# Patient Record
Sex: Male | Born: 1965 | Race: Black or African American | Hispanic: No | Marital: Married | Smoking: Current some day smoker
Health system: Southern US, Community
[De-identification: ages and names within clinical notes are randomized; demographics above are authoritative.]

## PROBLEM LIST (undated history)

## (undated) DIAGNOSIS — Z803 Family history of malignant neoplasm of breast: Secondary | ICD-10-CM

## (undated) DIAGNOSIS — C259 Malignant neoplasm of pancreas, unspecified: Secondary | ICD-10-CM

## (undated) DIAGNOSIS — E119 Type 2 diabetes mellitus without complications: Secondary | ICD-10-CM

## (undated) DIAGNOSIS — I10 Essential (primary) hypertension: Secondary | ICD-10-CM

## (undated) DIAGNOSIS — G893 Neoplasm related pain (acute) (chronic): Secondary | ICD-10-CM

## (undated) DIAGNOSIS — K219 Gastro-esophageal reflux disease without esophagitis: Secondary | ICD-10-CM

## (undated) DIAGNOSIS — N200 Calculus of kidney: Secondary | ICD-10-CM

## (undated) DIAGNOSIS — Z87442 Personal history of urinary calculi: Secondary | ICD-10-CM

## (undated) DIAGNOSIS — J45909 Unspecified asthma, uncomplicated: Secondary | ICD-10-CM

## (undated) HISTORY — PX: OTHER SURGICAL HISTORY: SHX169

## (undated) HISTORY — DX: Neoplasm related pain (acute) (chronic): G89.3

## (undated) HISTORY — PX: ROTATOR CUFF REPAIR: SHX139

## (undated) HISTORY — DX: Calculus of kidney: N20.0

## (undated) HISTORY — PX: FRACTURE SURGERY: SHX138

## (undated) HISTORY — DX: Unspecified asthma, uncomplicated: J45.909

## (undated) HISTORY — DX: Family history of malignant neoplasm of breast: Z80.3

---

## 2005-06-16 ENCOUNTER — Emergency Department: Payer: Self-pay | Admitting: Emergency Medicine

## 2005-06-18 ENCOUNTER — Ambulatory Visit: Payer: Self-pay | Admitting: Emergency Medicine

## 2007-06-01 ENCOUNTER — Emergency Department: Payer: Self-pay | Admitting: Emergency Medicine

## 2010-08-03 ENCOUNTER — Emergency Department: Payer: Self-pay | Admitting: Emergency Medicine

## 2011-10-30 ENCOUNTER — Ambulatory Visit: Payer: Self-pay | Admitting: Urology

## 2012-11-15 ENCOUNTER — Emergency Department (HOSPITAL_COMMUNITY): Payer: 59

## 2012-11-15 ENCOUNTER — Encounter (HOSPITAL_COMMUNITY): Payer: Self-pay

## 2012-11-15 ENCOUNTER — Emergency Department (HOSPITAL_COMMUNITY)
Admission: EM | Admit: 2012-11-15 | Discharge: 2012-11-15 | Disposition: A | Discharge status: Home / Self Care | Payer: 59 | Attending: Emergency Medicine | Admitting: Emergency Medicine

## 2012-11-15 DIAGNOSIS — F17200 Nicotine dependence, unspecified, uncomplicated: Secondary | ICD-10-CM | POA: Insufficient documentation

## 2012-11-15 DIAGNOSIS — R059 Cough, unspecified: Secondary | ICD-10-CM | POA: Insufficient documentation

## 2012-11-15 DIAGNOSIS — Z79899 Other long term (current) drug therapy: Secondary | ICD-10-CM | POA: Insufficient documentation

## 2012-11-15 DIAGNOSIS — R509 Fever, unspecified: Secondary | ICD-10-CM | POA: Insufficient documentation

## 2012-11-15 DIAGNOSIS — J069 Acute upper respiratory infection, unspecified: Secondary | ICD-10-CM | POA: Insufficient documentation

## 2012-11-15 DIAGNOSIS — J3489 Other specified disorders of nose and nasal sinuses: Secondary | ICD-10-CM | POA: Insufficient documentation

## 2012-11-15 DIAGNOSIS — R05 Cough: Secondary | ICD-10-CM | POA: Insufficient documentation

## 2012-11-15 DIAGNOSIS — I10 Essential (primary) hypertension: Secondary | ICD-10-CM | POA: Insufficient documentation

## 2012-11-15 DIAGNOSIS — K219 Gastro-esophageal reflux disease without esophagitis: Secondary | ICD-10-CM | POA: Insufficient documentation

## 2012-11-15 HISTORY — DX: Essential (primary) hypertension: I10

## 2012-11-15 HISTORY — DX: Gastro-esophageal reflux disease without esophagitis: K21.9

## 2012-11-15 MED ORDER — GUAIFENESIN 100 MG/5ML PO SYRP
200.0000 mg | ORAL_SOLUTION | ORAL | Status: DC | PRN
  Filled 2012-11-15: qty 118

## 2012-11-15 MED ORDER — HYDROCODONE-HOMATROPINE 5-1.5 MG/5ML PO SYRP: 5.0000 mL | ORAL_SOLUTION | Freq: Four times a day (QID) | ORAL | Status: DC | PRN

## 2012-11-15 MED ORDER — GUAIFENESIN 100 MG/5ML PO SOLN
10.0000 mL | ORAL | Status: DC | PRN
  Administered 2012-11-15: 200 mg via ORAL
  Filled 2012-11-15: qty 10

## 2012-11-15 NOTE — ED Provider Notes (Signed)
Medical screening examination/treatment/procedure(s) were performed by non-physician practitioner and as supervising physician I was immediately available for consultation/collaboration.  Ethanael Veith, MD 11/15/12 1552 

## 2012-11-15 NOTE — ED Provider Notes (Signed)
History     CSN: [id number]  Arrival date & time 11/15/12  1204   First MD Initiated Contact with Patient 11/15/12 1234      Chief Complaint  Patient presents with  . Sore Throat  . Cough    (Consider location/radiation/quality/duration/timing/severity/associated sxs/prior treatment) Patient is a 47 y.o. male presenting with URI. The history is provided by the patient.  URI Presenting symptoms: congestion, cough, fever (tactile, no temp taken ), rhinorrhea and sore throat   Presenting symptoms: no ear pain and no facial pain   Severity:  Moderate Onset quality:  Gradual Duration:  2 days Timing:  Constant Progression:  Unchanged Chronicity:  New Relieved by:  Nothing Worsened by:  Nothing tried Ineffective treatments:  None tried Associated symptoms: no arthralgias, no headaches, no myalgias, no neck pain, no sinus pain, no sneezing, no swollen glands and no wheezing   Risk factors: sick contacts   Risk factors: no chronic kidney disease, no diabetes mellitus, no immunosuppression, no recent illness and no recent travel     Past Medical History  Diagnosis Date  . Hypertension   . Acid reflux     Past Surgical History  Procedure Laterality Date  . Fracture surgery    . Pelvis fractur      Family History  Problem Relation Age of Onset  . Cancer Sister   . Diabetes Brother   . Hypertension Brother     History  Substance Use Topics  . Smoking status: Current Every Day Smoker -- 1.00 packs/day    Types: Cigarettes  . Smokeless tobacco: Never Used  . Alcohol Use: Yes     Comment: weekends only      Review of Systems  Constitutional: Positive for fever (tactile, no temp taken ).  HENT: Positive for congestion, sore throat and rhinorrhea. Negative for ear pain, sneezing and neck pain.   Respiratory: Positive for cough. Negative for wheezing.   Musculoskeletal: Negative for myalgias and arthralgias.  Neurological: Negative for headaches.  All other systems  reviewed and are negative.    Allergies  Review of patient's allergies indicates no known allergies.  Home Medications   Current Outpatient Rx  Name  Route  Sig  Dispense  Refill  . ibuprofen (ADVIL,MOTRIN) 200 MG tablet   Oral   Take 800 mg by mouth daily as needed for pain.         Marland Kitchen omeprazole (PRILOSEC) 20 MG capsule   Oral   Take 20 mg by mouth daily.           BP 158/112  Pulse 87  Temp(Src) 98.3 F (36.8 C) (Oral)  Resp 16  SpO2 95%  Physical Exam  Nursing note and vitals reviewed. Constitutional: He is oriented to person, place, and time. He appears well-developed and well-nourished. No distress.  Hypertension  HENT:  Head: Normocephalic and atraumatic.  Nasal congestion & rhinorrhea. No ttp over frontal & maxilla sinuses. Normal L & R external ear canals and TMs. No tragal or mastoid tenderness. Oropharynx moist, without tonsillar exudate.   Eyes: Pupils are equal, round, and reactive to light.  No pain w EOM. Conjunctiva normal    Neck: Normal range of motion.  Soft, no nuchal rigidity. No lymphadenopathy  Cardiovascular: Normal heart sounds and intact distal pulses.   RRR, no aberrancy on auscultation  Pulmonary/Chest: Effort normal.  LCAB, no respiratory distress  Musculoskeletal: Normal range of motion.  Neurological: He is alert and oriented to person, place, and time.  Skin: He is not diaphoretic.  No rash  Psychiatric: His behavior is normal.    ED Course  Procedures (including critical care time)  Labs Reviewed - No data to display Dg Chest 2 View  11/15/2012  *RADIOLOGY REPORT*  Clinical Data: Cough, chest pain and fever  CHEST - 2 VIEW  Comparison: None.  Findings: The heart and pulmonary vascularity are within normal limits.  The lungs are clear bilaterally.  No pneumothorax or effusion is noted.  No acute abnormalities seen.  IMPRESSION: No acute abnormality noted.   Original Report Authenticated By: Alcide Clever, M.D.      No  diagnosis found.    MDM  Pt CXR negative for acute infiltrate. Patients symptoms are consistent with URI, likely viral etiology. Discussed that antibiotics are not indicated for viral infections. Pt will be discharged with symptomatic treatment.  Verbalizes understanding and is agreeable with plan. Pt is hemodynamically stable & in NAD prior to dc.Discussed pts high blood pressure and advised re-check this week with PCP         Jaci Carrel, PA-C 11/15/12 1308

## 2012-11-15 NOTE — ED Notes (Signed)
Patient reports that he has a sore throat and a Productive cough with green sputum. Patient reports that he has had chills.

## 2012-11-15 NOTE — ED Notes (Signed)
Pt states that he is suppose to take BP medications but is not currently taken them at them moment says the medications makes him weak.

## 2013-05-18 ENCOUNTER — Emergency Department (HOSPITAL_COMMUNITY): Payer: 59

## 2013-05-18 ENCOUNTER — Encounter (HOSPITAL_COMMUNITY): Payer: Self-pay | Admitting: Emergency Medicine

## 2013-05-18 ENCOUNTER — Emergency Department (HOSPITAL_COMMUNITY)
Admission: EM | Admit: 2013-05-18 | Discharge: 2013-05-18 | Disposition: A | Discharge status: Home / Self Care | Payer: 59 | Attending: Emergency Medicine | Admitting: Emergency Medicine

## 2013-05-18 DIAGNOSIS — M545 Low back pain, unspecified: Secondary | ICD-10-CM | POA: Insufficient documentation

## 2013-05-18 DIAGNOSIS — F17200 Nicotine dependence, unspecified, uncomplicated: Secondary | ICD-10-CM | POA: Insufficient documentation

## 2013-05-18 DIAGNOSIS — I10 Essential (primary) hypertension: Secondary | ICD-10-CM | POA: Insufficient documentation

## 2013-05-18 DIAGNOSIS — Z79899 Other long term (current) drug therapy: Secondary | ICD-10-CM | POA: Insufficient documentation

## 2013-05-18 DIAGNOSIS — K219 Gastro-esophageal reflux disease without esophagitis: Secondary | ICD-10-CM | POA: Insufficient documentation

## 2013-05-18 LAB — COMPREHENSIVE METABOLIC PANEL
AST: 26 U/L (ref 0–37)
Albumin: 3.8 g/dL (ref 3.5–5.2)
BUN: 13 mg/dL (ref 6–23)
Chloride: 102 mEq/L (ref 96–112)
Creatinine, Ser: 0.8 mg/dL (ref 0.50–1.35)
Potassium: 3.7 mEq/L (ref 3.5–5.1)
Total Bilirubin: 0.3 mg/dL (ref 0.3–1.2)
Total Protein: 7.1 g/dL (ref 6.0–8.3)

## 2013-05-18 LAB — CBC WITH DIFFERENTIAL/PLATELET
Basophils Absolute: 0 10*3/uL (ref 0.0–0.1)
Basophils Relative: 0 % (ref 0–1)
Eosinophils Absolute: 0.3 10*3/uL (ref 0.0–0.7)
MCH: 28.4 pg (ref 26.0–34.0)
MCHC: 35.2 g/dL (ref 30.0–36.0)
Monocytes Absolute: 0.7 10*3/uL (ref 0.1–1.0)
Neutro Abs: 2.9 10*3/uL (ref 1.7–7.7)
Neutrophils Relative %: 44 % (ref 43–77)
RDW: 13.4 % (ref 11.5–15.5)

## 2013-05-18 LAB — URINALYSIS, ROUTINE W REFLEX MICROSCOPIC
Bilirubin Urine: NEGATIVE
Ketones, ur: NEGATIVE mg/dL
Leukocytes, UA: NEGATIVE
Nitrite: NEGATIVE
Protein, ur: NEGATIVE mg/dL

## 2013-05-18 LAB — LIPASE, BLOOD: Lipase: 58 U/L (ref 11–59)

## 2013-05-18 MED ORDER — FENTANYL CITRATE 0.05 MG/ML IJ SOLN
50.0000 ug | INTRAMUSCULAR | Status: DC | PRN
  Administered 2013-05-18: 50 ug via INTRAVENOUS
  Filled 2013-05-18: qty 2

## 2013-05-18 MED ORDER — CYCLOBENZAPRINE HCL 10 MG PO TABS
5.0000 mg | ORAL_TABLET | Freq: Once | ORAL | Status: AC
  Administered 2013-05-18: 5 mg via ORAL
  Filled 2013-05-18: qty 1

## 2013-05-18 MED ORDER — IBUPROFEN 800 MG PO TABS: 800.0000 mg | ORAL_TABLET | Freq: Three times a day (TID) | ORAL | Status: DC

## 2013-05-18 MED ORDER — CYCLOBENZAPRINE HCL 10 MG PO TABS: 10.0000 mg | ORAL_TABLET | Freq: Two times a day (BID) | ORAL | Status: DC | PRN

## 2013-05-18 MED ORDER — TRAMADOL HCL 50 MG PO TABS
50.0000 mg | ORAL_TABLET | Freq: Four times a day (QID) | ORAL | Status: DC | PRN
Start: 2013-05-18 — End: 2014-05-17

## 2013-05-18 NOTE — ED Notes (Signed)
Pt presents to ED with c/o bilateral flank pain that radiates down to both legs; pt reports that he woke up yesterday with sudden pain that is getting progressively worse, "worst" pain at this time; pain also gets worse on movement, especially when bending. Pt denies nausea or vomiting. Pt reports hx of kidney stones. Pt also c/o lower abdominal pain; reports that he has been taking Doxycycline (for a course of 10 days) for STD since last week.

## 2013-05-18 NOTE — ED Provider Notes (Signed)
CSN: [id number]     Arrival date & time 05/18/13  0037 History   First MD Initiated Contact with Patient 05/18/13 [phone]     Chief Complaint  Patient presents with  . Flank Pain   (Consider location/radiation/quality/duration/timing/severity/associated sxs/prior Treatment) HPI History provided by patient. Right-sided lower back pain radiating down right leg onset about a week ago. Pain sharp/ moderate in severity and worse with bending or movement. No associated weakness or numbness. No hematuria. No flank pain. No abdominal pain. No fevers or chills. No associated nausea vomiting or diarrhea. Patient is currently taking doxycycline for possible STD - he was evaluated at an urgent care for dysuria which is improving. No penile discharge. No rectal pain.  Past Medical History  Diagnosis Date  . Hypertension   . Acid reflux    Past Surgical History  Procedure Laterality Date  . Fracture surgery    . Pelvis fractur     Family History  Problem Relation Age of Onset  . Cancer Sister   . Diabetes Brother   . Hypertension Brother    History  Substance Use Topics  . Smoking status: Current Every Day Smoker -- 1.00 packs/day    Types: Cigarettes  . Smokeless tobacco: Never Used  . Alcohol Use: Yes     Comment: weekends only    Review of Systems  Constitutional: Negative for fever and chills.  HENT: Negative for neck pain and neck stiffness.   Eyes: Negative for pain.  Respiratory: Negative for shortness of breath.   Cardiovascular: Negative for chest pain.  Gastrointestinal: Negative for abdominal pain.  Genitourinary: Negative for flank pain.  Musculoskeletal: Positive for back pain.  Skin: Negative for rash.  Neurological: Negative for headaches.  All other systems reviewed and are negative.    Allergies  Review of patient's allergies indicates no known allergies.  Home Medications   Current Outpatient Rx  Name  Route  Sig  Dispense  Refill  . amLODipine (NORVASC) 10  MG tablet   Oral   Take 10 mg by mouth daily.         Marland Kitchen doxycycline (VIBRA-TABS) 100 MG tablet   Oral   Take 100 mg by mouth 2 (two) times daily.         Marland Kitchen ibuprofen (ADVIL,MOTRIN) 200 MG tablet   Oral   Take 800 mg by mouth daily as needed for pain.         Marland Kitchen omeprazole (PRILOSEC) 20 MG capsule   Oral   Take 20 mg by mouth daily.          BP 155/97  Pulse 84  Temp(Src) 98.3 F (36.8 C) (Oral)  Resp 20  Ht 6' (1.829 m)  Wt 230 lb (104.327 kg)  BMI 31.19 kg/m2  SpO2 98% Physical Exam  Constitutional: He is oriented to person, place, and time. He appears well-developed and well-nourished.  HENT:  Head: Normocephalic and atraumatic.  Eyes: EOM are normal. Pupils are equal, round, and reactive to light.  Neck: Neck supple.  Cardiovascular: Normal rate, regular rhythm and intact distal pulses.   Pulmonary/Chest: Effort normal and breath sounds normal. No respiratory distress.  Abdominal: Soft. Bowel sounds are normal. He exhibits no distension. There is no tenderness.  Musculoskeletal: Normal range of motion. He exhibits no edema.  Tender right para lumbar region without midline tenderness or deformity. No CVA tenderness. No lower extremity deficits with equal dorsi plantar flexion, hip flexion and knee extension. Sensorium to light touch equal and  intact throughout. Equal dorsalis pedis pulses  Neurological: He is alert and oriented to person, place, and time.  Skin: Skin is warm and dry.    ED Course  Procedures (including critical care time) Labs Review Labs Reviewed  COMPREHENSIVE METABOLIC PANEL - Abnormal; Notable for the following:    Glucose, Bld 125 (*)    All other components within normal limits  URINALYSIS, ROUTINE W REFLEX MICROSCOPIC  CBC WITH DIFFERENTIAL  LIPASE, BLOOD   Imaging Review Ct Abdomen Pelvis Wo Contrast  05/18/2013   CLINICAL DATA:  Bilateral flank and lower abdominal pain. History of urinary tract stones.  EXAM: CT ABDOMEN AND  PELVIS WITHOUT CONTRAST  TECHNIQUE: Multidetector CT imaging of the abdomen and pelvis was performed following the standard protocol without intravenous contrast.  COMPARISON:  None.  FINDINGS: The lung bases are clear.  No pleural or pericardial effusion.  There are no renal or ureteral stones on the right or left. The kidneys have a normal uninfused appearance. The gallbladder, liver, spleen, adrenal glands and pancreas appear normal. The urinary bladder, prostate gland and seminal vesicles are unremarkable. There is few colonic diverticula without diverticulitis. The stomach, small bowel and appendix appear normal. No lymphadenopathy or fluid is identified. There is no focal bony abnormality.  IMPRESSION: Negative for urinary tract stone. No acute finding.  Mild diverticulosis without diverticulitis.   Electronically Signed   By: Drusilla Kanner M.D.   On: 05/18/2013 02:30   IV fentanyl, Zofran. Flexeril provided On recheck pain significantly improved. Ambulates no acute distress without neuro deficits. Plan discharge home with outpatient followup for low back pain. Prescriptions provided for Ultram, Flexeril, Motrin. Back pain precautions verbalized as understood. No indication for emergent MRI at this time. He works in a [employer] and does do some lifting. She declines work note / prefers to go back to work, agrees to all discharge and followup instructions including no heavy lifting until his symptoms are improved.  MDM  Diagnosis: Low back pain  Labs and imaging obtained and reviewed Improved with medications Vital signs and nursing notes reviewed    Sunnie Nielsen, MD 05/18/13 [phone]

## 2013-05-19 ENCOUNTER — Telehealth (HOSPITAL_COMMUNITY): Payer: Self-pay | Admitting: Emergency Medicine

## 2013-06-07 ENCOUNTER — Emergency Department (HOSPITAL_COMMUNITY): Payer: 59

## 2013-06-07 ENCOUNTER — Encounter (HOSPITAL_COMMUNITY): Payer: Self-pay | Admitting: Emergency Medicine

## 2013-06-07 ENCOUNTER — Emergency Department (HOSPITAL_COMMUNITY)
Admission: EM | Admit: 2013-06-07 | Discharge: 2013-06-07 | Disposition: A | Discharge status: Home / Self Care | Payer: 59 | Attending: Emergency Medicine | Admitting: Emergency Medicine

## 2013-06-07 DIAGNOSIS — K219 Gastro-esophageal reflux disease without esophagitis: Secondary | ICD-10-CM | POA: Insufficient documentation

## 2013-06-07 DIAGNOSIS — Z79899 Other long term (current) drug therapy: Secondary | ICD-10-CM | POA: Insufficient documentation

## 2013-06-07 DIAGNOSIS — I10 Essential (primary) hypertension: Secondary | ICD-10-CM | POA: Insufficient documentation

## 2013-06-07 DIAGNOSIS — F17200 Nicotine dependence, unspecified, uncomplicated: Secondary | ICD-10-CM | POA: Insufficient documentation

## 2013-06-07 DIAGNOSIS — R0789 Other chest pain: Secondary | ICD-10-CM | POA: Insufficient documentation

## 2013-06-07 LAB — CBC WITH DIFFERENTIAL/PLATELET
Eosinophils Relative: 4 % (ref 0–5)
HCT: 38.6 % — ABNORMAL LOW (ref 39.0–52.0)
Hemoglobin: 13.7 g/dL (ref 13.0–17.0)
Lymphocytes Relative: 38 % (ref 12–46)
Lymphs Abs: 2.1 10*3/uL (ref 0.7–4.0)
MCV: 80.6 fL (ref 78.0–100.0)
Monocytes Absolute: 0.6 10*3/uL (ref 0.1–1.0)
Monocytes Relative: 12 % (ref 3–12)
Neutro Abs: 2.5 10*3/uL (ref 1.7–7.7)
RDW: 13.3 % (ref 11.5–15.5)
WBC: 5.4 10*3/uL (ref 4.0–10.5)

## 2013-06-07 LAB — URINALYSIS, ROUTINE W REFLEX MICROSCOPIC
Bilirubin Urine: NEGATIVE
Hgb urine dipstick: NEGATIVE
Specific Gravity, Urine: >1.046 — ABNORMAL HIGH (ref 1.005–1.030)
Urobilinogen, UA: 0.2 mg/dL (ref 0.0–1.0)
pH: 5.5 (ref 5.0–8.0)

## 2013-06-07 LAB — BASIC METABOLIC PANEL
BUN: 11 mg/dL (ref 6–23)
CO2: 22 mEq/L (ref 19–32)
Calcium: 9.6 mg/dL (ref 8.4–10.5)
Chloride: 102 mEq/L (ref 96–112)
Creatinine, Ser: 0.78 mg/dL (ref 0.50–1.35)
Glucose, Bld: 125 mg/dL — ABNORMAL HIGH (ref 70–99)

## 2013-06-07 LAB — D-DIMER, QUANTITATIVE: D-Dimer, Quant: <0.27 ug/mL-FEU (ref 0.00–0.48)

## 2013-06-07 MED ORDER — MORPHINE SULFATE 4 MG/ML IJ SOLN
6.0000 mg | Freq: Once | INTRAMUSCULAR | Status: AC
  Administered 2013-06-07: 6 mg via INTRAVENOUS
  Filled 2013-06-07: qty 2

## 2013-06-07 MED ORDER — OXYCODONE-ACETAMINOPHEN 5-325 MG PO TABS: 1.0000 | ORAL_TABLET | ORAL | Status: DC | PRN

## 2013-06-07 MED ORDER — IOHEXOL 300 MG/ML  SOLN
80.0000 mL | Freq: Once | INTRAMUSCULAR | Status: AC | PRN
  Administered 2013-06-07: 80 mL via INTRAVENOUS

## 2013-06-07 NOTE — ED Notes (Signed)
Patient transported to X-ray 

## 2013-06-07 NOTE — ED Notes (Addendum)
Pt c/o left sided pain that's been going on for about 1 month and states he noticed veins popping up about 3 weeks ago. Was seen last month for same symptoms and preseribed tramadol.

## 2013-06-07 NOTE — ED Provider Notes (Signed)
CSN: [id number]     Arrival date & time 06/07/13  1153 History   First MD Initiated Contact with Patient 06/07/13 1205     Chief Complaint  Patient presents with  . left side pain     HPI Patient reports approximately 3-4 weeks of ongoing left-sided chest wall tenderness.  He states he feels an abnormality on his left lateral chest wall that feels like an engorged blood vessels to him.  No history of DVT or pulmonary embolism.  He does have worsening of his pain with deep breathing.  He also has worsening pain when he moves his left arm above his head or rolls over in bed.  He tried pain medicine without improvement in his symptoms.  His pain is constant.  No unilateral leg swelling.  No cough or congestion.  No melena or hematochezia described.  Nothing improves his pain    Past Medical History  Diagnosis Date  . Hypertension   . Acid reflux    Past Surgical History  Procedure Laterality Date  . Fracture surgery    . Pelvis fractur     Family History  Problem Relation Age of Onset  . Cancer Sister   . Diabetes Brother   . Hypertension Brother    History  Substance Use Topics  . Smoking status: Current Every Day Smoker -- 1.00 packs/day    Types: Cigarettes  . Smokeless tobacco: Never Used  . Alcohol Use: Yes     Comment: weekends only    Review of Systems  All other systems reviewed and are negative.    Allergies  Review of patient's allergies indicates no known allergies.  Home Medications   Current Outpatient Rx  Name  Route  Sig  Dispense  Refill  . amLODipine (NORVASC) 10 MG tablet   Oral   Take 10 mg by mouth daily.         Marland Kitchen ibuprofen (ADVIL,MOTRIN) 200 MG tablet   Oral   Take 800 mg by mouth daily as needed for pain.         Marland Kitchen ibuprofen (ADVIL,MOTRIN) 800 MG tablet   Oral   Take 1 tablet (800 mg total) by mouth 3 (three) times daily.   21 tablet   0   . omeprazole (PRILOSEC) 20 MG capsule   Oral   Take 20 mg by mouth daily.         .  traMADol (ULTRAM) 50 MG tablet   Oral   Take 1 tablet (50 mg total) by mouth every 6 (six) hours as needed for pain.   15 tablet   0   . oxyCODONE-acetaminophen (PERCOCET/ROXICET) 5-325 MG per tablet   Oral   Take 1 tablet by mouth every 4 (four) hours as needed for pain.   20 tablet   0    BP 136/91  Pulse 73  Temp(Src) 98.1 F (36.7 C) (Oral)  Resp 18  SpO2 97% Physical Exam  Nursing note and vitals reviewed. Constitutional: He is oriented to person, place, and time. He appears well-developed and well-nourished.  HENT:  Head: Normocephalic and atraumatic.  Eyes: EOM are normal.  Neck: Normal range of motion.  Cardiovascular: Normal rate, regular rhythm, normal heart sounds and intact distal pulses.   Pulmonary/Chest: Effort normal and breath sounds normal. No respiratory distress.  Left lateral chest wall tenderness and there is some sense of fullness on the inferior aspect of that lateral chest wall on the left.  No overlying erythema or skin  changes.  No ecchymosis.  Abdominal: Soft. He exhibits no distension. There is no tenderness.  Musculoskeletal: Normal range of motion. He exhibits no edema and no tenderness.  Neurological: He is alert and oriented to person, place, and time.  Skin: Skin is warm and dry.  Psychiatric: He has a normal mood and affect. Judgment normal.    ED Course  Procedures (including critical care time) Labs Review Labs Reviewed  CBC WITH DIFFERENTIAL - Abnormal; Notable for the following:    HCT 38.6 (*)    All other components within normal limits  BASIC METABOLIC PANEL - Abnormal; Notable for the following:    Glucose, Bld 125 (*)    All other components within normal limits  URINALYSIS, ROUTINE W REFLEX MICROSCOPIC - Abnormal; Notable for the following:    Specific Gravity, Urine >1.046 (*)    All other components within normal limits  D-DIMER, QUANTITATIVE  PRO B NATRIURETIC PEPTIDE   Imaging Review Dg Chest 2 View  06/07/2013    CLINICAL DATA:  Chest pain and shortness of Breath  EXAM: CHEST  2 VIEW  COMPARISON:  November 15, 2012  FINDINGS: The lungs are clear. Heart size and pulmonary vascularity are normal. No adenopathy. No pneumothorax. No bone lesions.  IMPRESSION: No abnormality noted.   Electronically Signed   By: Bretta Bang M.D.   On: 06/07/2013 13:07   Ct Chest W Contrast  06/07/2013   CLINICAL DATA:  Left lateral chest wall pain and swelling, no injury  EXAM: CT CHEST WITH CONTRAST  TECHNIQUE: Multidetector CT imaging of the chest was performed during intravenous contrast administration.  CONTRAST:  80mL OMNIPAQUE IOHEXOL 300 MG/ML  SOLN  COMPARISON:  Chest x-ray of 06/07/2013  FINDINGS: There is bibasilar dependent atelectasis. However no pneumonia or effusion is seen. Minimal stranding this is present in the superior segment of the right lower lobe developing pneumonia would be difficult to exclude.  On soft tissue window images, the thyroid gland is within normal limits in size. The thoracic aorta opacifies with no acute abnormality and the origins of the great vessels are patent. Only small mediastinal lymph nodes are present. The pulmonary arteries are not as well opacified but no significant abnormality is seen. The portion of the upper abdomen that is visualized is unremarkable. The spleen appears intact. No rib fracture is seen. The thoracic vertebrae are in normal alignment.  IMPRESSION: 1. No explanation for the patient's left lateral chest wall pain is seen. 2. Bibasilar dependent atelectasis. Probable atelectasis in the superior segment of the right lower lobe. Consider followup if symptoms persist or worsen.   Electronically Signed   By: Dwyane Dee M.D.   On: 06/07/2013 15:13  I personally reviewed the imaging tests through PACS system I reviewed available ER/hospitalization records through the EMR   EKG Interpretation   None       MDM   1. Left-sided chest wall pain    Given the patient's  ongoing symptoms a CT scan was obtained which did not demonstrate any significant abnormalities in the soft tissue windows.  No abnormalities of his left ribs.  No evidence of airspace disease on the left.  IDW the patient has pneumonia.  D-dimer was negative.  Patient be referred to sports medicine as it this may represent musculoskeletal type pain at the attachment of some muscles.  I recommended pain control and ice/heat    Lyanne Co, MD 06/07/13 1616

## 2013-06-07 NOTE — ED Notes (Signed)
Patient transported to CT 

## 2013-06-07 NOTE — Progress Notes (Signed)
   CARE MANAGEMENT ED NOTE 06/07/2013  Patient:  Dan [name], Dan [name]   Account Number:  [id number]  Date Initiated:  06/07/2013  Documentation initiated by:  Edd Arbour  Subjective/Objective Assessment:   47 yr old male [facility] patient without pcp listed in EPIC c/o left sided pain for a month prescribed tramadol last month, chest pain and sob     Subjective/Objective Assessment Detail:     Action/Plan:   Cm spoke with pt who states he has an upcoming appointment on June 27 2013 with [facility] provider and believes on [address] [employer] updated   Action/Plan Detail:   Anticipated DC Date:  06/07/2013     Status Recommendation to Physician:   Result of Recommendation:    Other ED Services  Consult Working Plan    DC Planning Services  Other  PCP issues  Outpatient Services - Pt will follow up    Choice offered to / List presented to:            Status of service:  Completed, signed off  ED Comments:   ED Comments Detail:

## 2013-08-17 DIAGNOSIS — I10 Essential (primary) hypertension: Secondary | ICD-10-CM | POA: Insufficient documentation

## 2014-05-17 ENCOUNTER — Emergency Department (HOSPITAL_COMMUNITY): Payer: 59

## 2014-05-17 ENCOUNTER — Emergency Department (HOSPITAL_COMMUNITY)
Admission: EM | Admit: 2014-05-17 | Discharge: 2014-05-17 | Disposition: A | Discharge status: Home / Self Care | Payer: 59 | Attending: Emergency Medicine | Admitting: Emergency Medicine

## 2014-05-17 ENCOUNTER — Encounter (HOSPITAL_COMMUNITY): Payer: Self-pay | Admitting: Emergency Medicine

## 2014-05-17 DIAGNOSIS — I10 Essential (primary) hypertension: Secondary | ICD-10-CM | POA: Insufficient documentation

## 2014-05-17 DIAGNOSIS — Z79899 Other long term (current) drug therapy: Secondary | ICD-10-CM | POA: Diagnosis not present

## 2014-05-17 DIAGNOSIS — R51 Headache: Secondary | ICD-10-CM | POA: Diagnosis not present

## 2014-05-17 DIAGNOSIS — R059 Cough, unspecified: Secondary | ICD-10-CM | POA: Diagnosis present

## 2014-05-17 DIAGNOSIS — J40 Bronchitis, not specified as acute or chronic: Secondary | ICD-10-CM

## 2014-05-17 DIAGNOSIS — K219 Gastro-esophageal reflux disease without esophagitis: Secondary | ICD-10-CM | POA: Insufficient documentation

## 2014-05-17 DIAGNOSIS — R05 Cough: Secondary | ICD-10-CM | POA: Diagnosis present

## 2014-05-17 DIAGNOSIS — J209 Acute bronchitis, unspecified: Secondary | ICD-10-CM | POA: Diagnosis not present

## 2014-05-17 DIAGNOSIS — F17200 Nicotine dependence, unspecified, uncomplicated: Secondary | ICD-10-CM | POA: Diagnosis not present

## 2014-05-17 MED ORDER — AZITHROMYCIN 250 MG PO TABS: ORAL_TABLET | ORAL | Status: DC

## 2014-05-17 MED ORDER — AMLODIPINE BESYLATE 10 MG PO TABS: 10.0000 mg | ORAL_TABLET | Freq: Every day | ORAL | Status: DC

## 2014-05-17 MED ORDER — ALBUTEROL SULFATE (2.5 MG/3ML) 0.083% IN NEBU: 2.5000 mg | INHALATION_SOLUTION | Freq: Four times a day (QID) | RESPIRATORY_TRACT | Status: DC | PRN

## 2014-05-17 MED ORDER — PREDNISONE 20 MG PO TABS
60.0000 mg | ORAL_TABLET | Freq: Once | ORAL | Status: AC
  Administered 2014-05-17: 60 mg via ORAL
  Filled 2014-05-17: qty 3

## 2014-05-17 MED ORDER — ALBUTEROL SULFATE HFA 108 (90 BASE) MCG/ACT IN AERS
2.0000 | INHALATION_SPRAY | RESPIRATORY_TRACT | Status: DC
  Administered 2014-05-17: 2 via RESPIRATORY_TRACT
  Filled 2014-05-17: qty 6.7

## 2014-05-17 MED ORDER — PREDNISONE 10 MG PO TABS: 20.0000 mg | ORAL_TABLET | Freq: Every day | ORAL | Status: DC

## 2014-05-17 NOTE — Discharge Instructions (Signed)

## 2014-05-17 NOTE — Progress Notes (Signed)
Pt was given albuterol MDI by Respiratory with spacer. Pt knows and under stands how to use.

## 2014-05-17 NOTE — ED Notes (Addendum)
EDP ALLEN MADE AWARE OF ELEVATED BP. PT OUT OF MEDICATIONS X 2 WEEKS. EDP IN TO SEE PT. EDP ORDERED RX FOR BP AT DISCHARGE.Marland Kitchen CORRECTION RX X 4 GIVEN AT DISCHARGE.

## 2014-05-17 NOTE — ED Provider Notes (Signed)
CSN: [id number]     Arrival date & time 05/17/14  0093 History   First MD Initiated Contact with Patient 05/17/14 [phone]     Chief Complaint  Patient presents with  . Cough  . Headache     (Consider location/radiation/quality/duration/timing/severity/associated sxs/prior Treatment) HPI Comments: Patient here complaining of cough and congestion x1 week. Denies any fever or chills. No vomiting diarrhea. Mild frontal headache noted without neurological findings. Patient does smoke daily. Cough has been productive of green sputum. Notes slight dyspnea. No anginal type chest pain. Symptoms persisted and he has used over-the-counter medications without relief. Denies any night sweats or recent weight loss.  Patient is a 48 y.o. male presenting with cough and headaches. The history is provided by the patient.  Cough Associated symptoms: headaches   Headache Associated symptoms: cough     Past Medical History  Diagnosis Date  . Hypertension   . Acid reflux    Past Surgical History  Procedure Laterality Date  . Fracture surgery    . Pelvis fractur     Family History  Problem Relation Age of Onset  . Cancer Sister   . Diabetes Brother   . Hypertension Brother    History  Substance Use Topics  . Smoking status: Current Every Day Smoker -- 1.00 packs/day    Types: Cigarettes  . Smokeless tobacco: Never Used  . Alcohol Use: Yes     Comment: weekends only    Review of Systems  Respiratory: Positive for cough.   Neurological: Positive for headaches.  All other systems reviewed and are negative.     Allergies  Review of patient's allergies indicates no known allergies.  Home Medications   Prior to Admission medications   Medication Sig Start Date End Date Taking? Authorizing Provider  acetaminophen (TYLENOL) 325 MG tablet Take 650 mg by mouth every 6 (six) hours as needed (cold symptoms).   Yes Historical Provider, MD  amLODipine (NORVASC) 10 MG tablet Take 10 mg by mouth  daily.   Yes Historical Provider, MD  ibuprofen (ADVIL,MOTRIN) 200 MG tablet Take 800 mg by mouth daily as needed for headache (headache).    Yes Historical Provider, MD  omeprazole (PRILOSEC) 20 MG capsule Take 20 mg by mouth daily.   Yes Historical Provider, MD  oxyCODONE-acetaminophen (PERCOCET/ROXICET) 5-325 MG per tablet Take 1 tablet by mouth every 4 (four) hours as needed for moderate pain or severe pain (headache).   Yes Historical Provider, MD   BP 165/104  Pulse 81  Temp(Src) 98.2 F (36.8 C) (Oral)  Resp 16  SpO2 96% Physical Exam  Nursing note and vitals reviewed. Constitutional: He is oriented to person, place, and time. He appears well-developed and well-nourished.  Non-toxic appearance. No distress.  HENT:  Head: Normocephalic and atraumatic.  Eyes: Conjunctivae, EOM and lids are normal. Pupils are equal, round, and reactive to light.  Neck: Normal range of motion. Neck supple. No tracheal deviation present. No mass present.  Cardiovascular: Normal rate, regular rhythm and normal heart sounds.  Exam reveals no gallop.   No murmur heard. Pulmonary/Chest: Effort normal. No stridor. No respiratory distress. He has decreased breath sounds. He has wheezes. He has no rhonchi. He has no rales.  Abdominal: Soft. Normal appearance and bowel sounds are normal. He exhibits no distension. There is no tenderness. There is no rebound and no CVA tenderness.  Musculoskeletal: Normal range of motion. He exhibits no edema and no tenderness.  Neurological: He is alert and oriented to person,  place, and time. He has normal strength. No cranial nerve deficit or sensory deficit. GCS eye subscore is 4. GCS verbal subscore is 5. GCS motor subscore is 6.  Skin: Skin is warm and dry. No abrasion and no rash noted.  Psychiatric: He has a normal mood and affect. His speech is normal and behavior is normal.    ED Course  Procedures (including critical care time) Labs Review Labs Reviewed - No data  to display  Imaging Review No results found.   EKG Interpretation None      MDM   Final diagnoses:  None    Patient given albuterol puffs with prednisone and feels better. Be treated for bronchitis    Leota Jacobsen, MD 05/17/14 [phone]

## 2014-05-17 NOTE — ED Notes (Addendum)
Pt reports productive cough green sputum and headache 8/10 x1 week. Expiratory wheezing heard. Reports hx of HTN, ran out of medications a few weeks ago.

## 2014-06-04 IMAGING — CR DG CHEST 2V
2 series · 2 of 2 positions shown · non-contrast
Comparison: November 15, 2012

CLINICAL DATA: Chest pain and shortness of Breath

EXAM:
CHEST  2 VIEW

[w chest pa]
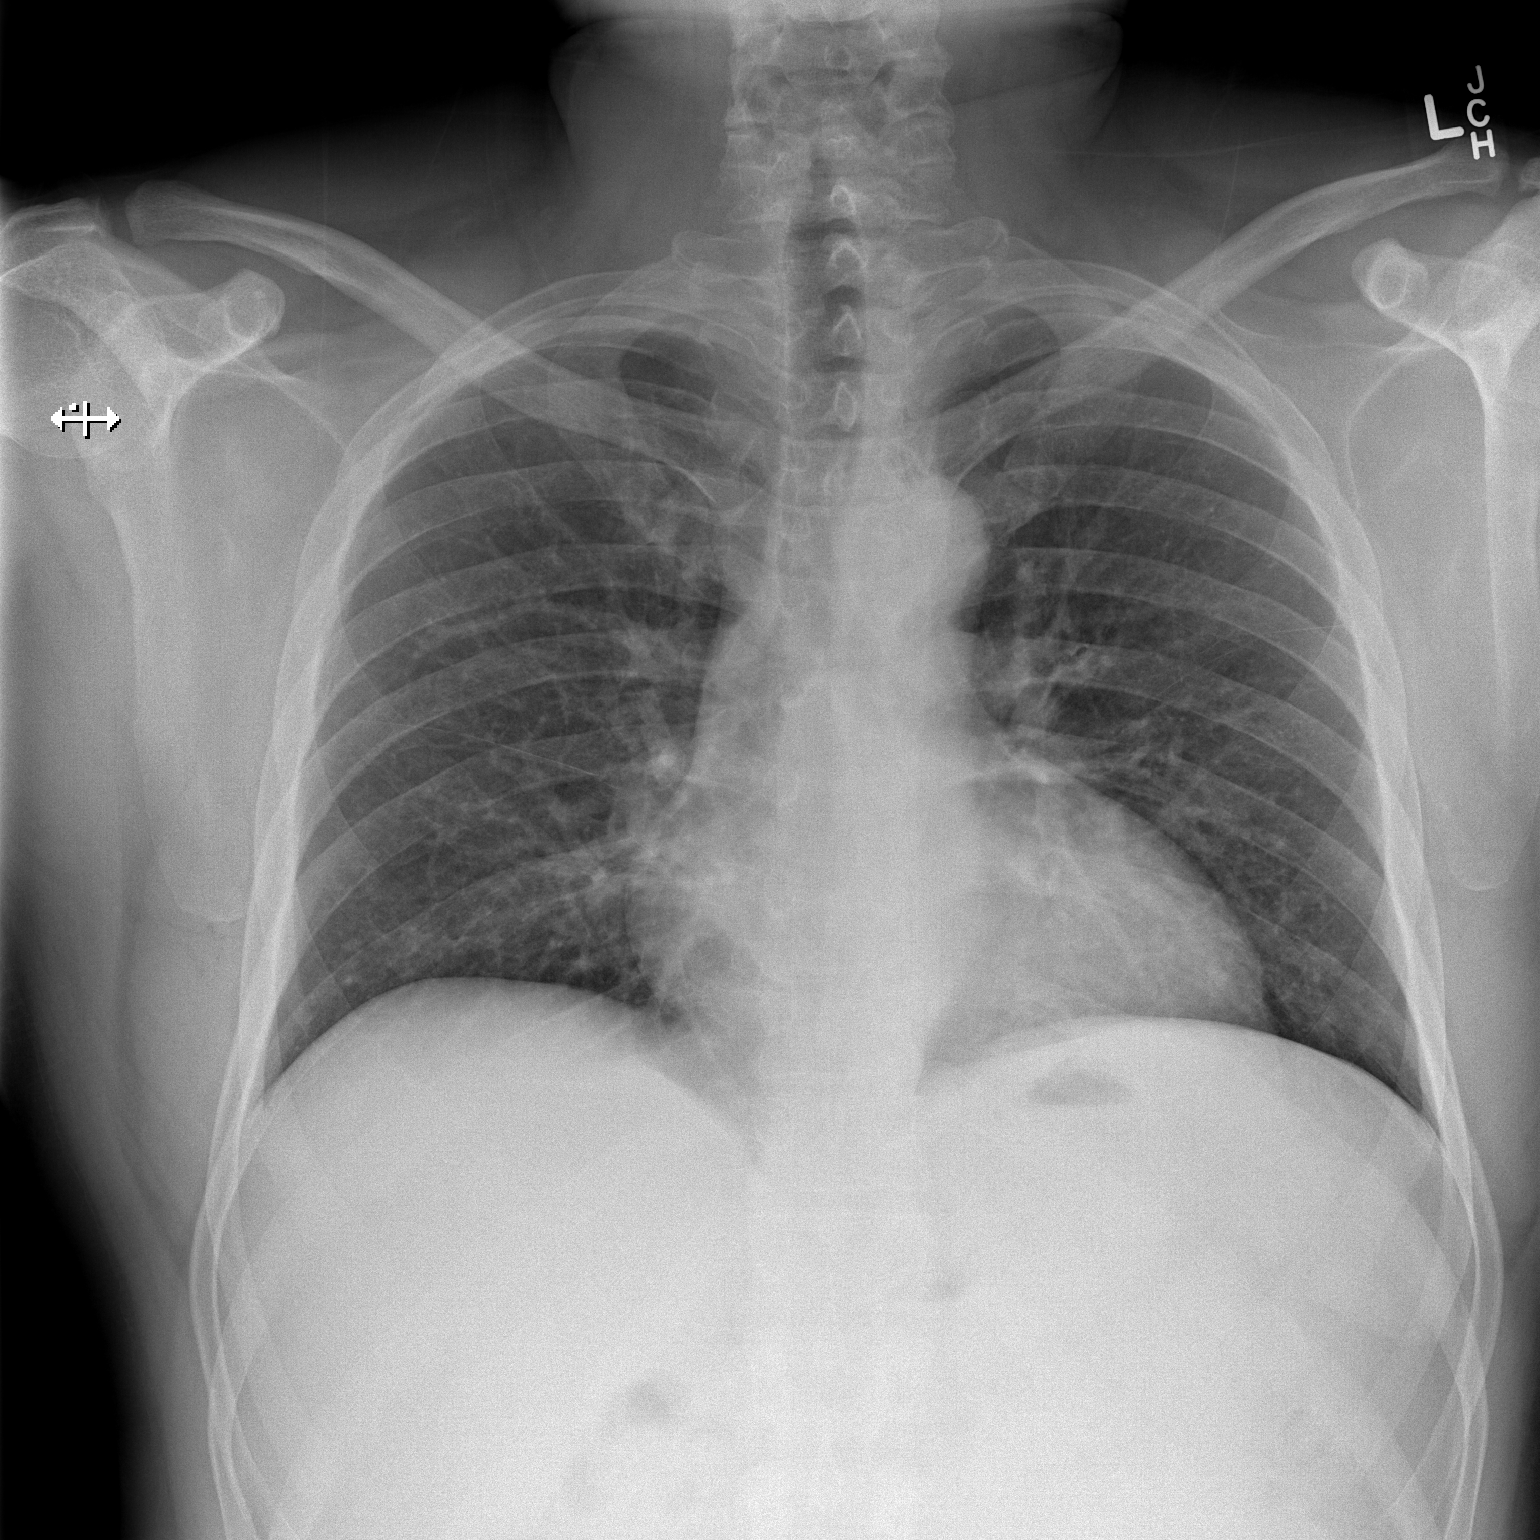

[w chest lat]
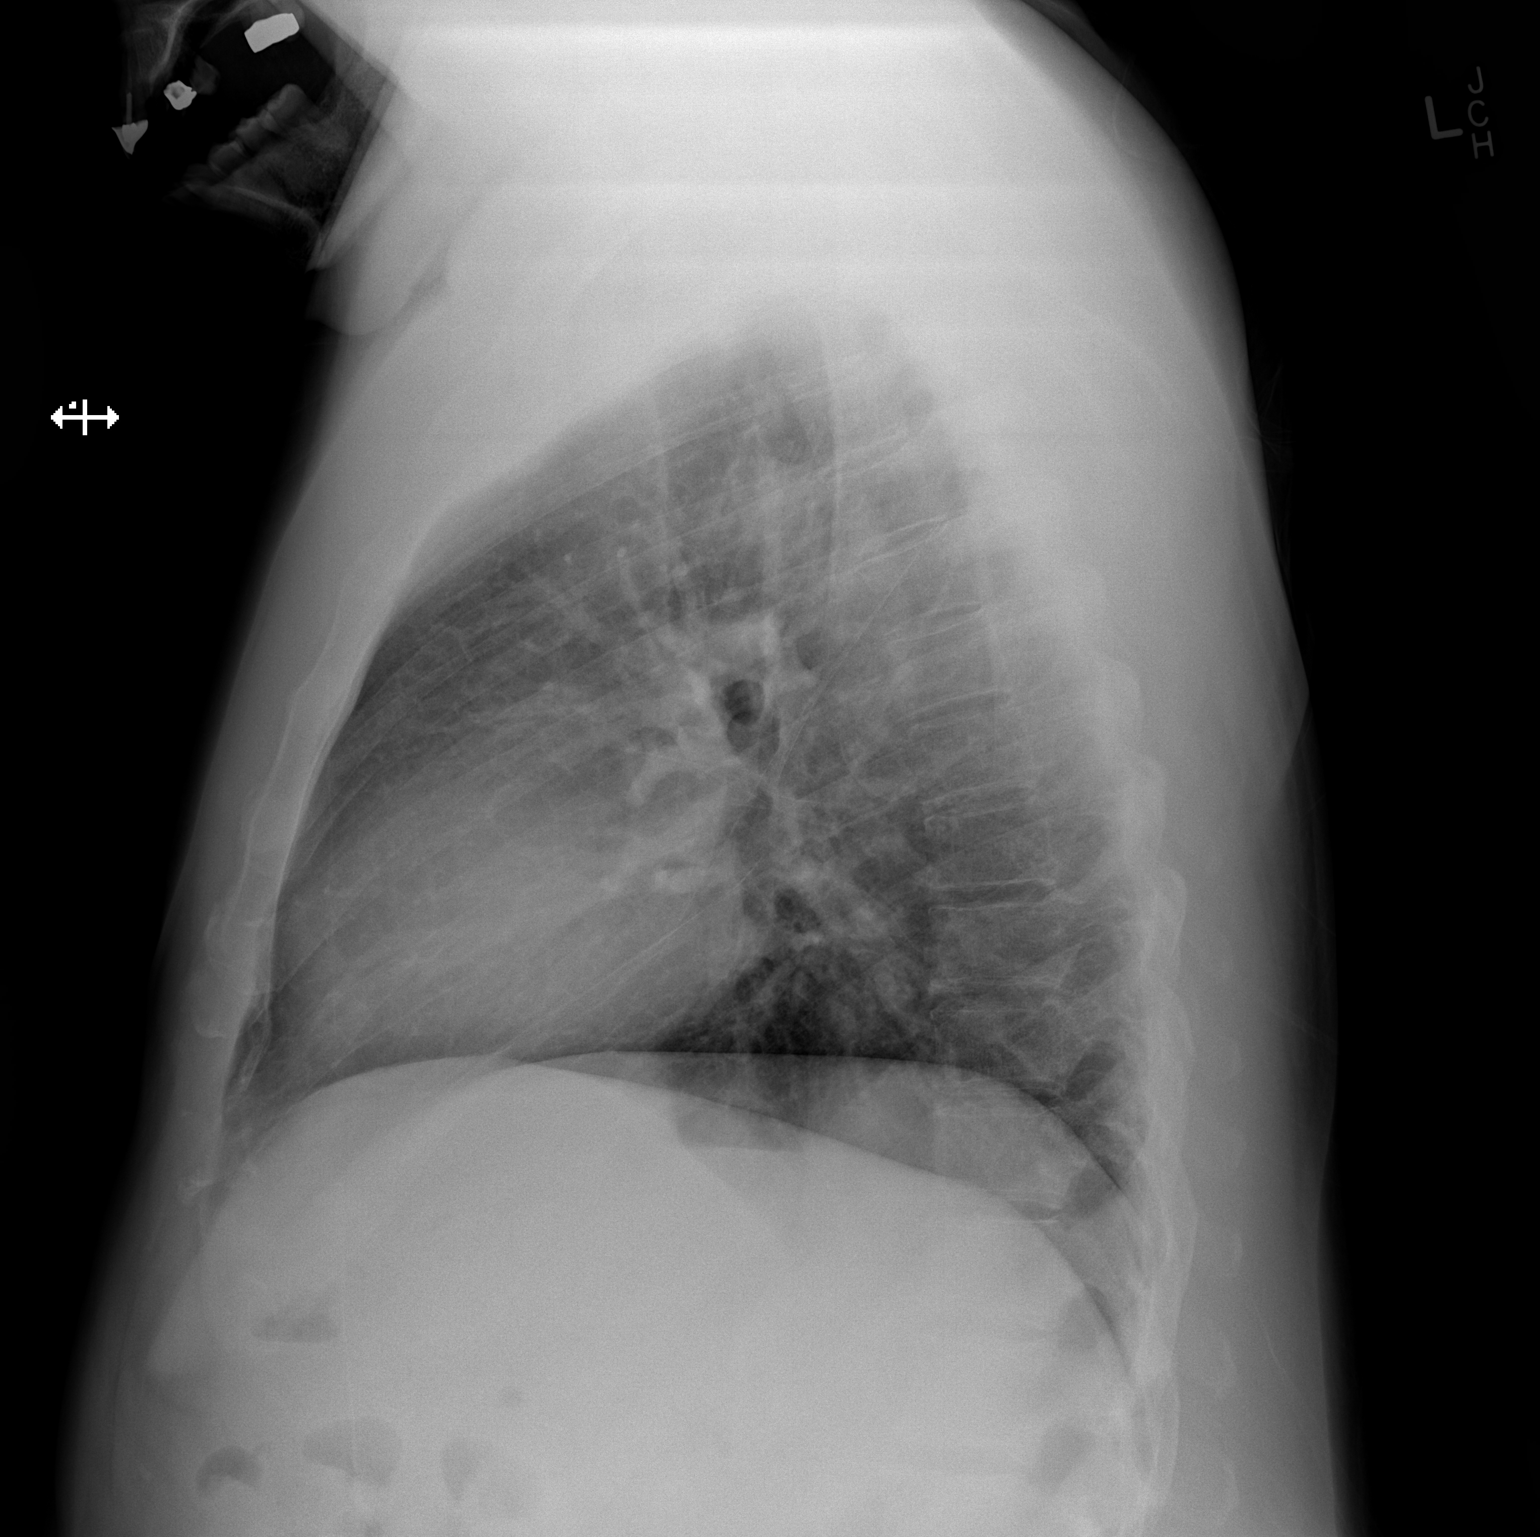

[2 of 2 positions shown; findings below may reference images not displayed]

FINDINGS: The lungs are clear. Heart size and pulmonary vascularity are
normal. No adenopathy. No pneumothorax. No bone lesions.
IMPRESSION: No abnormality noted.

## 2014-06-04 IMAGING — CT CT CHEST W/ CM
1 of 2 series · 14 of 31 positions shown, 18 images · IV contrast (OMNIPAQUE 300)
Comparison: Chest x-ray of 06/07/2013

CLINICAL DATA: Left lateral chest wall pain and swelling, no injury

EXAM:
CT CHEST WITH CONTRAST
TECHNIQUE: Multidetector CT imaging of the chest was performed during
intravenous contrast administration.
CONTRAST:  80mL OMNIPAQUE IOHEXOL 300 MG/ML  SOLN

[Series 2: chest with st · axial · 0.98mm/px · z∈[+1462,+1662]mm · 14 of 48 slices shown, 18 images]
[im 4/48  mediastinal]
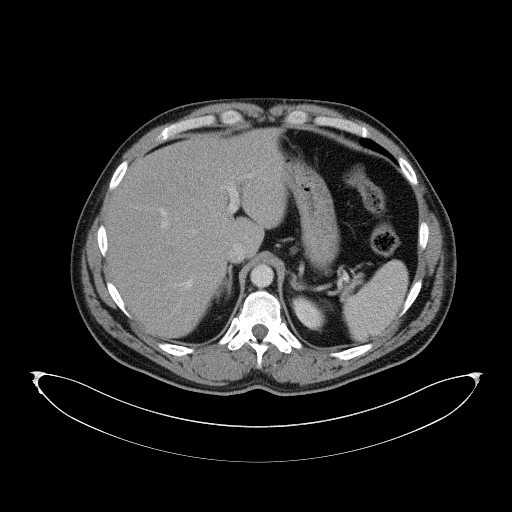
[im 4/48  lung]
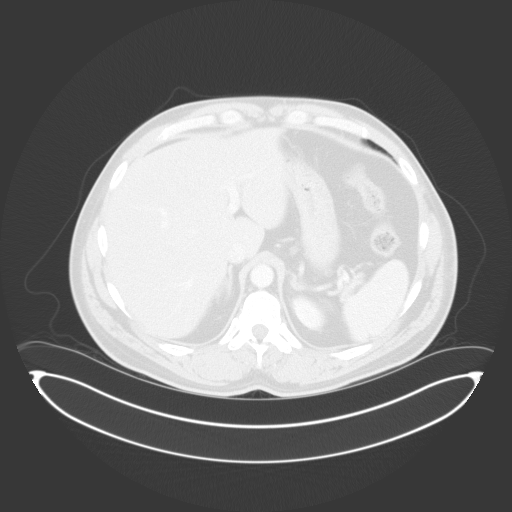
[im 8/48  lung]
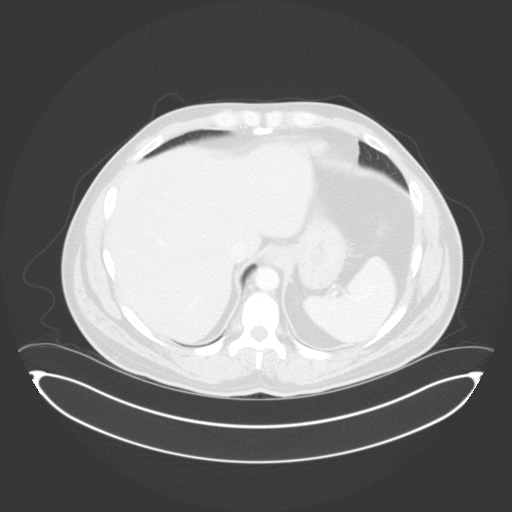
[im 11/48  lung]
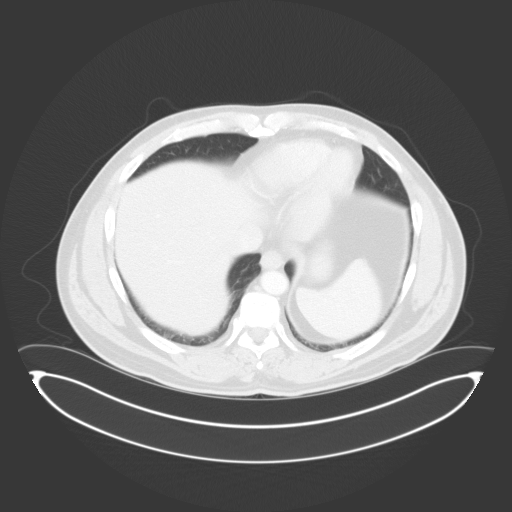
[im 15/48  lung]
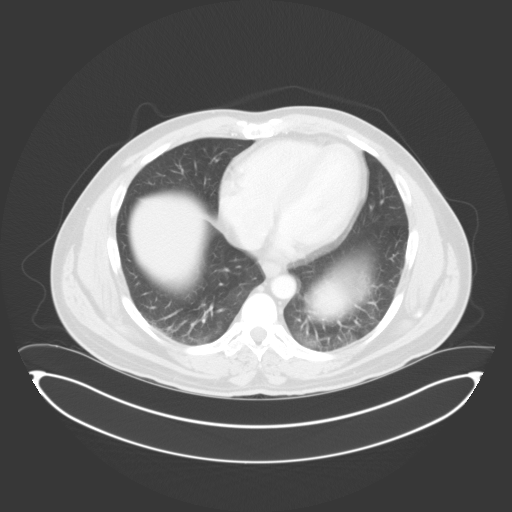
[im 19/48  mediastinal]
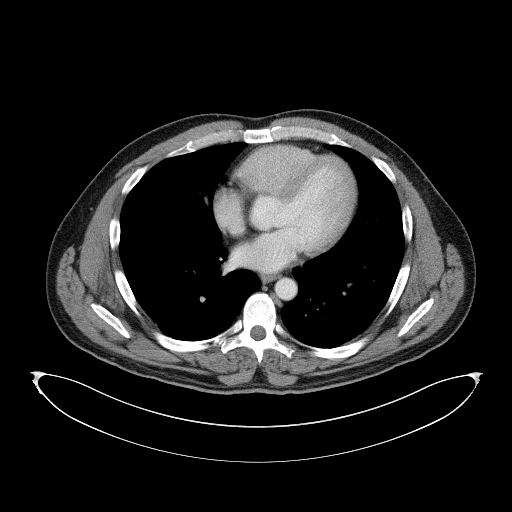
[im 19/48  lung]
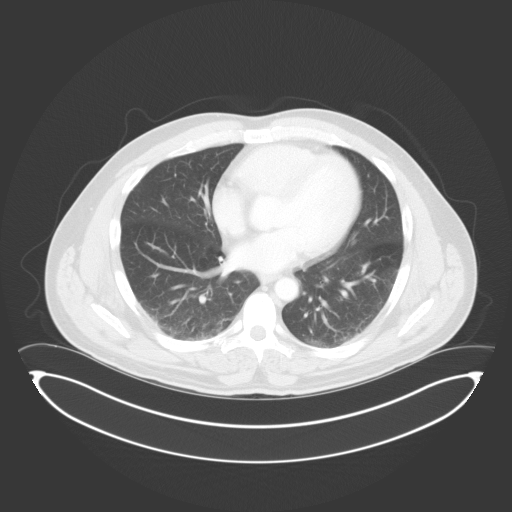
[im 22/48  lung]
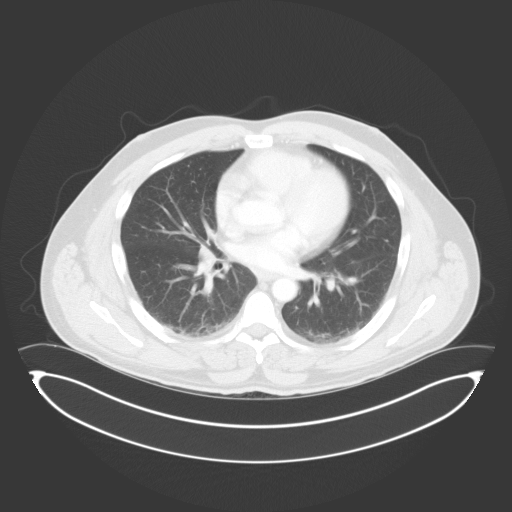
[im 23/48  lung]
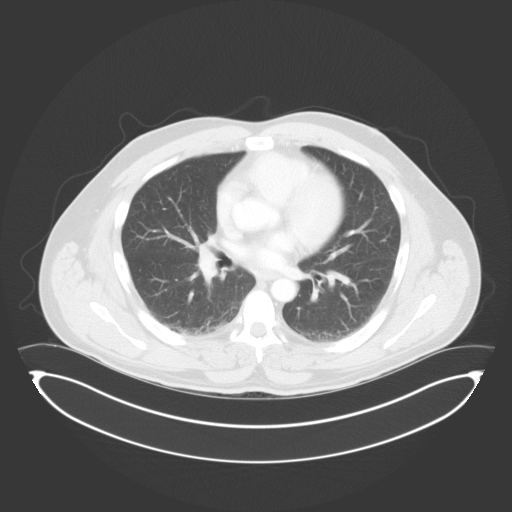
[im 24/48  lung]
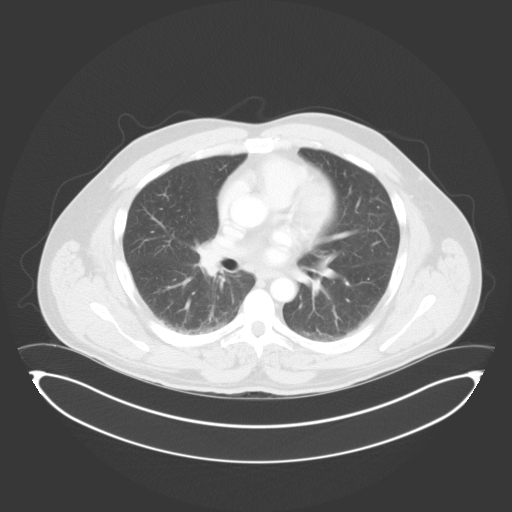
[im 26/48  mediastinal]
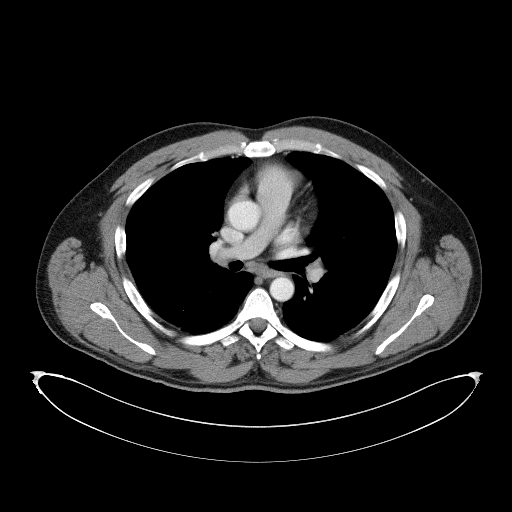
[im 26/48  lung]
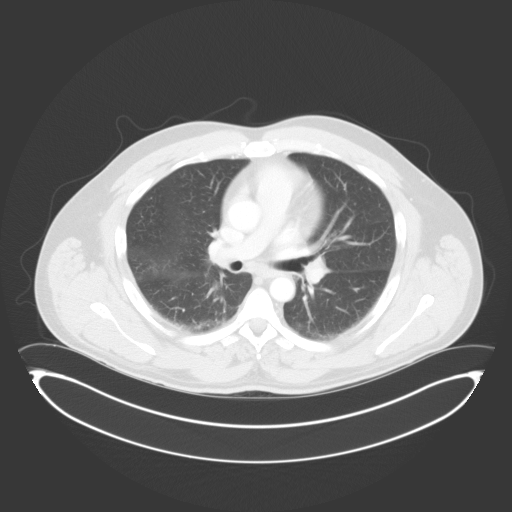
[im 29/48  lung]
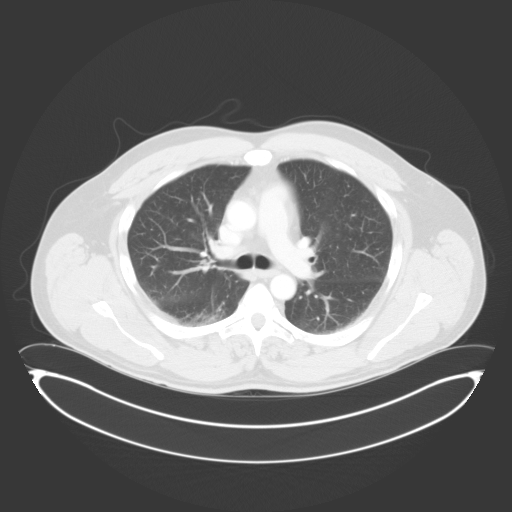
[im 33/48  lung]
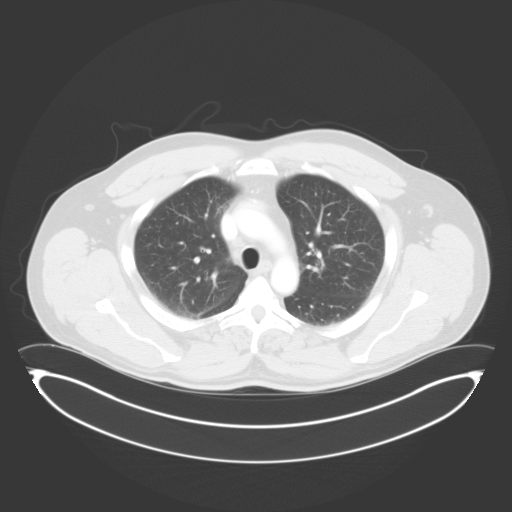
[im 37/48  lung]
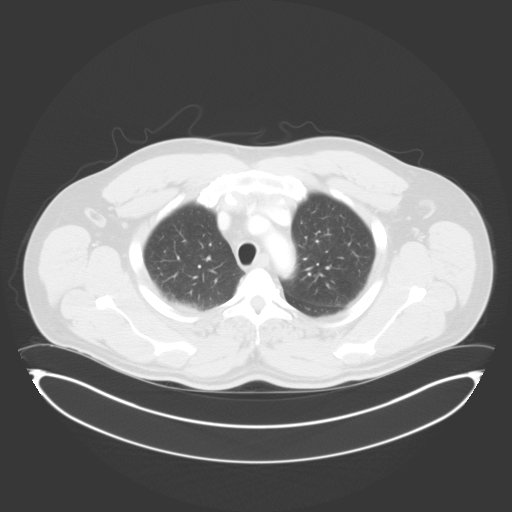
[im 40/48  mediastinal]
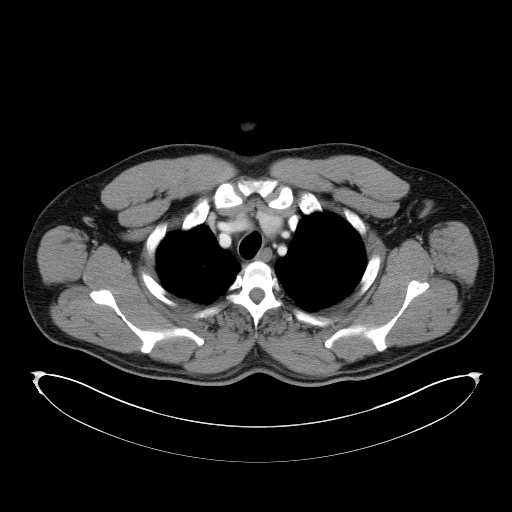
[im 40/48  lung]
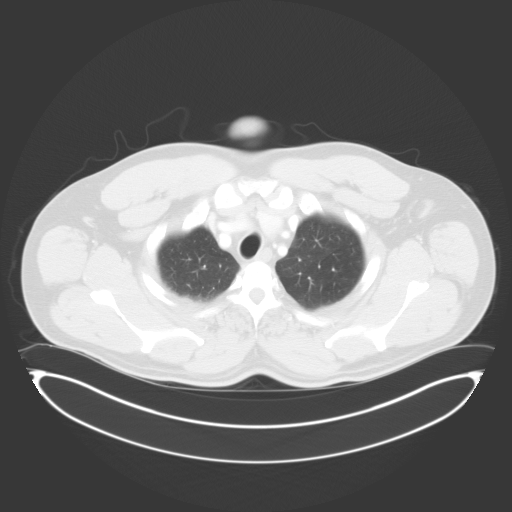
[im 44/48  lung]
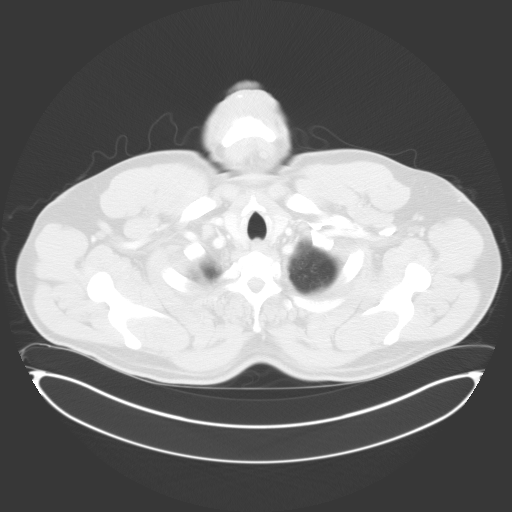

[14 of 31 positions shown; findings below may reference images not displayed]

FINDINGS: There is bibasilar dependent atelectasis. However no pneumonia or
effusion is seen. Minimal stranding this is present in the superior
segment of the right lower lobe developing pneumonia would be
difficult to exclude.

On soft tissue window images, the thyroid gland is within normal
limits in size. The thoracic aorta opacifies with no acute
abnormality and the origins of the great vessels are patent. Only
small mediastinal lymph nodes are present. The pulmonary arteries
are not as well opacified but no significant abnormality is seen.
The portion of the upper abdomen that is visualized is unremarkable.
The spleen appears intact. No rib fracture is seen. The thoracic
vertebrae are in normal alignment.
IMPRESSION: 1. No explanation for the patient's left lateral chest wall pain is
seen.
2. Bibasilar dependent atelectasis. Probable atelectasis in the
superior segment of the right lower lobe. Consider followup if
symptoms persist or worsen.

## 2014-09-19 DIAGNOSIS — E119 Type 2 diabetes mellitus without complications: Secondary | ICD-10-CM | POA: Insufficient documentation

## 2014-09-19 DIAGNOSIS — J069 Acute upper respiratory infection, unspecified: Secondary | ICD-10-CM | POA: Insufficient documentation

## 2014-09-21 ENCOUNTER — Encounter: Payer: Self-pay | Admitting: Internal Medicine

## 2014-11-28 ENCOUNTER — Encounter: Payer: Self-pay | Admitting: Internal Medicine

## 2015-04-09 ENCOUNTER — Other Ambulatory Visit: Payer: Self-pay | Admitting: Orthopedic Surgery

## 2015-04-19 ENCOUNTER — Encounter (HOSPITAL_BASED_OUTPATIENT_CLINIC_OR_DEPARTMENT_OTHER): Payer: Self-pay | Admitting: *Deleted

## 2015-04-19 ENCOUNTER — Encounter (HOSPITAL_BASED_OUTPATIENT_CLINIC_OR_DEPARTMENT_OTHER)
Admission: RE | Admit: 2015-04-19 | Discharge: 2015-04-19 | Disposition: A | Discharge status: Home / Self Care | Payer: Self-pay | Source: Ambulatory Visit | Attending: Orthopedic Surgery | Admitting: Orthopedic Surgery

## 2015-04-19 DIAGNOSIS — I10 Essential (primary) hypertension: Secondary | ICD-10-CM | POA: Diagnosis not present

## 2015-04-19 DIAGNOSIS — M7541 Impingement syndrome of right shoulder: Secondary | ICD-10-CM | POA: Diagnosis present

## 2015-04-19 DIAGNOSIS — Z9889 Other specified postprocedural states: Secondary | ICD-10-CM | POA: Diagnosis not present

## 2015-04-19 DIAGNOSIS — Z7952 Long term (current) use of systemic steroids: Secondary | ICD-10-CM | POA: Diagnosis not present

## 2015-04-19 DIAGNOSIS — M19011 Primary osteoarthritis, right shoulder: Secondary | ICD-10-CM | POA: Diagnosis not present

## 2015-04-19 DIAGNOSIS — Z791 Long term (current) use of non-steroidal anti-inflammatories (NSAID): Secondary | ICD-10-CM | POA: Diagnosis not present

## 2015-04-19 DIAGNOSIS — M75101 Unspecified rotator cuff tear or rupture of right shoulder, not specified as traumatic: Secondary | ICD-10-CM | POA: Diagnosis not present

## 2015-04-19 DIAGNOSIS — Z8249 Family history of ischemic heart disease and other diseases of the circulatory system: Secondary | ICD-10-CM | POA: Diagnosis not present

## 2015-04-19 DIAGNOSIS — K219 Gastro-esophageal reflux disease without esophagitis: Secondary | ICD-10-CM | POA: Diagnosis not present

## 2015-04-19 DIAGNOSIS — E119 Type 2 diabetes mellitus without complications: Secondary | ICD-10-CM | POA: Diagnosis not present

## 2015-04-19 DIAGNOSIS — Z833 Family history of diabetes mellitus: Secondary | ICD-10-CM | POA: Diagnosis not present

## 2015-04-19 DIAGNOSIS — S46211A Strain of muscle, fascia and tendon of other parts of biceps, right arm, initial encounter: Secondary | ICD-10-CM | POA: Diagnosis not present

## 2015-04-19 DIAGNOSIS — Z809 Family history of malignant neoplasm, unspecified: Secondary | ICD-10-CM | POA: Diagnosis not present

## 2015-04-19 DIAGNOSIS — X58XXXA Exposure to other specified factors, initial encounter: Secondary | ICD-10-CM | POA: Diagnosis not present

## 2015-04-19 DIAGNOSIS — Z79891 Long term (current) use of opiate analgesic: Secondary | ICD-10-CM | POA: Diagnosis not present

## 2015-04-19 DIAGNOSIS — Z79899 Other long term (current) drug therapy: Secondary | ICD-10-CM | POA: Diagnosis not present

## 2015-04-19 DIAGNOSIS — F17210 Nicotine dependence, cigarettes, uncomplicated: Secondary | ICD-10-CM | POA: Diagnosis not present

## 2015-04-19 LAB — POCT I-STAT, CHEM 8
BUN: 12 mg/dL (ref 6–20)
Calcium, Ion: 1.2 mmol/L (ref 1.12–1.23)
Chloride: 104 mmol/L (ref 101–111)
Creatinine, Ser: 0.8 mg/dL (ref 0.61–1.24)
GLUCOSE: 84 mg/dL (ref 65–99)
HCT: 45 % (ref 39.0–52.0)
Hemoglobin: 15.3 g/dL (ref 13.0–17.0)
Potassium: 3.7 mmol/L (ref 3.5–5.1)
Sodium: 140 mmol/L (ref 135–145)
TCO2: 22 mmol/L (ref 0–100)

## 2015-04-19 NOTE — OR Nursing (Signed)
Patient seen in call room for history and labs. Patient not consistently taking diabetes or BP medication. I-stat ran with normal results BS 84. EKG showed some irregularities. Reviewed with Anesthesia (Dr. Jillyn Hidden) along with BP's of 138/111 and 159/98. Sat's 100% and Temp at 98.6 orally. Patient denies any SOB with activities or any chest pain or other cardiac symptoms. Patient surgical schedule for 8/26 not changed. Patient given money to buy his Blood Pressure medications today and patient assured me that he would take as per his physicians orders.  Sherron Monday, RN BSN CAP[facility], Public relations account executive [employer].

## 2015-04-20 ENCOUNTER — Ambulatory Visit (HOSPITAL_BASED_OUTPATIENT_CLINIC_OR_DEPARTMENT_OTHER): Payer: Worker's Compensation | Admitting: Anesthesiology

## 2015-04-20 ENCOUNTER — Encounter (HOSPITAL_BASED_OUTPATIENT_CLINIC_OR_DEPARTMENT_OTHER)
Admission: RE | Disposition: A | Discharge status: Home / Self Care | Payer: Self-pay | Source: Ambulatory Visit | Attending: Orthopedic Surgery

## 2015-04-20 ENCOUNTER — Ambulatory Visit (HOSPITAL_BASED_OUTPATIENT_CLINIC_OR_DEPARTMENT_OTHER)
Admission: RE | Admit: 2015-04-20 | Discharge: 2015-04-20 | Disposition: A | Discharge status: Home / Self Care | Payer: Worker's Compensation | Source: Ambulatory Visit | Attending: Orthopedic Surgery | Admitting: Orthopedic Surgery

## 2015-04-20 ENCOUNTER — Encounter (HOSPITAL_BASED_OUTPATIENT_CLINIC_OR_DEPARTMENT_OTHER): Payer: Self-pay

## 2015-04-20 DIAGNOSIS — M75101 Unspecified rotator cuff tear or rupture of right shoulder, not specified as traumatic: Secondary | ICD-10-CM | POA: Insufficient documentation

## 2015-04-20 DIAGNOSIS — I10 Essential (primary) hypertension: Secondary | ICD-10-CM | POA: Insufficient documentation

## 2015-04-20 DIAGNOSIS — X58XXXA Exposure to other specified factors, initial encounter: Secondary | ICD-10-CM | POA: Insufficient documentation

## 2015-04-20 DIAGNOSIS — Z8249 Family history of ischemic heart disease and other diseases of the circulatory system: Secondary | ICD-10-CM | POA: Insufficient documentation

## 2015-04-20 DIAGNOSIS — F17210 Nicotine dependence, cigarettes, uncomplicated: Secondary | ICD-10-CM | POA: Insufficient documentation

## 2015-04-20 DIAGNOSIS — Z9889 Other specified postprocedural states: Secondary | ICD-10-CM | POA: Insufficient documentation

## 2015-04-20 DIAGNOSIS — Z809 Family history of malignant neoplasm, unspecified: Secondary | ICD-10-CM | POA: Insufficient documentation

## 2015-04-20 DIAGNOSIS — M19011 Primary osteoarthritis, right shoulder: Secondary | ICD-10-CM | POA: Insufficient documentation

## 2015-04-20 DIAGNOSIS — K219 Gastro-esophageal reflux disease without esophagitis: Secondary | ICD-10-CM | POA: Insufficient documentation

## 2015-04-20 DIAGNOSIS — Z79899 Other long term (current) drug therapy: Secondary | ICD-10-CM | POA: Insufficient documentation

## 2015-04-20 DIAGNOSIS — S46211A Strain of muscle, fascia and tendon of other parts of biceps, right arm, initial encounter: Secondary | ICD-10-CM | POA: Diagnosis not present

## 2015-04-20 DIAGNOSIS — Z7952 Long term (current) use of systemic steroids: Secondary | ICD-10-CM | POA: Insufficient documentation

## 2015-04-20 DIAGNOSIS — Z79891 Long term (current) use of opiate analgesic: Secondary | ICD-10-CM | POA: Insufficient documentation

## 2015-04-20 DIAGNOSIS — Z833 Family history of diabetes mellitus: Secondary | ICD-10-CM | POA: Insufficient documentation

## 2015-04-20 DIAGNOSIS — E119 Type 2 diabetes mellitus without complications: Secondary | ICD-10-CM | POA: Insufficient documentation

## 2015-04-20 DIAGNOSIS — Z791 Long term (current) use of non-steroidal anti-inflammatories (NSAID): Secondary | ICD-10-CM | POA: Insufficient documentation

## 2015-04-20 HISTORY — DX: Type 2 diabetes mellitus without complications: E11.9

## 2015-04-20 LAB — GLUCOSE, CAPILLARY
Glucose-Capillary: 82 mg/dL (ref 65–99)
Glucose-Capillary: 102 mg/dL — ABNORMAL HIGH (ref 65–99)

## 2015-04-20 LAB — POCT HEMOGLOBIN-HEMACUE: Hemoglobin: 15.3 g/dL (ref 13.0–17.0)

## 2015-04-20 SURGERY — SHOULDER ARTHROSCOPY WITH SUBACROMIAL DECOMPRESSION AND DISTAL CLAVICLE EXCISION
Anesthesia: Regional | Site: Shoulder | Laterality: Right

## 2015-04-20 MED ORDER — PHENYLEPHRINE HCL 10 MG/ML IJ SOLN
INTRAMUSCULAR | Status: DC | PRN
  Administered 2015-04-20: 40 ug via INTRAVENOUS

## 2015-04-20 MED ORDER — GLYCOPYRROLATE 0.2 MG/ML IJ SOLN: 0.2000 mg | Freq: Once | INTRAMUSCULAR | Status: DC | PRN

## 2015-04-20 MED ORDER — FENTANYL CITRATE (PF) 100 MCG/2ML IJ SOLN
INTRAMUSCULAR | Status: AC
  Filled 2015-04-20: qty 2

## 2015-04-20 MED ORDER — DEXAMETHASONE SODIUM PHOSPHATE 4 MG/ML IJ SOLN
INTRAMUSCULAR | Status: DC | PRN
  Administered 2015-04-20: 8 mg via INTRAVENOUS

## 2015-04-20 MED ORDER — PROPOFOL 500 MG/50ML IV EMUL
INTRAVENOUS | Status: AC
Start: 2015-04-20 — End: 2015-04-20
  Filled 2015-04-20: qty 50

## 2015-04-20 MED ORDER — ONDANSETRON HCL 4 MG/2ML IJ SOLN
INTRAMUSCULAR | Status: DC | PRN
  Administered 2015-04-20: 4 mg via INTRAVENOUS

## 2015-04-20 MED ORDER — MEPERIDINE HCL 25 MG/ML IJ SOLN: 6.2500 mg | INTRAMUSCULAR | Status: DC | PRN

## 2015-04-20 MED ORDER — FENTANYL CITRATE (PF) 100 MCG/2ML IJ SOLN
50.0000 ug | INTRAMUSCULAR | Status: DC | PRN
  Administered 2015-04-20 (×2): 100 ug via INTRAVENOUS

## 2015-04-20 MED ORDER — SCOPOLAMINE 1 MG/3DAYS TD PT72: 1.0000 | MEDICATED_PATCH | Freq: Once | TRANSDERMAL | Status: DC | PRN

## 2015-04-20 MED ORDER — OXYCODONE HCL 5 MG/5ML PO SOLN: 5.0000 mg | Freq: Once | ORAL | Status: DC | PRN

## 2015-04-20 MED ORDER — CEFAZOLIN SODIUM-DEXTROSE 2-3 GM-% IV SOLR
INTRAVENOUS | Status: AC
  Filled 2015-04-20: qty 50

## 2015-04-20 MED ORDER — HYDROMORPHONE HCL 1 MG/ML IJ SOLN: 0.2500 mg | INTRAMUSCULAR | Status: DC | PRN

## 2015-04-20 MED ORDER — LIDOCAINE HCL (CARDIAC) 20 MG/ML IV SOLN
INTRAVENOUS | Status: DC | PRN
  Administered 2015-04-20: 50 mg via INTRAVENOUS

## 2015-04-20 MED ORDER — MIDAZOLAM HCL 2 MG/2ML IJ SOLN
INTRAMUSCULAR | Status: AC
  Filled 2015-04-20: qty 2

## 2015-04-20 MED ORDER — MIDAZOLAM HCL 2 MG/2ML IJ SOLN
INTRAMUSCULAR | Status: AC
Start: 2015-04-20 — End: 2015-04-20
  Filled 2015-04-20: qty 2

## 2015-04-20 MED ORDER — LACTATED RINGERS IV SOLN
INTRAVENOUS | Status: DC
  Administered 2015-04-20 (×2): via INTRAVENOUS

## 2015-04-20 MED ORDER — SUCCINYLCHOLINE CHLORIDE 20 MG/ML IJ SOLN
INTRAMUSCULAR | Status: DC | PRN
  Administered 2015-04-20: 120 mg via INTRAVENOUS

## 2015-04-20 MED ORDER — FENTANYL CITRATE (PF) 100 MCG/2ML IJ SOLN
INTRAMUSCULAR | Status: AC
  Filled 2015-04-20: qty 6

## 2015-04-20 MED ORDER — MIDAZOLAM HCL 2 MG/2ML IJ SOLN
1.0000 mg | INTRAMUSCULAR | Status: DC | PRN
  Administered 2015-04-20 (×2): 2 mg via INTRAVENOUS

## 2015-04-20 MED ORDER — CHLORHEXIDINE GLUCONATE 4 % EX LIQD: 60.0000 mL | Freq: Once | CUTANEOUS | Status: DC

## 2015-04-20 MED ORDER — BUPIVACAINE HCL (PF) 0.5 % IJ SOLN
INTRAMUSCULAR | Status: AC
  Filled 2015-04-20: qty 30

## 2015-04-20 MED ORDER — EPINEPHRINE HCL 1 MG/ML IJ SOLN
INTRAMUSCULAR | Status: DC | PRN
  Administered 2015-04-20: 1 mg

## 2015-04-20 MED ORDER — CEFAZOLIN SODIUM-DEXTROSE 2-3 GM-% IV SOLR
2.0000 g | INTRAVENOUS | Status: AC
  Administered 2015-04-20: 2 g via INTRAVENOUS

## 2015-04-20 MED ORDER — OXYCODONE-ACETAMINOPHEN 5-325 MG PO TABS: 1.0000 | ORAL_TABLET | Freq: Four times a day (QID) | ORAL | Status: DC | PRN

## 2015-04-20 MED ORDER — OXYCODONE HCL 5 MG PO TABS: 5.0000 mg | ORAL_TABLET | Freq: Once | ORAL | Status: DC | PRN

## 2015-04-20 MED ORDER — SODIUM CHLORIDE 0.9 % IR SOLN
Status: DC | PRN
  Administered 2015-04-20: 3000 mL

## 2015-04-20 MED ORDER — EPINEPHRINE HCL 1 MG/ML IJ SOLN
INTRAMUSCULAR | Status: AC
  Filled 2015-04-20: qty 1

## 2015-04-20 MED ORDER — PROPOFOL 10 MG/ML IV BOLUS
INTRAVENOUS | Status: DC | PRN
  Administered 2015-04-20: 200 mg via INTRAVENOUS

## 2015-04-20 SURGICAL SUPPLY — 79 items
APL SKNCLS STERI-STRIP NONHPOA (GAUZE/BANDAGES/DRESSINGS)
BENZOIN TINCTURE PRP APPL 2/3 (GAUZE/BANDAGES/DRESSINGS) IMPLANT
BLADE 4.2CUDA (BLADE) IMPLANT
BLADE SURG 15 STRL LF DISP TIS (BLADE) IMPLANT
BLADE SURG 15 STRL SS (BLADE)
BLADE VORTEX 6.0 (BLADE) ×3 IMPLANT
CANNULA 5.75X71 LONG (CANNULA) IMPLANT
CANNULA TWIST IN 8.25X7CM (CANNULA) IMPLANT
CUTTER MENISCUS  4.2MM (BLADE) ×1
CUTTER MENISCUS 4.2MM (BLADE) ×2 IMPLANT
DECANTER SPIKE VIAL GLASS SM (MISCELLANEOUS) IMPLANT
DRAPE INCISE IOBAN 66X45 STRL (DRAPES) ×3 IMPLANT
DRAPE STERI 35X30 U-POUCH (DRAPES) ×3 IMPLANT
DRAPE SURG 17X23 STRL (DRAPES) ×3 IMPLANT
DRAPE U-SHAPE 47X51 STRL (DRAPES) ×3 IMPLANT
DRAPE U-SHAPE 76X120 STRL (DRAPES) ×6 IMPLANT
DRSG EMULSION OIL 3X3 NADH (GAUZE/BANDAGES/DRESSINGS) ×3 IMPLANT
DRSG PAD ABDOMINAL 8X10 ST (GAUZE/BANDAGES/DRESSINGS) ×6 IMPLANT
DURAPREP 26ML APPLICATOR (WOUND CARE) ×3 IMPLANT
ELECT REM PT RETURN 9FT ADLT (ELECTROSURGICAL) ×3
ELECTRODE REM PT RTRN 9FT ADLT (ELECTROSURGICAL) ×2 IMPLANT
GAUZE SPONGE 4X4 12PLY STRL (GAUZE/BANDAGES/DRESSINGS) ×3 IMPLANT
GLOVE BIOGEL PI IND STRL 7.0 (GLOVE) ×1 IMPLANT
GLOVE BIOGEL PI IND STRL 8 (GLOVE) ×4 IMPLANT
GLOVE BIOGEL PI INDICATOR 7.0 (GLOVE) ×1
GLOVE BIOGEL PI INDICATOR 8 (GLOVE) ×2
GLOVE ECLIPSE 6.5 STRL STRAW (GLOVE) ×2 IMPLANT
GLOVE ECLIPSE 7.5 STRL STRAW (GLOVE) ×6 IMPLANT
GLOVE EXAM NITRILE EXT CUFF MD (GLOVE) ×2 IMPLANT
GOWN STRL REUS W/ TWL LRG LVL3 (GOWN DISPOSABLE) ×2 IMPLANT
GOWN STRL REUS W/ TWL XL LVL3 (GOWN DISPOSABLE) ×2 IMPLANT
GOWN STRL REUS W/TWL LRG LVL3 (GOWN DISPOSABLE) ×3
GOWN STRL REUS W/TWL XL LVL3 (GOWN DISPOSABLE) ×6 IMPLANT
IV NS IRRIG 3000ML ARTHROMATIC (IV SOLUTION) ×6 IMPLANT
MANIFOLD NEPTUNE II (INSTRUMENTS) ×3 IMPLANT
NDL 1/2 CIR CATGUT .05X1.09 (NEEDLE) IMPLANT
NDL HYPO 18GX1.5 BLUNT FILL (NEEDLE) ×1 IMPLANT
NDL SCORPION MULTI FIRE (NEEDLE) IMPLANT
NDL SUT 6 .5 CRC .975X.05 MAYO (NEEDLE) IMPLANT
NEEDLE 1/2 CIR CATGUT .05X1.09 (NEEDLE) IMPLANT
NEEDLE HYPO 18GX1.5 BLUNT FILL (NEEDLE) ×3 IMPLANT
NEEDLE MAYO TAPER (NEEDLE)
NEEDLE SCORPION MULTI FIRE (NEEDLE) IMPLANT
NS IRRIG 1000ML POUR BTL (IV SOLUTION) IMPLANT
PACK ARTHROSCOPY DSU (CUSTOM PROCEDURE TRAY) ×3 IMPLANT
PACK BASIN DAY SURGERY FS (CUSTOM PROCEDURE TRAY) ×3 IMPLANT
PASSER SUT SWANSON 36MM LOOP (INSTRUMENTS) IMPLANT
PENCIL BUTTON HOLSTER BLD 10FT (ELECTRODE) IMPLANT
SET IRRIG Y TYPE TUR BLADDER L (SET/KITS/TRAYS/PACK) ×3 IMPLANT
SLEEVE SCD COMPRESS KNEE MED (MISCELLANEOUS) ×3 IMPLANT
SLING ARM IMMOBILIZER LRG (SOFTGOODS) IMPLANT
SLING ARM LRG ADULT FOAM STRAP (SOFTGOODS) IMPLANT
SLING ARM MED ADULT FOAM STRAP (SOFTGOODS) IMPLANT
SLING ARM XL FOAM STRAP (SOFTGOODS) ×2 IMPLANT
SPONGE LAP 4X18 X RAY DECT (DISPOSABLE) IMPLANT
STRIP CLOSURE SKIN 1/2X4 (GAUZE/BANDAGES/DRESSINGS) IMPLANT
SUCTION FRAZIER TIP 10 FR DISP (SUCTIONS) IMPLANT
SUT ETHIBOND 2 OS 4 DA (SUTURE) IMPLANT
SUT ETHILON 4 0 PS 2 18 (SUTURE) IMPLANT
SUT FIBERWIRE #2 38 T-5 BLUE (SUTURE)
SUT MNCRL AB 3-0 PS2 18 (SUTURE) IMPLANT
SUT PDS AB 0 CT 36 (SUTURE) ×2 IMPLANT
SUT TICRON 1 T 12 (SUTURE) IMPLANT
SUT TIGER TAPE 7 IN WHITE (SUTURE) IMPLANT
SUT VIC AB 0 CT1 27 (SUTURE)
SUT VIC AB 0 CT1 27XBRD ANBCTR (SUTURE) IMPLANT
SUT VIC AB 1 CT1 27 (SUTURE)
SUT VIC AB 1 CT1 27XBRD ANBCTR (SUTURE) IMPLANT
SUT VIC AB 2-0 SH 27 (SUTURE)
SUT VIC AB 2-0 SH 27XBRD (SUTURE) IMPLANT
SUTURE FIBERWR #2 38 T-5 BLUE (SUTURE) IMPLANT
SYR 5ML LL (SYRINGE) ×3 IMPLANT
TAPE FIBER 2MM 7IN #2 BLUE (SUTURE) IMPLANT
TOWEL OR 17X24 6PK STRL BLUE (TOWEL DISPOSABLE) ×3 IMPLANT
TOWEL OR NON WOVEN STRL DISP B (DISPOSABLE) ×1 IMPLANT
TUBE CONNECTING 20X1/4 (TUBING) IMPLANT
WAND STAR VAC 90 (SURGICAL WAND) ×3 IMPLANT
WATER STERILE IRR 1000ML POUR (IV SOLUTION) ×3 IMPLANT
YANKAUER SUCT BULB TIP NO VENT (SUCTIONS) IMPLANT

## 2015-04-20 NOTE — Discharge Instructions (Signed)
Discharge Instructions after Arthroscopic Shoulder Surgery   A sling has been provided for you. You may remove the sling after 72 hours. The sling may be worn for your protection, if you are in a crowd.  Use ice on the shoulder intermittently over the first 48 hours after surgery.  Pain medication has been prescribed for you.  Use your medication liberally over the first 48 hours, and then begin to taper your use. You may take Extra Strength Tylenol or Tylenol only in place of the pain pills. You may remove your dressing after two days.  You may shower 5 days after surgery. The incision CANNOT get wet prior to 5 days. Simply allow the water to wash over the site and then pat dry. Do not rub the incision. Make sure your axilla (armpit) is completely dry after showering.  Take one aspirin a day for 2 weeks after surgery, unless you have an aspirin sensitivity/allergy or asthma.  Three to 5 times each day you should perform assisted overhead reaching and external rotation (outward turning) exercises with the operative arm. Both exercises should be done with the non-operative arm used as the "therapist arm" while the operative arm remains relaxed. Ten of each exercise should be done three to five times each day.    Overhead reach is helping to lift your stiff arm up as high as it will go. To stretch your overhead reach, lie flat on your back, relax, and grasp the wrist of the tight shoulder with your opposite hand. Using the power in your opposite arm, bring the stiff arm up as far as it is comfortable. Start holding it for ten seconds and then work up to where you can hold it for a count of 30. Breathe slowly and deeply while the arm is moved. Repeat this stretch ten times, trying to help the arm up a little higher each time.       External rotation is turning the arm out to the side while your elbow stays close to your body. External rotation is best stretched while you are lying on your back.  Hold a cane, yardstick, broom handle, or dowel in both hands. Bend both elbows to a right angle. Use steady, gentle force from your normal arm to rotate the hand of the stiff shoulder out away from your body. Continue the rotation as far as it will go comfortably, holding it there for a count of 10. Repeat this exercise ten times.     Please call [phone] during normal business hours or after hours for any problems. Including the following:  - excessive redness of the incisions - drainage for more than 4 days - fever of more than 101.5 F  *Please note that pain medications will not be refilled after hours or on weekends.   Regional Anesthesia Blocks  1. Numbness or the inability to move the "blocked" extremity may last from 3-48 hours after placement. The length of time depends on the medication injected and your individual response to the medication. If the numbness is not going away after 48 hours, call your surgeon.  2. The extremity that is blocked will need to be protected until the numbness is gone and the  Strength has returned. Because you cannot feel it, you will need to take extra care to avoid injury. Because it may be weak, you may have difficulty moving it or using it. You may not know what position it is in without looking at it while the block  is in effect.  3. For blocks in the legs and feet, returning to weight bearing and walking needs to be done carefully. You will need to wait until the numbness is entirely gone and the strength has returned. You should be able to move your leg and foot normally before you try and bear weight or walk. You will need someone to be with you when you first try to ensure you do not fall and possibly risk injury.  4. Bruising and tenderness at the needle site are common side effects and will resolve in a few days.  5. Persistent numbness or new problems with movement should be communicated to the surgeon or the [facility]  [phone] [place] [phone]).       Post Anesthesia Home Care Instructions  Activity: Get plenty of rest for the remainder of the day. A responsible adult should stay with you for 24 hours following the procedure.  For the next 24 hours, DO NOT: -Drive a car -Paediatric nurse -Drink alcoholic beverages -Take any medication unless instructed by your physician -Make any legal decisions or sign important papers.  Meals: Start with liquid foods such as gelatin or soup. Progress to regular foods as tolerated. Avoid greasy, spicy, heavy foods. If nausea and/or vomiting occur, drink only clear liquids until the nausea and/or vomiting subsides. Call your physician if vomiting continues.  Special Instructions/Symptoms: Your throat may feel dry or sore from the anesthesia or the breathing tube placed in your throat during surgery. If this causes discomfort, gargle with warm salt water. The discomfort should disappear within 24 hours.  If you had a scopolamine patch placed behind your ear for the management of post- operative nausea and/or vomiting:  1. The medication in the patch is effective for 72 hours, after which it should be removed.  Wrap patch in a tissue and discard in the trash. Wash hands thoroughly with soap and water. 2. You may remove the patch earlier than 72 hours if you experience unpleasant side effects which may include dry mouth, dizziness or visual disturbances. 3. Avoid touching the patch. Wash your hands with soap and water after contact with the patch.

## 2015-04-20 NOTE — Anesthesia Procedure Notes (Addendum)
Procedure Name: Intubation Date/Time: 04/20/2015 12:55 PM Performed by: Melynda Ripple D Pre-anesthesia Checklist: Patient identified, Emergency Drugs available, Suction available and Patient being monitored Patient Re-evaluated:Patient Re-evaluated prior to inductionOxygen Delivery Method: Circle System Utilized Preoxygenation: Pre-oxygenation with 100% oxygen Intubation Type: IV induction Ventilation: Mask ventilation without difficulty Laryngoscope Size: Mac and 3 Grade View: Grade I Tube type: Oral Tube size: 7.0 mm Number of attempts: 1 Airway Equipment and Method: Stylet and Oral airway Placement Confirmation: ETT inserted through vocal cords under direct vision,  positive ETCO2 and breath sounds checked- equal and bilateral Secured at: 23 cm Tube secured with: Tape Dental Injury: Teeth and Oropharynx as per pre-operative assessment    Anesthesia Regional Block:  Interscalene brachial plexus block  Pre-Anesthetic Checklist: ,, timeout performed, Correct Patient, Correct Site, Correct Laterality, Correct Procedure, Correct Position, site marked, Risks and benefits discussed,  Surgical consent,  Pre-op evaluation,  At surgeon's request and post-op pain management  Laterality: Right and Upper  Prep: chloraprep       Needles:  Injection technique: Single-shot  Needle Type: Echogenic Needle     Needle Length: 5cm 5 cm Needle Gauge: 21 and 21 G    Additional Needles:  Procedures: ultrasound guided (picture in chart) Interscalene brachial plexus block Narrative:  Start time: 05/04/2015 12:20 PM End time: 05/04/2015 12:28 PM Injection made incrementally with aspirations every 5 mL.  Performed by: Personally  Anesthesiologist: Brynna Dobos  Additional Notes: Was unable to save [place] image

## 2015-04-20 NOTE — Brief Op Note (Signed)
04/20/2015  1:51 PM  PATIENT:  Dan [name]  49 y.o. male  PRE-OPERATIVE DIAGNOSIS:  BICEP TENDON RUPTURE POSS ROTAOTR CUFF TEAR AC JOINT DJD RIGHT   POST-OPERATIVE DIAGNOSIS:  * No post-op diagnosis entered *  PROCEDURE:  Procedure(s): RIGHT SHOULDER ARTHROSCOPY WITH ROTATOR CUFF REPAIR (Right)  SURGEON:  Surgeon(s) and Role:    * Dorna Leitz, MD - Primary  PHYSICIAN ASSISTANT:   ASSISTANTS: bethune   ANESTHESIA:   general  EBL:  Total I/O In: 200 [I.V.:200] Out: -   BLOOD ADMINISTERED:none  DRAINS: none   LOCAL MEDICATIONS USED:  MARCAINE     SPECIMEN:  No Specimen  DISPOSITION OF SPECIMEN:  N/A  COUNTS:  YES  TOURNIQUET:  * No tourniquets in log *  DICTATION: .Other Dictation: Dictation Number [id number]  PLAN OF CARE: Discharge to home after PACU  PATIENT DISPOSITION:  PACU - hemodynamically stable.   Delay start of Pharmacological VTE agent (>24hrs) due to surgical blood loss or risk of bleeding: no

## 2015-04-20 NOTE — Transfer of Care (Signed)
Immediate Anesthesia Transfer of Care Note  Patient: Dan [name]  Procedure(s) Performed: Procedure(s): RIGHT SHOULDER ARTHROSCOPY WITH ROTATOR CUFF REPAIR (Right)  Patient Location: PACU  Anesthesia Type:General and Regional  Level of Consciousness: awake and alert   Airway & Oxygen Therapy: Patient Spontanous Breathing and Patient connected to face mask oxygen  Post-op Assessment: Report given to RN and Post -op Vital signs reviewed and stable  Post vital signs: Reviewed and stable  Last Vitals:  Filed Vitals:   04/20/15 1230  BP: 124/79  Pulse: 72  Temp:   Resp: 14    Complications: No apparent anesthesia complications

## 2015-04-20 NOTE — Progress Notes (Signed)
Assisted Dr. Crews with right, ultrasound guided, interscalene  block. Side rails up, monitors on throughout procedure. See vital signs in flow sheet. Tolerated Procedure well. 

## 2015-04-20 NOTE — H&P (Signed)
PREOPERATIVE H&P  Chief Complaint: r shoulder pain  HPI: Dan [name] is a 49 y.o. male who presents for evaluation of r shoulder pain. It has been present for several months and has been worsening. He suffered a proximal bicepts rupture and continued to have pain and there was concern for stump impinging and possible RCT.  He has failed conservative measures. Pain is rated as moderate.  Past Medical History  Diagnosis Date  . Hypertension   . Acid reflux   . Diabetes mellitus without complication    Past Surgical History  Procedure Laterality Date  . Pelvis fractur    . Fracture surgery Left at age 26    pelvic fracture   Social History   Social History  . Marital Status: Married    Spouse Name: N/A  . Number of Children: N/A  . Years of Education: N/A   Social History Main Topics  . Smoking status: Current Every Day Smoker -- 1.00 packs/day for 25 years    Types: Cigarettes  . Smokeless tobacco: Never Used  . Alcohol Use: 3.6 oz/week    6 Cans of beer per week     Comment: weekends only  . Drug Use: No  . Sexual Activity: Not Asked   Other Topics Concern  . None   Social History Narrative   Family History  Problem Relation Age of Onset  . Cancer Sister   . Diabetes Brother   . Hypertension Brother    No Known Allergies Prior to Admission medications   Medication Sig Start Date End Date Taking? Authorizing Provider  amLODipine (NORVASC) 10 MG tablet Take 1 tablet (10 mg total) by mouth daily. 05/17/14  Yes Lacretia Leigh, MD  HYDROcodone-acetaminophen (NORCO/VICODIN) 5-325 MG per tablet Take 1 tablet by mouth every 6 (six) hours as needed for moderate pain.   Yes Historical Provider, MD  ibuprofen (ADVIL,MOTRIN) 200 MG tablet Take 800 mg by mouth daily as needed for headache (headache).    Yes Historical Provider, MD  meloxicam (MOBIC) 7.5 MG tablet Take 7.5 mg by mouth daily.   Yes Historical Provider, MD  metFORMIN (GLUCOPHAGE) 500 MG tablet Take 1,000 mg by  mouth 2 (two) times daily with a meal.   Yes Historical Provider, MD  omeprazole (PRILOSEC) 20 MG capsule Take 20 mg by mouth daily.   Yes Historical Provider, MD  oxyCODONE-acetaminophen (PERCOCET/ROXICET) 5-325 MG per tablet Take 1 tablet by mouth every 4 (four) hours as needed for moderate pain or severe pain (headache).   Yes Historical Provider, MD  ranitidine (ZANTAC) 150 MG tablet Take 150 mg by mouth daily.   Yes Historical Provider, MD  acetaminophen (TYLENOL) 325 MG tablet Take 650 mg by mouth every 6 (six) hours as needed (cold symptoms).    Historical Provider, MD  albuterol (PROVENTIL) (2.5 MG/3ML) 0.083% nebulizer solution Take 3 mLs (2.5 mg total) by nebulization every 6 (six) hours as needed for wheezing or shortness of breath. 05/17/14   Lacretia Leigh, MD  azithromycin (ZITHROMAX Z-PAK) 250 MG tablet 2 by mouth daily x1 then 1 by mouth daily x4 days 05/17/14   Lacretia Leigh, MD  predniSONE (DELTASONE) 10 MG tablet Take 2 tablets (20 mg total) by mouth daily. 05/17/14   Lacretia Leigh, MD     Positive ROS: none  All other systems have been reviewed and were otherwise negative with the exception of those mentioned in the HPI and as above.  Physical Exam: Filed Vitals:   04/20/15 1159  BP:   Pulse:   Temp: 98.3 F (36.8 C)  Resp:     General: Alert, no acute distress Cardiovascular: No pedal edema Respiratory: No cyanosis, no use of accessory musculature GI: No organomegaly, abdomen is soft and non-tender Skin: No lesions in the area of chief complaint Neurologic: Sensation intact distally Psychiatric: Patient is competent for consent with normal mood and affect Lymphatic: No axillary or cervical lymphadenopathy  MUSCULOSKELETAL: r shoulder + impingement and good ER strength. +ttp over ac joint  MRI + partial rct and known bicepts tendon tear proximal Assessment/Plan: BICEP TENDON RUPTURE POSS ROTAOTR CUFF TEAR AC JOINT DJD RIGHT  Plan for Procedure(s): RIGHT SHOULDER  ARTHROSCOPY WITH ROTATOR CUFF REPAIR  The risks benefits and alternatives were discussed with the patient including but not limited to the risks of nonoperative treatment, versus surgical intervention including infection, bleeding, nerve injury, malunion, nonunion, hardware prominence, hardware failure, need for hardware removal, blood clots, cardiopulmonary complications, morbidity, mortality, among others, and they were willing to proceed.  He is aware that if the cuff is not torn that we will not attempt repair of bicepts tendon and he is aware that he will have the distal deformity forever.   Predicted outcome is good, although there will be at least a six to nine month expected recovery.  Alta Corning, MD 04/20/2015 12:21 PM

## 2015-04-20 NOTE — Anesthesia Postprocedure Evaluation (Signed)
  Anesthesia Post-op Note  Patient: Dan [name]  Procedure(s) Performed: Procedure(s): RIGHT SHOULDER ARTHROSCOPY WITH ROTATOR CUFF REPAIR (Right)  Patient Location: PACU  Anesthesia Type: General, Regional   Level of Consciousness: awake, alert  and oriented  Airway and Oxygen Therapy: Patient Spontanous Breathing  Post-op Pain: none  Post-op Assessment: Post-op Vital signs reviewed  Post-op Vital Signs: Reviewed  Last Vitals:  Filed Vitals:   04/20/15 1415  BP:   Pulse: 82  Temp:   Resp: 12    Complications: No apparent anesthesia complications

## 2015-04-20 NOTE — Anesthesia Preprocedure Evaluation (Signed)
Anesthesia Evaluation  Patient identified by MRN, date of birth, ID band  Reviewed: Allergy & Precautions, NPO status , Patient's Chart, lab work & pertinent test results  Airway Mallampati: I  TM Distance: >3 FB Neck ROM: Full    Dental  (+) Teeth Intact, Dental Advisory Given   Pulmonary Current Smoker,  breath sounds clear to auscultation        Cardiovascular hypertension, Pt. on medications Rhythm:Regular Rate:Normal     Neuro/Psych    GI/Hepatic GERD-  Medicated and Controlled,  Endo/Other  diabetes, Well Controlled, Type 2, Oral Hypoglycemic Agents  Renal/GU      Musculoskeletal   Abdominal   Peds  Hematology   Anesthesia Other Findings   Reproductive/Obstetrics                             Anesthesia Physical Anesthesia Plan  ASA: II  Anesthesia Plan: General and Regional   Post-op Pain Management:    Induction: Intravenous  Airway Management Planned: Oral ETT  Additional Equipment:   Intra-op Plan:   Post-operative Plan: Extubation in OR  Informed Consent: I have reviewed the patients History and Physical, chart, labs and discussed the procedure including the risks, benefits and alternatives for the proposed anesthesia with the patient or authorized representative who has indicated his/her understanding and acceptance.   Dental advisory given  Plan Discussed with: CRNA, Anesthesiologist and Surgeon  Anesthesia Plan Comments:         Anesthesia Quick Evaluation

## 2015-04-23 NOTE — Op Note (Signed)
Dan [name], Dan [name]                 ACCOUNT NO.:  [id number]  MEDICAL RECORD NO.:  [id number]  LOCATION:                               FACILITY:  [facility]  PHYSICIAN:  Alta Corning, M.D.   DATE OF BIRTH:  1966/08/02  DATE OF PROCEDURE:  04/20/2015 DATE OF DISCHARGE:  04/20/2015                              OPERATIVE REPORT   PREOPERATIVE DIAGNOSES: 1. 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 2. Known biceps tendon tear.  POSTOPERATIVE DIAGNOSES: 1. 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 2. Known biceps tendon tear. 3. With stump of biceps retained in the joint with superior labral     tear anterior to posterior.  PRINCIPAL PROCEDURE: 1. Arthroscopic subacromial decompression, lateral and posterior     compartment. 2. Distal clavicle resection over 20 mm to match the anterior     compartment. 3. Debridement in the glenohumeral joint of partial thickness     undersurface rotator cuff tear and significant anterior labral tear     and biceps tendon stump remnant.  SURGEON:  Alta Corning, M.D.  ASSISTANT:  Gary Fleet, PA  ANESTHESIA:  General.  BRIEF HISTORY:  Dan [name] is a 49 year old male, with a history significant complaints of right shoulder pain.  He was felt to have a proximal biceps tendon rupture and we evaluated him in the office.  MRI was obtained, which shows that he in fact had this.  He had significant complaints of pain with activity.  Certainly, the pain seem to be more, impingement type pain.  After failure of conservative care, we had a long discussion about treatment options and felt that needed arthroscopic evaluation and if he had a rotator cuff tear, we would fix it and potentially look at the distal biceps tendon.  If his rotator cuff is intact, we would leave it alone.  He was brought to the operating room for this procedure.  DESCRIPTION OF PROCEDURE:  The patient was brought to the operating room.  After  adequate anesthesia was obtained with general anesthetic, the patient was placed supine on the operating table.  The right arm was then prepped and draped in usual sterile fashion.  The patient was placed in a beachchair position.  All bony prominences were well padded. Attention was turned to the right shoulder.  Routine arthroscopic examination revealed there was a significant remnant of the stump of the biceps tendon as well as a large anterior labral tear, which was flopped into the joint.  We debrided these things, back up to the superior labrum and this was quite nice at that point.  There was no glenohumeral arthritis, at this point.  Rotator cuff had a fairly significant partial thickness tear on the undersurface.  We debrided this and then took a spinal needle, advanced it through the torn portion of the cuff, and put a PDS suture through one out of the glenohumeral joint into the subacromial space.  At that point, we identified the suture from the top side and the cuff actually felt quite robust from the top side.  We probed it at length and took the suture out and shaved it a bit to take bursa  off and then also to make sure there was no higher grade tear from the top side.  At that point, we felt that the cuff was going to be okay, so we went ahead and did a distal clavicle resection.  We did an arthroscopic acromioplasty from lateral and posterior compartment and then debrided the bursa from within the joint thoroughly.  Once this was done, we again looked at the cuff.  It looked fine.  The acromioplasty had been done nicely and the distal clavicle was resected well at this point.  The shoulder was copiously irrigated and suctioned dry.  The arthroscope portal was closed with bandage.  Sterile compressive dressing was applied.  The patient was taken to the recovery and noted to be in satisfactory condition.  Estimated blood loss for the procedure was minimal.     Alta Corning, M.D.     Corliss Skains  D:  04/20/2015  T:  04/21/2015  Job:  [id number]

## 2015-05-01 ENCOUNTER — Encounter (HOSPITAL_BASED_OUTPATIENT_CLINIC_OR_DEPARTMENT_OTHER): Payer: Self-pay | Admitting: Orthopedic Surgery

## 2015-05-04 NOTE — Addendum Note (Signed)
Addendum  created 05/04/15 0820 by Lorrene Reid, MD   Modules edited: Anesthesia Blocks and Procedures, Clinical Notes   Clinical Notes:  File: [id number]

## 2015-07-25 ENCOUNTER — Emergency Department: Payer: No Typology Code available for payment source

## 2015-07-25 ENCOUNTER — Emergency Department
Admission: EM | Admit: 2015-07-25 | Discharge: 2015-07-25 | Disposition: A | Discharge status: Home / Self Care | Payer: No Typology Code available for payment source | Attending: Emergency Medicine | Admitting: Emergency Medicine

## 2015-07-25 ENCOUNTER — Encounter: Payer: Self-pay | Admitting: *Deleted

## 2015-07-25 DIAGNOSIS — Z791 Long term (current) use of non-steroidal anti-inflammatories (NSAID): Secondary | ICD-10-CM | POA: Diagnosis not present

## 2015-07-25 DIAGNOSIS — Z7984 Long term (current) use of oral hypoglycemic drugs: Secondary | ICD-10-CM | POA: Insufficient documentation

## 2015-07-25 DIAGNOSIS — S233XXA Sprain of ligaments of thoracic spine, initial encounter: Secondary | ICD-10-CM | POA: Diagnosis not present

## 2015-07-25 DIAGNOSIS — I10 Essential (primary) hypertension: Secondary | ICD-10-CM | POA: Insufficient documentation

## 2015-07-25 DIAGNOSIS — S29012A Strain of muscle and tendon of back wall of thorax, initial encounter: Secondary | ICD-10-CM | POA: Insufficient documentation

## 2015-07-25 DIAGNOSIS — F17210 Nicotine dependence, cigarettes, uncomplicated: Secondary | ICD-10-CM | POA: Diagnosis not present

## 2015-07-25 DIAGNOSIS — E119 Type 2 diabetes mellitus without complications: Secondary | ICD-10-CM | POA: Diagnosis not present

## 2015-07-25 DIAGNOSIS — S199XXA Unspecified injury of neck, initial encounter: Secondary | ICD-10-CM | POA: Diagnosis not present

## 2015-07-25 DIAGNOSIS — S39012A Strain of muscle, fascia and tendon of lower back, initial encounter: Secondary | ICD-10-CM | POA: Diagnosis not present

## 2015-07-25 DIAGNOSIS — Y9389 Activity, other specified: Secondary | ICD-10-CM | POA: Insufficient documentation

## 2015-07-25 DIAGNOSIS — S4992XA Unspecified injury of left shoulder and upper arm, initial encounter: Secondary | ICD-10-CM | POA: Insufficient documentation

## 2015-07-25 DIAGNOSIS — Y92410 Unspecified street and highway as the place of occurrence of the external cause: Secondary | ICD-10-CM | POA: Diagnosis not present

## 2015-07-25 DIAGNOSIS — R030 Elevated blood-pressure reading, without diagnosis of hypertension: Secondary | ICD-10-CM

## 2015-07-25 DIAGNOSIS — IMO0001 Reserved for inherently not codable concepts without codable children: Secondary | ICD-10-CM

## 2015-07-25 DIAGNOSIS — M25512 Pain in left shoulder: Secondary | ICD-10-CM

## 2015-07-25 DIAGNOSIS — Y998 Other external cause status: Secondary | ICD-10-CM | POA: Diagnosis not present

## 2015-07-25 DIAGNOSIS — Z79899 Other long term (current) drug therapy: Secondary | ICD-10-CM | POA: Insufficient documentation

## 2015-07-25 DIAGNOSIS — S29002A Unspecified injury of muscle and tendon of back wall of thorax, initial encounter: Secondary | ICD-10-CM | POA: Diagnosis present

## 2015-07-25 MED ORDER — HYDROCODONE-ACETAMINOPHEN 5-325 MG PO TABS: 1.0000 | ORAL_TABLET | ORAL | Status: DC | PRN
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
## 2015-07-25 NOTE — Discharge Instructions (Signed)
Cryotherapy °Cryotherapy means treatment with cold. Ice or gel packs can be used to reduce both pain and swelling. Ice is the most helpful within the first 24 to 48 hours after an injury or flare-up from overusing a muscle or joint. Sprains, strains, spasms, burning pain, shooting pain, and aches can all be eased with ice. Ice can also be used when recovering from surgery. Ice is effective, has very few side effects, and is safe for most people to use. °PRECAUTIONS  °Ice is not a safe treatment option for people with: °· Raynaud phenomenon. This is a condition affecting small blood vessels in the extremities. Exposure to cold may cause your problems to return. °· Cold hypersensitivity. There are many forms of cold hypersensitivity, including: °¨ Cold urticaria. Red, itchy hives appear on the skin when the tissues begin to warm after being iced. °¨ Cold erythema. This is a red, itchy rash caused by exposure to cold. °¨ Cold hemoglobinuria. Red blood cells break down when the tissues begin to warm after being iced. The hemoglobin that carry oxygen are passed into the urine because they cannot combine with blood proteins fast enough. °· Numbness or altered sensitivity in the area being iced. °If you have any of the following conditions, do not use ice until you have discussed cryotherapy with your caregiver: °· Heart conditions, such as arrhythmia, angina, or chronic heart disease. °· High blood pressure. °· Healing wounds or open skin in the area being iced. °· Current infections. °· Rheumatoid arthritis. °· Poor circulation. °· Diabetes. °Ice slows the blood flow in the region it is applied. This is beneficial when trying to stop inflamed tissues from spreading irritating chemicals to surrounding tissues. However, if you expose your skin to cold temperatures for too long or without the proper protection, you can damage your skin or nerves. Watch for signs of skin damage due to cold. °HOME CARE INSTRUCTIONS °Follow  these tips to use ice and cold packs safely. °· Place a dry or damp towel between the ice and skin. A damp towel will cool the skin more quickly, so you may need to shorten the time that the ice is used. °· For a more rapid response, add gentle compression to the ice. °· Ice for no more than 10 to 20 minutes at a time. The bonier the area you are icing, the less time it will take to get the benefits of ice. °· Check your skin after 5 minutes to make sure there are no signs of a poor response to cold or skin damage. °· Rest 20 minutes or more between uses. °· Once your skin is numb, you can end your treatment. You can test numbness by very lightly touching your skin. The touch should be so light that you do not see the skin dimple from the pressure of your fingertip. When using ice, most people will feel these normal sensations in this order: cold, burning, aching, and numbness. °· Do not use ice on someone who cannot communicate their responses to pain, such as small children or people with dementia. °HOW TO MAKE AN ICE PACK °Ice packs are the most common way to use ice therapy. Other methods include ice massage, ice baths, and cryosprays. Muscle creams that cause a cold, tingly feeling do not offer the same benefits that ice offers and should not be used as a substitute unless recommended by your caregiver. °To make an ice pack, do one of the following: °· Place crushed ice or a   bag of frozen vegetables in a sealable plastic bag. Squeeze out the excess air. Place this bag inside another plastic bag. Slide the bag into a pillowcase or place a damp towel between your skin and the bag.  Mix 3 parts water with 1 part rubbing alcohol. Freeze the mixture in a sealable plastic bag. When you remove the mixture from the freezer, it will be slushy. Squeeze out the excess air. Place this bag inside another plastic bag. Slide the bag into a pillowcase or place a damp towel between your skin and the bag. SEEK MEDICAL CARE  IF:  You develop white spots on your skin. This may give the skin a blotchy (mottled) appearance.  Your skin turns blue or pale.  Your skin becomes waxy or hard.  Your swelling gets worse. MAKE SURE YOU:   Understand these instructions.  Will watch your condition.  Will get help right away if you are not doing well or get worse.   This information is not intended to replace advice given to you by your health care provider. Make sure you discuss any questions you have with your health care provider.   Document Released: 04/07/2011 Document Revised: 09/01/2014 Document Reviewed: 04/07/2011 Elsevier Interactive Patient Education 2016 [place] with [place] if any continued problems.  Follow-up and have your blood pressure rechecked as it was elevated in the emergency room tonight. There is possibility that you'll need medication to help control this should it continued to remain elevated. Ice or moist heat to muscle areas as needed for soreness and pain. Norco as needed for pain. Beware that you cannot drive or operate machinery while taking this medication.

## 2016-07-10 ENCOUNTER — Emergency Department: Payer: Self-pay

## 2016-07-10 ENCOUNTER — Emergency Department
Admission: EM | Admit: 2016-07-10 | Discharge: 2016-07-10 | Disposition: A | Discharge status: Home / Self Care | Payer: Self-pay | Attending: Emergency Medicine | Admitting: Emergency Medicine

## 2016-07-10 ENCOUNTER — Encounter: Payer: Self-pay | Admitting: Emergency Medicine

## 2016-07-10 DIAGNOSIS — F17210 Nicotine dependence, cigarettes, uncomplicated: Secondary | ICD-10-CM | POA: Insufficient documentation

## 2016-07-10 DIAGNOSIS — Z7984 Long term (current) use of oral hypoglycemic drugs: Secondary | ICD-10-CM | POA: Insufficient documentation

## 2016-07-10 DIAGNOSIS — J069 Acute upper respiratory infection, unspecified: Secondary | ICD-10-CM | POA: Insufficient documentation

## 2016-07-10 DIAGNOSIS — E119 Type 2 diabetes mellitus without complications: Secondary | ICD-10-CM | POA: Insufficient documentation

## 2016-07-10 DIAGNOSIS — Z791 Long term (current) use of non-steroidal anti-inflammatories (NSAID): Secondary | ICD-10-CM | POA: Insufficient documentation

## 2016-07-10 DIAGNOSIS — I10 Essential (primary) hypertension: Secondary | ICD-10-CM | POA: Insufficient documentation

## 2016-07-10 DIAGNOSIS — Z79899 Other long term (current) drug therapy: Secondary | ICD-10-CM | POA: Insufficient documentation

## 2016-07-10 MED ORDER — IPRATROPIUM-ALBUTEROL 0.5-2.5 (3) MG/3ML IN SOLN
3.0000 mL | Freq: Once | RESPIRATORY_TRACT | Status: AC
  Administered 2016-07-10: 3 mL via RESPIRATORY_TRACT
  Filled 2016-07-10: qty 3

## 2016-07-10 MED ORDER — AMLODIPINE BESYLATE 10 MG PO TABS: 10.0000 mg | ORAL_TABLET | Freq: Every day | ORAL | 0 refills | Status: DC

## 2016-07-10 NOTE — ED Notes (Signed)
Dr kinner at bedside. 

## 2016-07-10 NOTE — ED Triage Notes (Signed)
Patient ambulatory to triage with steady gait, without difficulty or distress noted; pt reports congestion and prod cough green sputum x 3-4 days

## 2016-07-10 NOTE — ED Notes (Addendum)
Pt discharged home after verbalizing understanding of discharge instructions; nad noted.  Pt's BP elevated and he indicated that he is out of medication. Requested and received prescription from Dr. Corky Downs.

## 2016-07-10 NOTE — ED Notes (Signed)
Patient transported to X-ray 

## 2016-07-10 NOTE — ED Provider Notes (Signed)
[facility] Emergency Department Provider Note   ____________________________________________    I have reviewed the triage vital signs and the nursing notes.   HISTORY  Chief Complaint Nasal Congestion and Cough     HPI Dan [name] is a 50 y.o. male who presents with complaints of cough, nasal congestion, body aches. He reports it started 3 days ago. He denies shortness breath. He does smoke cigarettes. Denies fevers. No chest pain. No pleurisy.   Past Medical History:  Diagnosis Date  . Acid reflux   . Diabetes mellitus without complication ([place])   . Hypertension     There are no active problems to display for this patient.   Past Surgical History:  Procedure Laterality Date  . FRACTURE SURGERY Left at age 88   pelvic fracture  . pelvis fractur      Prior to Admission medications   Medication Sig Start Date End Date Taking? Authorizing Provider  acetaminophen (TYLENOL) 325 MG tablet Take 650 mg by mouth every 6 (six) hours as needed (cold symptoms).    Historical Provider, MD  albuterol (PROVENTIL) (2.5 MG/3ML) 0.083% nebulizer solution Take 3 mLs (2.5 mg total) by nebulization every 6 (six) hours as needed for wheezing or shortness of breath. 05/17/14   Lacretia Leigh, MD  amLODipine (NORVASC) 10 MG tablet Take 1 tablet (10 mg total) by mouth daily. 05/17/14   Lacretia Leigh, MD  HYDROcodone-acetaminophen (NORCO/VICODIN) 5-325 MG tablet Take 1 tablet by mouth every 4 (four) hours as needed for moderate pain. 07/25/15   Johnn Hai, PA-C  ibuprofen (ADVIL,MOTRIN) 200 MG tablet Take 800 mg by mouth daily as needed for headache (headache).     Historical Provider, MD  meloxicam (MOBIC) 7.5 MG tablet Take 7.5 mg by mouth daily.    Historical Provider, MD  metFORMIN (GLUCOPHAGE) 500 MG tablet Take 1,000 mg by mouth 2 (two) times daily with a meal.    Historical Provider, MD  omeprazole (PRILOSEC) 20 MG capsule Take 20 mg by mouth daily.     Historical Provider, MD  oxyCODONE-acetaminophen (PERCOCET/ROXICET) 5-325 MG per tablet Take 1 tablet by mouth every 4 (four) hours as needed for moderate pain or severe pain (headache).    Historical Provider, MD  oxyCODONE-acetaminophen (PERCOCET/ROXICET) 5-325 MG per tablet Take 1-2 tablets by mouth every 6 (six) hours as needed for severe pain. 04/20/15   Gary Fleet, PA-C  ranitidine (ZANTAC) 150 MG tablet Take 150 mg by mouth daily.    Historical Provider, MD     Allergies Ibuprofen and Tramadol  Family History  Problem Relation Age of Onset  . Cancer Sister   . Diabetes Brother   . Hypertension Brother     Social History Social History  Substance Use Topics  . Smoking status: Current Every Day Smoker    Packs/day: 1.00    Years: 25.00    Types: Cigarettes  . Smokeless tobacco: Never Used  . Alcohol use 3.6 oz/week    6 Cans of beer per week     Comment: weekends only    Review of Systems  Constitutional: No fever/chills  ENT: Mild sore throat   Gastrointestinal: No abdominal pain.  No nausea, no vomiting.   Genitourinary: Negative for dysuria. Musculoskeletal: Negative for back pain. Skin: Negative for rash. Neurological: Negative for headaches     ____________________________________________   PHYSICAL EXAM:  VITAL SIGNS: ED Triage Vitals  Enc Vitals Group     BP 07/10/16 0626 (!) 177/104  Pulse Rate 07/10/16 0626 81     Resp 07/10/16 0626 16     Temp 07/10/16 0626 98.3 F (36.8 C)     Temp Source 07/10/16 0626 Oral     SpO2 07/10/16 0626 98 %     Weight 07/10/16 0620 200 lb (90.7 kg)     Height 07/10/16 0620 6' (1.829 m)     Head Circumference --      Peak Flow --      Pain Score 07/10/16 0652 6     Pain Loc --      Pain Edu? --      Excl. in [place]? --     Constitutional: Alert and oriented. No acute distress.  Eyes: Conjunctivae are normal.   Nose: Positive congestion Mouth/Throat: Mucous membranes are moist.   Cardiovascular:  Normal rate, regular rhythm.  Respiratory: Normal respiratory effort.  No retractions.No rales Genitourinary: deferred Musculoskeletal: No lower extremity tenderness nor edema.   Neurologic:  Normal speech and language.    Skin:  Skin is warm, dry and intact. No rash noted.   ____________________________________________   LABS (all labs ordered are listed, but only abnormal results are displayed)  Labs Reviewed - No data to display ____________________________________________  EKG   ____________________________________________  RADIOLOGY  Chest x-ray unremarkable ____________________________________________   PROCEDURES  Procedure(s) performed: No    Critical Care performed: No ____________________________________________   INITIAL IMPRESSION / ASSESSMENT AND PLAN / ED COURSE  Pertinent labs & imaging results that were available during my care of the patient were reviewed by me and considered in my medical decision making (see chart for details).  Patient well-appearing in no distress. He does have a history of hypertension. We discussed the need for follow-up for his continued hypertension. Chest x-ray is unremarkable. Exam is benign and consistent with upper respiratory infection, likely viral which is rampant in the clinic at this time. DuoNeb given in the emergency department given his history of smoking for symptom relief. Recommend supportive care. Return cautioned discussed   ____________________________________________   FINAL CLINICAL IMPRESSION(S) / ED DIAGNOSES  Final diagnoses:  Viral upper respiratory tract infection      NEW MEDICATIONS STARTED DURING THIS VISIT:  New Prescriptions   No medications on file     Note:  This document was prepared using Dragon voice recognition software and may include unintentional dictation errors.    Lavonia Drafts, MD 07/10/16 [phone]

## 2016-09-02 ENCOUNTER — Emergency Department
Admission: EM | Admit: 2016-09-02 | Discharge: 2016-09-02 | Disposition: A | Discharge status: Home / Self Care | Payer: Managed Care, Other (non HMO) | Attending: Emergency Medicine | Admitting: Emergency Medicine

## 2016-09-02 ENCOUNTER — Encounter: Payer: Self-pay | Admitting: *Deleted

## 2016-09-02 DIAGNOSIS — F17210 Nicotine dependence, cigarettes, uncomplicated: Secondary | ICD-10-CM | POA: Diagnosis not present

## 2016-09-02 DIAGNOSIS — Z79899 Other long term (current) drug therapy: Secondary | ICD-10-CM | POA: Insufficient documentation

## 2016-09-02 DIAGNOSIS — R0981 Nasal congestion: Secondary | ICD-10-CM | POA: Diagnosis present

## 2016-09-02 DIAGNOSIS — R05 Cough: Secondary | ICD-10-CM | POA: Diagnosis not present

## 2016-09-02 DIAGNOSIS — E119 Type 2 diabetes mellitus without complications: Secondary | ICD-10-CM | POA: Diagnosis not present

## 2016-09-02 DIAGNOSIS — I10 Essential (primary) hypertension: Secondary | ICD-10-CM | POA: Diagnosis not present

## 2016-09-02 DIAGNOSIS — Z7984 Long term (current) use of oral hypoglycemic drugs: Secondary | ICD-10-CM | POA: Diagnosis not present

## 2016-09-02 DIAGNOSIS — R52 Pain, unspecified: Secondary | ICD-10-CM | POA: Diagnosis not present

## 2016-09-02 DIAGNOSIS — J3489 Other specified disorders of nose and nasal sinuses: Secondary | ICD-10-CM | POA: Insufficient documentation

## 2016-09-02 DIAGNOSIS — R6889 Other general symptoms and signs: Secondary | ICD-10-CM

## 2016-09-02 MED ORDER — BENZONATATE 100 MG PO CAPS: 200.0000 mg | ORAL_CAPSULE | Freq: Three times a day (TID) | ORAL | 0 refills | Status: DC | PRN

## 2016-09-02 MED ORDER — FEXOFENADINE-PSEUDOEPHED ER 60-120 MG PO TB12: 1.0000 | ORAL_TABLET | Freq: Two times a day (BID) | ORAL | 0 refills | Status: DC

## 2016-09-02 NOTE — ED Provider Notes (Signed)
[facility] Emergency Department Provider Note   ____________________________________________   None    (approximate)  I have reviewed the triage vital signs and the nursing notes.   HISTORY  Chief Complaint Fever; Generalized Body Aches; and Nasal Congestion    HPI Dan [name] is a 51 y.o. male patient complaining of body aches, cough, nasal congestion and chills for 1 week. He is unsure of fever. Patient denies nausea vomiting diarrhea. Patient states does not take a flu shot this season. Patient also states no palliative measures for this complaint.Patient rates his pain as a 9/10. Patient described a pain as "achy".   Past Medical History:  Diagnosis Date  . Acid reflux   . Diabetes mellitus without complication ([place])   . Hypertension     There are no active problems to display for this patient.   Past Surgical History:  Procedure Laterality Date  . FRACTURE SURGERY Left at age 42   pelvic fracture  . pelvis fractur      Prior to Admission medications   Medication Sig Start Date End Date Taking? Authorizing Provider  acetaminophen (TYLENOL) 325 MG tablet Take 650 mg by mouth every 6 (six) hours as needed (cold symptoms).    Historical Provider, MD  albuterol (PROVENTIL) (2.5 MG/3ML) 0.083% nebulizer solution Take 3 mLs (2.5 mg total) by nebulization every 6 (six) hours as needed for wheezing or shortness of breath. 05/17/14   Lacretia Leigh, MD  amLODipine (NORVASC) 10 MG tablet Take 1 tablet (10 mg total) by mouth daily. 07/10/16   Lavonia Drafts, MD  benzonatate (TESSALON) 100 MG capsule Take 2 capsules (200 mg total) by mouth 3 (three) times daily as needed for cough. 09/02/16   Sable Feil, PA-C  fexofenadine-pseudoephedrine (ALLEGRA-D) 60-120 MG 12 hr tablet Take 1 tablet by mouth 2 (two) times daily. 09/02/16   Sable Feil, PA-C  HYDROcodone-acetaminophen (NORCO/VICODIN) 5-325 MG tablet Take 1 tablet by mouth every 4 (four) hours as  needed for moderate pain. 07/25/15   Johnn Hai, PA-C  ibuprofen (ADVIL,MOTRIN) 200 MG tablet Take 800 mg by mouth daily as needed for headache (headache).     Historical Provider, MD  meloxicam (MOBIC) 7.5 MG tablet Take 7.5 mg by mouth daily.    Historical Provider, MD  metFORMIN (GLUCOPHAGE) 500 MG tablet Take 1,000 mg by mouth 2 (two) times daily with a meal.    Historical Provider, MD  omeprazole (PRILOSEC) 20 MG capsule Take 20 mg by mouth daily.    Historical Provider, MD  oxyCODONE-acetaminophen (PERCOCET/ROXICET) 5-325 MG per tablet Take 1 tablet by mouth every 4 (four) hours as needed for moderate pain or severe pain (headache).    Historical Provider, MD  oxyCODONE-acetaminophen (PERCOCET/ROXICET) 5-325 MG per tablet Take 1-2 tablets by mouth every 6 (six) hours as needed for severe pain. 04/20/15   Gary Fleet, PA-C  ranitidine (ZANTAC) 150 MG tablet Take 150 mg by mouth daily.    Historical Provider, MD    Allergies Ibuprofen and Tramadol  Family History  Problem Relation Age of Onset  . Cancer Sister   . Diabetes Brother   . Hypertension Brother     Social History Social History  Substance Use Topics  . Smoking status: Current Every Day Smoker    Packs/day: 1.00    Years: 25.00    Types: Cigarettes  . Smokeless tobacco: Never Used  . Alcohol use 3.6 oz/week    6 Cans of beer per week  Comment: weekends only    Review of Systems Constitutional:Chills and body aches. Eyes: No visual changes. [id number] congestion and runny nose.  Cardiovascular: Denies chest pain. Respiratory: Denies shortness of breath.Nonproductive cough  Gastrointestinal: No abdominal pain.  No nausea, no vomiting.  No diarrhea.  No constipation. Genitourinary: Negative for dysuria. Musculoskeletal: Negative for back pain. Skin: Negative for rash. Neurological: Negative for headaches, focal weakness or numbness.    ____________________________________________   PHYSICAL  EXAM:  VITAL SIGNS: ED Triage Vitals  Enc Vitals Group     BP 09/02/16 0908 (!) 165/114     Pulse Rate 09/02/16 0908 87     Resp 09/02/16 0908 18     Temp 09/02/16 0908 98.2 F (36.8 C)     Temp Source 09/02/16 0908 Oral     SpO2 09/02/16 0908 99 %     Weight 09/02/16 0908 200 lb (90.7 kg)     Height 09/02/16 0908 6' (1.829 m)     Head Circumference --      Peak Flow --      Pain Score 09/02/16 0909 9     Pain Loc --      Pain Edu? --      Excl. in [place]? --     Constitutional: Alert and oriented. Well appearing and in no acute distress. Eyes: Conjunctivae are normal. PERRL. EOMI. Head: Atraumatic. Nose: Edematous nasal turbinates with clear rhinorrhea. Bilateral maxillary guarding. Mouth/Throat: Mucous membranes are moist.  Oropharynx non-erythematous. Neck: No stridor.  No cervical spine tenderness to palpation. Hematological/Lymphatic/Immunilogical: No cervical lymphadenopathy. Cardiovascular: Normal rate, regular rhythm. Grossly normal heart sounds.  Good peripheral circulation.Elevated blood pressure  Respiratory: Normal respiratory effort.  No retractions. Lungs CTAB. Nonproductive cough  Gastrointestinal: Soft and nontender. No distention. No abdominal bruits. No CVA tenderness. Musculoskeletal: No lower extremity tenderness nor edema.  No joint effusions. Neurologic:  Normal speech and language. No gross focal neurologic deficits are appreciated. No gait instability. Skin:  Skin is warm, dry and intact. No rash noted. Psychiatric: Mood and affect are normal. Speech and behavior are normal.  ____________________________________________   LABS (all labs ordered are listed, but only abnormal results are displayed)  Labs Reviewed - No data to display ____________________________________________  EKG   ____________________________________________  RADIOLOGY   ____________________________________________   PROCEDURES  Procedure(s) performed:  None  Procedures  Critical Care performed: No  ____________________________________________   INITIAL IMPRESSION / ASSESSMENT AND PLAN / ED COURSE  Pertinent labs & imaging results that were available during my care of the patient were reviewed by me and considered in my medical decision making (see chart for details).  Viral illness. Given discharge care instructions. Patient is a prescription for Allegra-D, Tessalon Perles, advised to take extra Tylenol for body aches.  Clinical Course      ____________________________________________   FINAL CLINICAL IMPRESSION(S) / ED DIAGNOSES  Final diagnoses:  Flu-like symptoms      NEW MEDICATIONS STARTED DURING THIS VISIT:  New Prescriptions   BENZONATATE (TESSALON) 100 MG CAPSULE    Take 2 capsules (200 mg total) by mouth 3 (three) times daily as needed for cough.   FEXOFENADINE-PSEUDOEPHEDRINE (ALLEGRA-D) 60-120 MG 12 HR TABLET    Take 1 tablet by mouth 2 (two) times daily.     Note:  This document was prepared using Dragon voice recognition software and may include unintentional dictation errors.    Sable Feil, PA-C 09/02/16 [place] Yao, MD 09/02/16 [phone]

## 2016-09-02 NOTE — ED Triage Notes (Signed)
Pt states body aches, cough, nasal congestion and chills for 1 week

## 2016-09-02 NOTE — ED Notes (Signed)
Pt in via triage, pt reports sore throat, head congestion, body aches and chills x approximately one week.  Pt denies taking any OTC medication, states, "that never works."  Pt ambulatory to room, no immediate distress noted at this time.

## 2016-09-20 ENCOUNTER — Emergency Department
Admission: EM | Admit: 2016-09-20 | Discharge: 2016-09-21 | Disposition: A | Discharge status: Home / Self Care | Payer: Managed Care, Other (non HMO) | Attending: Emergency Medicine | Admitting: Emergency Medicine

## 2016-09-20 ENCOUNTER — Emergency Department: Payer: Managed Care, Other (non HMO)

## 2016-09-20 DIAGNOSIS — R0602 Shortness of breath: Secondary | ICD-10-CM | POA: Diagnosis present

## 2016-09-20 DIAGNOSIS — Z7984 Long term (current) use of oral hypoglycemic drugs: Secondary | ICD-10-CM | POA: Insufficient documentation

## 2016-09-20 DIAGNOSIS — J019 Acute sinusitis, unspecified: Secondary | ICD-10-CM

## 2016-09-20 DIAGNOSIS — I10 Essential (primary) hypertension: Secondary | ICD-10-CM | POA: Diagnosis not present

## 2016-09-20 DIAGNOSIS — E119 Type 2 diabetes mellitus without complications: Secondary | ICD-10-CM | POA: Diagnosis not present

## 2016-09-20 DIAGNOSIS — R0989 Other specified symptoms and signs involving the circulatory and respiratory systems: Secondary | ICD-10-CM

## 2016-09-20 DIAGNOSIS — F17210 Nicotine dependence, cigarettes, uncomplicated: Secondary | ICD-10-CM | POA: Diagnosis not present

## 2016-09-20 DIAGNOSIS — Z79899 Other long term (current) drug therapy: Secondary | ICD-10-CM | POA: Insufficient documentation

## 2016-09-20 NOTE — ED Notes (Signed)
Patient ambulatory to stat registration without difficulty or distress noted.  Patient reports chest congestion, felling like throat closing and short of breath.  Patient is speaking in complete sentences.

## 2016-09-20 NOTE — ED Triage Notes (Signed)
Patient reports feeling short of breath and having chest congestion.  Reports here approximately 2 weeks ago with similar symptoms and prescribed allergra d which patient states did not help.

## 2016-09-21 LAB — INFLUENZA PANEL BY PCR (TYPE A & B)
Influenza A By PCR: NEGATIVE
Influenza B By PCR: NEGATIVE

## 2016-09-21 LAB — BASIC METABOLIC PANEL
Anion gap: 8 (ref 5–15)
BUN: 14 mg/dL (ref 6–20)
CALCIUM: 8.9 mg/dL (ref 8.9–10.3)
CO2: 25 mmol/L (ref 22–32)
Chloride: 105 mmol/L (ref 101–111)
Creatinine, Ser: 0.8 mg/dL (ref 0.61–1.24)
GFR calc Af Amer: >60 mL/min (ref >60)
GFR calc non Af Amer: >60 mL/min (ref >60)
GLUCOSE: 95 mg/dL (ref 65–99)
Potassium: 3.5 mmol/L (ref 3.5–5.1)
Sodium: 138 mmol/L (ref 135–145)

## 2016-09-21 LAB — CBC
HCT: 37 % — ABNORMAL LOW (ref 40.0–52.0)
Hemoglobin: 13.3 g/dL (ref 13.0–18.0)
MCH: 30.1 pg (ref 26.0–34.0)
MCHC: 35.9 g/dL (ref 32.0–36.0)
MCV: 83.8 fL (ref 80.0–100.0)
Platelets: 219 10*3/uL (ref 150–440)
RBC: 4.42 MIL/uL (ref 4.40–5.90)
RDW: 14.3 % (ref 11.5–14.5)
WBC: 5.9 10*3/uL (ref 3.8–10.6)

## 2016-09-21 LAB — MONONUCLEOSIS SCREEN: Mono Screen: NEGATIVE

## 2016-09-21 LAB — POCT RAPID STREP A: Streptococcus, Group A Screen (Direct): NEGATIVE

## 2016-09-21 LAB — TROPONIN I: Troponin I: <0.03 ng/mL (ref <0.03)

## 2016-09-21 MED ORDER — AMOXICILLIN-POT CLAVULANATE 875-125 MG PO TABS: 1.0000 | ORAL_TABLET | Freq: Two times a day (BID) | ORAL | 0 refills | Status: AC

## 2016-09-21 MED ORDER — PREDNISONE 20 MG PO TABS: 60.0000 mg | ORAL_TABLET | Freq: Every day | ORAL | 0 refills | Status: DC

## 2016-09-21 MED ORDER — IPRATROPIUM-ALBUTEROL 0.5-2.5 (3) MG/3ML IN SOLN
3.0000 mL | Freq: Once | RESPIRATORY_TRACT | Status: AC
  Administered 2016-09-21: 3 mL via RESPIRATORY_TRACT
  Filled 2016-09-21: qty 3

## 2016-09-21 MED ORDER — BENZONATATE 100 MG PO CAPS: 100.0000 mg | ORAL_CAPSULE | Freq: Four times a day (QID) | ORAL | 0 refills | Status: DC | PRN

## 2016-09-21 MED ORDER — OXYMETAZOLINE HCL 0.05 % NA SOLN
1.0000 | Freq: Once | NASAL | Status: AC
  Administered 2016-09-21: 1 via NASAL
  Filled 2016-09-21: qty 15

## 2016-09-21 MED ORDER — ACETAMINOPHEN 325 MG PO TABS
650.0000 mg | ORAL_TABLET | Freq: Once | ORAL | Status: AC
  Administered 2016-09-21: 650 mg via ORAL
  Filled 2016-09-21: qty 2

## 2016-09-21 MED ORDER — PREDNISONE 20 MG PO TABS
60.0000 mg | ORAL_TABLET | Freq: Once | ORAL | Status: AC
  Administered 2016-09-21: 60 mg via ORAL
  Filled 2016-09-21: qty 3

## 2016-09-21 NOTE — Discharge Instructions (Signed)
Please follow up with your primary care physician.

## 2016-09-21 NOTE — ED Notes (Signed)
Pt states his wife has been diagnosed with the flu.

## 2016-09-21 NOTE — ED Notes (Signed)

## 2016-09-21 NOTE — ED Provider Notes (Signed)
[facility] Emergency Department Provider Note   ____________________________________________   First MD Initiated Contact with Patient 09/20/16 2357     (approximate)  I have reviewed the triage vital signs and the nursing notes.   HISTORY  Chief Complaint Shortness of Breath    HPI Dan [name] is a 51 y.o. male who comes into the hospital today because he feels like his throat is closing up. He has had some chest and nasal congestion. He reports that he has been coughing up green stuff. He was here 2 weeks ago and was given Allegra-D which did not help. The patient reports that he saw his primary care physician last week and was given a Z-Pak which she has completed. He's had some sweats so he wonders if he's had fevers. He reports that he's also has some chest pain sore throat and a cough. The chest pain is worse with the cough. He reports that his wife is sick as well. The patient denies nausea or vomiting but has had a mild headache and some blurred vision. He is here today for evaluation.   Past Medical History:  Diagnosis Date  . Acid reflux   . Diabetes mellitus without complication ([place])   . Hypertension     There are no active problems to display for this patient.   Past Surgical History:  Procedure Laterality Date  . FRACTURE SURGERY Left at age 44   pelvic fracture  . pelvis fractur      Prior to Admission medications   Medication Sig Start Date End Date Taking? Authorizing Provider  acetaminophen (TYLENOL) 325 MG tablet Take 650 mg by mouth every 6 (six) hours as needed (cold symptoms).    Historical Provider, MD  albuterol (PROVENTIL) (2.5 MG/3ML) 0.083% nebulizer solution Take 3 mLs (2.5 mg total) by nebulization every 6 (six) hours as needed for wheezing or shortness of breath. 05/17/14   Lacretia Leigh, MD  amLODipine (NORVASC) 10 MG tablet Take 1 tablet (10 mg total) by mouth daily. 07/10/16   Lavonia Drafts, MD    amoxicillin-clavulanate (AUGMENTIN) 875-125 MG tablet Take 1 tablet by mouth 2 (two) times daily. 09/21/16 10/01/16  Loney Hering, MD  benzonatate (TESSALON PERLES) 100 MG capsule Take 1 capsule (100 mg total) by mouth every 6 (six) hours as needed for cough. 09/21/16   Loney Hering, MD  benzonatate (TESSALON) 100 MG capsule Take 2 capsules (200 mg total) by mouth 3 (three) times daily as needed for cough. 09/02/16   Sable Feil, PA-C  fexofenadine-pseudoephedrine (ALLEGRA-D) 60-120 MG 12 hr tablet Take 1 tablet by mouth 2 (two) times daily. 09/02/16   Sable Feil, PA-C  HYDROcodone-acetaminophen (NORCO/VICODIN) 5-325 MG tablet Take 1 tablet by mouth every 4 (four) hours as needed for moderate pain. 07/25/15   Johnn Hai, PA-C  ibuprofen (ADVIL,MOTRIN) 200 MG tablet Take 800 mg by mouth daily as needed for headache (headache).     Historical Provider, MD  meloxicam (MOBIC) 7.5 MG tablet Take 7.5 mg by mouth daily.    Historical Provider, MD  metFORMIN (GLUCOPHAGE) 500 MG tablet Take 1,000 mg by mouth 2 (two) times daily with a meal.    Historical Provider, MD  omeprazole (PRILOSEC) 20 MG capsule Take 20 mg by mouth daily.    Historical Provider, MD  oxyCODONE-acetaminophen (PERCOCET/ROXICET) 5-325 MG per tablet Take 1 tablet by mouth every 4 (four) hours as needed for moderate pain or severe pain (headache).  Historical Provider, MD  oxyCODONE-acetaminophen (PERCOCET/ROXICET) 5-325 MG per tablet Take 1-2 tablets by mouth every 6 (six) hours as needed for severe pain. 04/20/15   Gary Fleet, PA-C  predniSONE (DELTASONE) 20 MG tablet Take 3 tablets (60 mg total) by mouth daily. 09/21/16   Loney Hering, MD  ranitidine (ZANTAC) 150 MG tablet Take 150 mg by mouth daily.    Historical Provider, MD    Allergies Ibuprofen and Tramadol  Family History  Problem Relation Age of Onset  . Cancer Sister   . Diabetes Brother   . Hypertension Brother     Social History Social History   Substance Use Topics  . Smoking status: Current Every Day Smoker    Packs/day: 1.00    Years: 25.00    Types: Cigarettes  . Smokeless tobacco: Never Used  . Alcohol use 3.6 oz/week    6 Cans of beer per week     Comment: weekends only    Review of Systems Constitutional: No fever/chills Eyes: No visual changes. ENT: Nasal congestion and sore throat Cardiovascular:  chest pain. Respiratory: Cough Gastrointestinal: No abdominal pain.  No nausea, no vomiting.  No diarrhea.  No constipation. Genitourinary: Negative for dysuria. Musculoskeletal: Negative for back pain. Skin: Negative for rash. Neurological: Headache  10-point ROS otherwise negative.  ____________________________________________   PHYSICAL EXAM:  VITAL SIGNS: ED Triage Vitals [09/20/16 2318]  Enc Vitals Group     BP (!) 161/110     Pulse Rate 71     Resp 20     Temp 98.7 F (37.1 C)     Temp Source Oral     SpO2 97 %     Weight      Height      Head Circumference      Peak Flow      Pain Score      Pain Loc      Pain Edu?      Excl. in [place]?     Constitutional: Alert and oriented. Well appearing and in Mild distress. Eyes: Conjunctivae are normal. PERRL. EOMI. Head: Atraumatic. Nose: No congestion/rhinnorhea. Tender to palpation over frontal, maxillary and sphenoid sinuses Mouth/Throat: Mucous membranes are moist.  Oropharynx non-erythematous. Cardiovascular: Normal rate, regular rhythm. Grossly normal heart sounds.  Good peripheral circulation. Respiratory: Normal respiratory effort.  No retractions. Lungs CTAB. Gastrointestinal: Soft and nontender. No distention. Positive bowel sounds  Musculoskeletal: No lower extremity tenderness nor edema.   Neurologic:  Normal speech and language.  Skin:  Skin is warm, dry and intact.  Psychiatric: Mood and affect are normal.   ____________________________________________   LABS (all labs ordered are listed, but only abnormal results are  displayed)  Labs Reviewed  CBC - Abnormal; Notable for the following:       Result Value   HCT 37.0 (*)    All other components within normal limits  BASIC METABOLIC PANEL  TROPONIN I  MONONUCLEOSIS SCREEN  INFLUENZA PANEL BY PCR (TYPE A & B)  POCT RAPID STREP A   ____________________________________________  EKG  ED ECG REPORT I, Loney Hering, the attending physician, personally viewed and interpreted this ECG.   Date: 09/21/2016  EKG Time: 147  Rate: 84  Rhythm: normal sinus rhythm  Axis: normal  Intervals:none  ST&T Change: none  ____________________________________________  RADIOLOGY  CXR ____________________________________________   PROCEDURES  Procedure(s) performed: None  Procedures  Critical Care performed: No  ____________________________________________   INITIAL IMPRESSION / ASSESSMENT AND PLAN / ED  COURSE  Pertinent labs & imaging results that were available during my care of the patient were reviewed by me and considered in my medical decision making (see chart for details).  This is a 51 year old male who comes into the hospital today with some sensation of throat swelling, cough, congestion and chest pain. The patient has been having some of these symptoms for multiple weeks. He has been on decongestant medication as well as on a Z-Pak. The patient's arms that he may have sinusitis. I will give him a DuoNeb as well as prednisone. I will also give the patient some Afrin to the nose and I will check some blood work. I will reassess the patient once I received the results of his blood work.  Clinical Course as of Sep 21 153  Sun Sep 21, 2016  0007 No active cardiopulmonary disease. DG Chest 2 View [AW]    Clinical Course User Index [AW] Loney Hering, MD   The patient's blood work is unremarkable. His Mono strep and flu are negative. I feel that the patient has some sinusitis. He will be discharged home with some medication to help  with the symptoms and he should follow back up with his primary care physician.  ____________________________________________   FINAL CLINICAL IMPRESSION(S) / ED DIAGNOSES  Final diagnoses:  Subacute sinusitis, unspecified location  Chest congestion      NEW MEDICATIONS STARTED DURING THIS VISIT:  New Prescriptions   AMOXICILLIN-CLAVULANATE (AUGMENTIN) 875-125 MG TABLET    Take 1 tablet by mouth 2 (two) times daily.   BENZONATATE (TESSALON PERLES) 100 MG CAPSULE    Take 1 capsule (100 mg total) by mouth every 6 (six) hours as needed for cough.   PREDNISONE (DELTASONE) 20 MG TABLET    Take 3 tablets (60 mg total) by mouth daily.     Note:  This document was prepared using Dragon voice recognition software and may include unintentional dictation errors.    Loney Hering, MD 09/21/16 [phone]

## 2017-04-01 ENCOUNTER — Emergency Department: Payer: Managed Care, Other (non HMO)

## 2017-04-01 ENCOUNTER — Emergency Department
Admission: EM | Admit: 2017-04-01 | Discharge: 2017-04-01 | Disposition: A | Discharge status: Home / Self Care | Payer: Managed Care, Other (non HMO) | Attending: Emergency Medicine | Admitting: Emergency Medicine

## 2017-04-01 DIAGNOSIS — R05 Cough: Secondary | ICD-10-CM

## 2017-04-01 DIAGNOSIS — I10 Essential (primary) hypertension: Secondary | ICD-10-CM | POA: Diagnosis not present

## 2017-04-01 DIAGNOSIS — J40 Bronchitis, not specified as acute or chronic: Secondary | ICD-10-CM | POA: Diagnosis not present

## 2017-04-01 DIAGNOSIS — E119 Type 2 diabetes mellitus without complications: Secondary | ICD-10-CM | POA: Diagnosis not present

## 2017-04-01 DIAGNOSIS — Z7984 Long term (current) use of oral hypoglycemic drugs: Secondary | ICD-10-CM | POA: Diagnosis not present

## 2017-04-01 DIAGNOSIS — Z79899 Other long term (current) drug therapy: Secondary | ICD-10-CM | POA: Insufficient documentation

## 2017-04-01 DIAGNOSIS — J029 Acute pharyngitis, unspecified: Secondary | ICD-10-CM

## 2017-04-01 DIAGNOSIS — R059 Cough, unspecified: Secondary | ICD-10-CM

## 2017-04-01 DIAGNOSIS — F17210 Nicotine dependence, cigarettes, uncomplicated: Secondary | ICD-10-CM | POA: Diagnosis not present

## 2017-04-01 MED ORDER — LEVOFLOXACIN 750 MG PO TABS: 750.0000 mg | ORAL_TABLET | Freq: Every day | ORAL | 0 refills | Status: AC

## 2017-04-01 MED ORDER — ALBUTEROL SULFATE HFA 108 (90 BASE) MCG/ACT IN AERS: 2.0000 | INHALATION_SPRAY | Freq: Four times a day (QID) | RESPIRATORY_TRACT | 0 refills | Status: DC | PRN

## 2017-04-01 MED ORDER — DEXTROMETHORPHAN-BENZOCAINE 10-15 MG MT LOZG: 1.0000 | LOZENGE | Freq: Four times a day (QID) | OROMUCOSAL | 0 refills | Status: AC | PRN

## 2017-04-01 MED ORDER — PREDNISONE 10 MG (21) PO TBPK: ORAL_TABLET | ORAL | 0 refills | Status: DC

## 2017-04-01 NOTE — Discharge Instructions (Signed)
Take medication as prescribed. Return to emergency department if symptoms worsen and follow-up with PCP as needed.    Call to make an appointment with her primary care follow-up in approximately 2 weeks.

## 2017-04-01 NOTE — ED Provider Notes (Signed)
[facility] Emergency Department Provider Note   ____________________________________________   I have reviewed the triage vital signs and the nursing notes.   HISTORY  Chief Complaint No chief complaint on file.    HPI Dan [name] is a 51 y.o. male presents to emergency department with sore throat, cough, congestion, shortness of breath, fatigue for 1 week. Patient denies fever, chills, nausea or vomiting associated with the above symptoms. Patient reports similar episode approximately 6 months ago that he was treated with antibiotics, prednisone and decongestants. 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 Patient reports the episode 6 months ago he fully recovered. Patient denies fever, chills, headache, vision changes, chest pain, chest tightness, shortness of breath, abdominal pain, nausea and vomiting.  Past Medical History:  Diagnosis Date  . Acid reflux   . Diabetes mellitus without complication ([place])   . Hypertension     There are no active problems to display for this patient.   Past Surgical History:  Procedure Laterality Date  . FRACTURE SURGERY Left at age 41   pelvic fracture  . pelvis fractur      Prior to Admission medications   Medication Sig Start Date End Date Taking? Authorizing Provider  acetaminophen (TYLENOL) 325 MG tablet Take 650 mg by mouth every 6 (six) hours as needed (cold symptoms).    [provider]  albuterol (PROVENTIL HFA;VENTOLIN HFA) 108 (90 Base) MCG/ACT inhaler Inhale 2 puffs into the lungs every 6 (six) hours as needed for wheezing or shortness of breath. 04/01/17   Jaree Dwight M, PA-C  albuterol (PROVENTIL) (2.5 MG/3ML) 0.083% nebulizer solution Take 3 mLs (2.5 mg total) by nebulization every 6 (six) hours as needed for wheezing or shortness of breath. 05/17/14   Lacretia Leigh, MD  amLODipine (NORVASC) 10 MG tablet Take 1 tablet (10 mg total) by mouth daily.  07/10/16   Lavonia Drafts, MD  benzonatate (TESSALON PERLES) 100 MG capsule Take 1 capsule (100 mg total) by mouth every 6 (six) hours as needed for cough. 09/21/16   Loney Hering, MD  benzonatate (TESSALON) 100 MG capsule Take 2 capsules (200 mg total) by mouth 3 (three) times daily as needed for cough. 09/02/16   Sable Feil, PA-C  Dextromethorphan-Benzocaine 10-15 MG LOZG Use as directed 1 lozenge in the mouth or throat every 6 (six) hours as needed. 04/01/17 04/08/17  Shirlette Scarber M, PA-C  fexofenadine-pseudoephedrine (ALLEGRA-D) 60-120 MG 12 hr tablet Take 1 tablet by mouth 2 (two) times daily. 09/02/16   Sable Feil, PA-C  HYDROcodone-acetaminophen (NORCO/VICODIN) 5-325 MG tablet Take 1 tablet by mouth every 4 (four) hours as needed for moderate pain. 07/25/15   Johnn Hai, PA-C  ibuprofen (ADVIL,MOTRIN) 200 MG tablet Take 800 mg by mouth daily as needed for headache (headache).     [provider]  levofloxacin (LEVAQUIN) 750 MG tablet Take 1 tablet (750 mg total) by mouth daily. 04/01/17 04/08/17  Kyan Yurkovich M, PA-C  meloxicam (MOBIC) 7.5 MG tablet Take 7.5 mg by mouth daily.    [provider]  metFORMIN (GLUCOPHAGE) 500 MG tablet Take 1,000 mg by mouth 2 (two) times daily with a meal.    [provider]  omeprazole (PRILOSEC) 20 MG capsule Take 20 mg by mouth daily.    [provider]  oxyCODONE-acetaminophen (PERCOCET/ROXICET) 5-325 MG per tablet Take 1 tablet by mouth every 4 (four) hours as needed for moderate pain or severe pain (headache).  [provider]  oxyCODONE-acetaminophen (PERCOCET/ROXICET) 5-325 MG per tablet Take 1-2 tablets by mouth every 6 (six) hours as needed for severe pain. 04/20/15   Gary Fleet, PA-C  predniSONE (STERAPRED UNI-PAK 21 TAB) 10 MG (21) TBPK tablet Take 6 tablets on day 1. Take 5 tablets on day 2. Take 4 tablets on day 3. Take 3 tablets on day 4. Take 2 tablets on day 5. Take 1 tablets on day 6.  04/01/17   Deren Degrazia M, PA-C  ranitidine (ZANTAC) 150 MG tablet Take 150 mg by mouth daily.    [provider]    Allergies Ibuprofen and Tramadol  Family History  Problem Relation Age of Onset  . Cancer Sister   . Diabetes Brother   . Hypertension Brother     Social History Social History  Substance Use Topics  . Smoking status: Current Every Day Smoker    Packs/day: 1.00    Years: 25.00    Types: Cigarettes  . Smokeless tobacco: Never Used  . Alcohol use 3.6 oz/week    6 Cans of beer per week     Comment: weekends only    Review of Systems Constitutional: Negative for fever/chills. Positive for fatigue Eyes: No visual changes. ENT:  Positive for sore throat, congestion.  Cardiovascular: Denies chest pain. Respiratory: Positive for cough. Positive for shortness of breath. Gastrointestinal: No abdominal pain.  No nausea, vomiting, diarrhea. Genitourinary: Negative for dysuria. Musculoskeletal: Negative for back pain. Skin: Negative for rash. Neurological: Negative for headaches. Able to ambulate. ____________________________________________   PHYSICAL EXAM:  VITAL SIGNS: Patient Vitals for the past 24 hrs:  BP Pulse Resp SpO2  04/01/17 1906 (!) 158/102 71 20 97 %   Patient reported to nursing he had not taken his PM BP meds and would be taking them once he arrived home.   Constitutional: Alert and oriented. Well appearing and in no acute distress.  Eyes: Conjunctivae are normal. PERRL. EOMI  Head: Normocephalic and atraumatic. ENT:      Ears: Canals clear. TMs intact bilaterally.      Nose: No congestion/rhinnorhea.      Mouth/Throat: Mucous membranes are moist. Oropharynx erythema. Tonsils Symmetrical bilaterally. Uvula midline. Cough and congestion. Neck:Supple. No thyromegaly. No stridor.  Cardiovascular: Normal rate, regular rhythm. Normal S1 and S2.  Good peripheral circulation. Respiratory: Normal respiratory effort without tachypnea or  retractions. Lungs CTAB. Good air entry to the bases with no decreased or absent breath sounds. Mild expiratory wheezes in the right lower lobe. Shortness of breath with coughing and mild exertion. Hematological/Lymphatic/Immunological: No cervical lymphadenopathy. Cardiovascular: Normal rate, regular rhythm. Normal distal pulses. Gastrointestinal: Bowel sounds 4 quadrants. Soft and nontender to palpation. No guarding or rigidity. No palpable masses. No distention. No CVA tenderness. Musculoskeletal: Nontender with normal range of motion in all extremities. Neurologic: Normal speech and language.  Skin:  Skin is warm, dry and intact. No rash noted. Psychiatric: Mood and affect are normal. Speech and behavior are normal. Patient exhibits appropriate insight and judgement.  ____________________________________________   LABS (all labs ordered are listed, but only abnormal results are displayed)  Labs Reviewed - No data to display ____________________________________________  EKG None ____________________________________________  RADIOLOGY DG chest 2 view FINDINGS: Stable normal cardiac silhouette. Mild bronchitic changes of the lung bases. No focal consolidation. No pleural effusion or pneumothorax. No acute osseous abnormality is evident.  IMPRESSION: Mild bronchitic changes in lung bases. No focal consolidation. ____________________________________________   PROCEDURES  Procedure(s) performed: no  Critical Care performed: no ____________________________________________   INITIAL IMPRESSION / ASSESSMENT AND PLAN / ED COURSE  Pertinent labs & imaging results that were available during my care of the patient were reviewed by me and considered in my medical decision making (see chart for details).   Patient presents to the emergency department sore throat, cough, congestion, shortness of breath and atigue for 1 week. History, physical exam and imaging are reassuring  symptoms are consistent with acute pharyngitis and bronchitis. Patient will be prescribed Levaquin for antibiotic coverage, prednisone taper throat lozenges, and albuterol inhaler to be used as needed. Patient will also be encourage to rest, and rehydrate as a part of supportive care. Physical exam is reassuring at this time. Patient reported he had not taken his blood pressure medicine and would be taking it as soon as he was discharged return home. Patient informed of clinical course, understand medical decision-making process, and agree with plan. Patient was advised to follow up with PCP and 2 weeks and was also advised to return to the emergency department for symptoms that change or worsen.  ____________________________________________   FINAL CLINICAL IMPRESSION(S) / ED DIAGNOSES  Final diagnoses:  Bronchitis  Acute pharyngitis, unspecified etiology       NEW MEDICATIONS STARTED DURING THIS VISIT:  Discharge Medication List as of 04/01/2017  6:29 PM    START taking these medications   Details  albuterol (PROVENTIL HFA;VENTOLIN HFA) 108 (90 Base) MCG/ACT inhaler Inhale 2 puffs into the lungs every 6 (six) hours as needed for wheezing or shortness of breath., Starting Wed 04/01/2017, Print    Dextromethorphan-Benzocaine 10-15 MG LOZG Use as directed 1 lozenge in the mouth or throat every 6 (six) hours as needed., Starting Wed 04/01/2017, Until Wed 04/08/2017, Print    levofloxacin (LEVAQUIN) 750 MG tablet Take 1 tablet (750 mg total) by mouth daily., Starting Wed 04/01/2017, Until Wed 04/08/2017, Print    predniSONE (STERAPRED UNI-PAK 21 TAB) 10 MG (21) TBPK tablet Take 6 tablets on day 1. Take 5 tablets on day 2. Take 4 tablets on day 3. Take 3 tablets on day 4. Take 2 tablets on day 5. Take 1 tablets on day 6., Print         Note:  This document was prepared using Dragon voice recognition software and may include unintentional dictation errors.    Olamae Ferrara, Fleet Contras 04/01/17  [id number]    Lisa Roca, MD 04/01/17 [phone]

## 2017-04-01 NOTE — ED Notes (Signed)

## 2017-04-02 LAB — POCT RAPID STREP A: STREPTOCOCCUS, GROUP A SCREEN (DIRECT): NEGATIVE

## 2017-08-07 ENCOUNTER — Ambulatory Visit: Payer: Managed Care, Other (non HMO) | Admitting: Podiatry

## 2017-08-12 ENCOUNTER — Encounter: Payer: Self-pay | Admitting: Podiatry

## 2017-08-12 ENCOUNTER — Ambulatory Visit: Payer: Managed Care, Other (non HMO) | Admitting: Podiatry

## 2017-08-12 DIAGNOSIS — Q828 Other specified congenital malformations of skin: Secondary | ICD-10-CM

## 2017-08-12 NOTE — Progress Notes (Signed)
Subjective:  Patient ID: Dan [name], male    DOB: 1966/02/23,  MRN: [id number] HPI Chief Complaint  Patient presents with  . Callouses    Patient presents today for painful callous on bottom of left midfoot x 2-3 months.  He states it feels like he is walking on a rock.  The only treatment he has done is scraped the dead skin off and scrubbed foot.    51 y.o. male presents with the above complaint.     Past Medical History:  Diagnosis Date  . Acid reflux   . Diabetes mellitus without complication ([place])   . Hypertension    Past Surgical History:  Procedure Laterality Date  . FRACTURE SURGERY Left at age 27   pelvic fracture  . pelvis fractur      Current Outpatient Medications:  .  losartan (COZAAR) 100 MG tablet, Take 100 mg by mouth daily., Disp: , Rfl:  .  acetaminophen (TYLENOL) 325 MG tablet, Take 650 mg by mouth every 6 (six) hours as needed (cold symptoms)., Disp: , Rfl:  .  albuterol (PROVENTIL HFA;VENTOLIN HFA) 108 (90 Base) MCG/ACT inhaler, Inhale 2 puffs into the lungs every 6 (six) hours as needed for wheezing or shortness of breath., Disp: 1 Inhaler, Rfl: 0 .  amLODipine (NORVASC) 10 MG tablet, Take 1 tablet (10 mg total) by mouth daily., Disp: 30 tablet, Rfl: 0 .  benzonatate (TESSALON) 100 MG capsule, Take 2 capsules (200 mg total) by mouth 3 (three) times daily as needed for cough., Disp: 30 capsule, Rfl: 0 .  ranitidine (ZANTAC) 150 MG tablet, Take 150 mg by mouth daily., Disp: , Rfl:   Allergies  Allergen Reactions  . Ibuprofen Nausea And Vomiting  . Tramadol Other (See Comments)    Headaches.    Review of Systems Objective:  There were no vitals filed for this visit.  General: Well developed, nourished, in no acute distress, alert and oriented x3   Dermatological: Skin is warm, dry and supple bilateral. Nails x 10 are well maintained; remaining integument appears unremarkable at this time. There are no open sores, no preulcerative lesions, no rash or  signs of infection present.  Porokeratosis plantar aspect of fifth metatarsal base left foot.  No open lesions or wounds no erythema cellulitis drainage or odor.  Pain on direct palpation of lesion and medial lateral compression.  No thrombosed capillaries visible.  Vascular: Dorsalis Pedis artery and Posterior Tibial artery pedal pulses are 2/4 bilateral with immedate capillary fill time. Pedal hair growth present. No varicosities and no lower extremity edema present bilateral.   Neruologic: Grossly intact via light touch bilateral. Vibratory intact via tuning fork bilateral. Protective threshold with Semmes Wienstein monofilament intact to all pedal sites bilateral. Patellar and Achilles deep tendon reflexes 2+ bilateral. No Babinski or clonus noted bilateral.   Musculoskeletal: No gross boney pedal deformities bilateral. No pain, crepitus, or limitation noted with foot and ankle range of motion bilateral. Muscular strength 5/5 in all groups tested bilateral.  Gait: Unassisted, Nonantalgic.    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  Plan: We discussed etiology pathology conservative versus surgical therapies.  At this point I do enucleated the lesion the best I could after sterile Betadine skin prep.  I then applied salicylic acid to be left under occlusion for 3 days without getting this wet.  He understands that in 3 days he will wash this off thoroughly  or if it starts to cause any type of burning or pain skin reaction he will wash it off before that.  I will follow-up with him in 3 weeks to try to do nucleate the rest of this lesion.     Max T. [place], [place]

## 2017-09-02 ENCOUNTER — Encounter (INDEPENDENT_AMBULATORY_CARE_PROVIDER_SITE_OTHER): Payer: Managed Care, Other (non HMO) | Admitting: Podiatry

## 2017-09-02 NOTE — Progress Notes (Signed)
This encounter was created in error - please disregard.

## 2018-07-09 ENCOUNTER — Ambulatory Visit: Payer: BLUE CROSS/BLUE SHIELD | Admitting: Podiatry

## 2018-07-09 ENCOUNTER — Encounter: Payer: Self-pay | Admitting: Podiatry

## 2018-07-09 DIAGNOSIS — B070 Plantar wart: Secondary | ICD-10-CM | POA: Diagnosis not present

## 2018-07-12 ENCOUNTER — Telehealth: Payer: Self-pay | Admitting: Podiatry

## 2018-07-12 ENCOUNTER — Other Ambulatory Visit: Payer: Self-pay | Admitting: Podiatry

## 2018-07-12 MED ORDER — CLOTRIMAZOLE-BETAMETHASONE 1-0.05 % EX CREA: 1.0000 "application " | TOPICAL_CREAM | Freq: Two times a day (BID) | CUTANEOUS | 0 refills | Status: DC

## 2018-07-12 MED ORDER — TERBINAFINE HCL 250 MG PO TABS: 250.0000 mg | ORAL_TABLET | Freq: Every day | ORAL | 0 refills | Status: DC

## 2018-07-12 NOTE — Progress Notes (Signed)
   Subjective: 52 year old male with PMHx of DM presenting today for with a chief complaint of a callus lesion noted to the plantar aspect of the left foot that has been ongoing for about a year. He states he would like it trimmed down. He has not had any recent treatment and states walking and weightbearing increase the pain. Patient is here for further evaluation and treatment.   Past Medical History:  Diagnosis Date  . Acid reflux   . Diabetes mellitus without complication ([place])   . Hypertension     Objective: Physical Exam General: The patient is alert and oriented x3 in no acute distress.  Dermatology: Hyperkeratotic skin lesion noted to the plantar aspect of the left foot approximately 1 cm in diameter. Pinpoint bleeding noted upon debridement. Skin is warm, dry and supple bilateral lower extremities. Negative for open lesions or macerations.  Vascular: Palpable pedal pulses bilaterally. No edema or erythema noted. Capillary refill within normal limits.  Neurological: Epicritic and protective threshold grossly intact bilaterally.   Musculoskeletal Exam: Pain on palpation to the note skin lesion.  Range of motion within normal limits to all pedal and ankle joints bilateral. Muscle strength 5/5 in all groups bilateral.   Assessment: #1 plantar wart left foot   Plan of Care:  #1 Patient was evaluated. #2 Excisional debridement of the plantar wart lesion was performed using a chisel blade. Cantharone was applied and the lesion was dressed with a dry sterile dressing. #3 patient is to return to clinic as needed.   Edrick Kins, DPM [facility]  Dr. Edrick Kins, [address]                                        [place], [place] [address]                Office [phone]  Fax [phone]

## 2018-07-12 NOTE — Telephone Encounter (Signed)
Pt called an stated that he was suppose to received a medication for a foot fungus and it was not sent to the pharmacy, Please call the patient

## 2018-07-13 MED ORDER — TERBINAFINE HCL 250 MG PO TABS: 250.0000 mg | ORAL_TABLET | Freq: Every day | ORAL | 0 refills | Status: DC

## 2018-07-13 MED ORDER — CLOTRIMAZOLE-BETAMETHASONE 1-0.05 % EX CREA: 1.0000 "application " | TOPICAL_CREAM | Freq: Two times a day (BID) | CUTANEOUS | 0 refills | Status: DC

## 2019-08-16 ENCOUNTER — Ambulatory Visit: Payer: BLUE CROSS/BLUE SHIELD | Attending: Internal Medicine

## 2019-08-16 DIAGNOSIS — Z20822 Contact with and (suspected) exposure to covid-19: Secondary | ICD-10-CM

## 2019-08-17 LAB — NOVEL CORONAVIRUS, NAA: SARS-CoV-2, NAA: NOT DETECTED

## 2019-09-08 ENCOUNTER — Encounter: Payer: Self-pay | Admitting: Family Medicine

## 2019-09-08 ENCOUNTER — Ambulatory Visit (INDEPENDENT_AMBULATORY_CARE_PROVIDER_SITE_OTHER): Payer: BLUE CROSS/BLUE SHIELD | Admitting: Family Medicine

## 2019-09-08 ENCOUNTER — Other Ambulatory Visit: Payer: Self-pay

## 2019-09-08 VITALS — BP 187/127 | HR 71 | Temp 98.6°F | Ht 71.5 in | Wt 213.0 lb

## 2019-09-08 DIAGNOSIS — J4520 Mild intermittent asthma, uncomplicated: Secondary | ICD-10-CM

## 2019-09-08 DIAGNOSIS — E119 Type 2 diabetes mellitus without complications: Secondary | ICD-10-CM | POA: Diagnosis not present

## 2019-09-08 DIAGNOSIS — I10 Essential (primary) hypertension: Secondary | ICD-10-CM | POA: Diagnosis not present

## 2019-09-08 DIAGNOSIS — J45909 Unspecified asthma, uncomplicated: Secondary | ICD-10-CM | POA: Insufficient documentation

## 2019-09-08 DIAGNOSIS — K219 Gastro-esophageal reflux disease without esophagitis: Secondary | ICD-10-CM

## 2019-09-08 MED ORDER — LOSARTAN POTASSIUM 100 MG PO TABS: 100.0000 mg | ORAL_TABLET | Freq: Every day | ORAL | 0 refills | Status: DC

## 2019-09-08 MED ORDER — OMEPRAZOLE 40 MG PO CPDR: 40.0000 mg | DELAYED_RELEASE_CAPSULE | Freq: Every day | ORAL | 3 refills | Status: DC

## 2019-09-08 MED ORDER — ALBUTEROL SULFATE HFA 108 (90 BASE) MCG/ACT IN AERS: 2.0000 | INHALATION_SPRAY | Freq: Four times a day (QID) | RESPIRATORY_TRACT | 5 refills | Status: DC | PRN

## 2019-09-08 MED ORDER — AMLODIPINE BESYLATE 10 MG PO TABS: 10.0000 mg | ORAL_TABLET | Freq: Every day | ORAL | 0 refills | Status: DC

## 2019-09-08 NOTE — Progress Notes (Signed)
BP (!) 187/127   Pulse 71   Temp 98.6 F (37 C) (Oral)   Ht 5' 11.5" (1.816 m)   Wt 213 lb (96.6 kg)   SpO2 98%   BMI 29.29 kg/m    Subjective:    Patient ID: Dan [name], male    DOB: 10-18-1965, 54 y.o.   MRN: [id number]  HPI: Dan [name] is a 54 y.o. male  Chief Complaint  Patient presents with  . Establish Care    pt would like to discuss BP medication  . Medication Refill    albuterol inhaler  . Gastroesophageal Reflux    prilosec RX requested   Patient presenting today to establish care.   Used to have diabetes and was on metformin, started eating a lot of cinnamon which seemed to take it away. Was told at last labs 6 months or so ago that he no longer had it or needed the medication. Trying to eat well and exercise.   GERD - on prilosec which seems to be working well. No breakthrough sxs as long as taking consistently. Denies melena, N/V.   BPs were running 140s-150s/90s when taking his medications but has been out for several months now. Tolerated them well without side effects. Denies CP, SOB, HAs, dizziness.   Hx of asthma, on albuterol inhaler prn. Uses typically about once every few days. Feels things are under good control.   Relevant past medical, surgical, family and social history reviewed and updated as indicated. Interim medical history since our last visit reviewed. Allergies and medications reviewed and updated.  Review of Systems  Per HPI unless specifically indicated above     Objective:    BP (!) 187/127   Pulse 71   Temp 98.6 F (37 C) (Oral)   Ht 5' 11.5" (1.816 m)   Wt 213 lb (96.6 kg)   SpO2 98%   BMI 29.29 kg/m   Wt Readings from Last 3 Encounters:  09/08/19 213 lb (96.6 kg)  09/02/16 200 lb (90.7 kg)  07/10/16 200 lb (90.7 kg)    Physical Exam Vitals and nursing note reviewed.  Constitutional:      Appearance: Normal appearance.  HENT:     Head: Atraumatic.  Eyes:     Extraocular Movements: Extraocular movements  intact.     Conjunctiva/sclera: Conjunctivae normal.  Cardiovascular:     Rate and Rhythm: Normal rate and regular rhythm.  Pulmonary:     Effort: Pulmonary effort is normal.     Breath sounds: Normal breath sounds.  Musculoskeletal:        General: Normal range of motion.     Cervical back: Normal range of motion and neck supple.  Skin:    General: Skin is warm and dry.  Neurological:     General: No focal deficit present.     Mental Status: He is oriented to person, place, and time.  Psychiatric:        Mood and Affect: Mood normal.        Thought Content: Thought content normal.        Judgment: Judgment normal.     Results for orders placed or performed in visit on 09/08/19  Comprehensive metabolic panel  Result Value Ref Range   Glucose 78 65 - 99 mg/dL   BUN 14 6 - 24 mg/dL   Creatinine, Ser 0.96 0.76 - 1.27 mg/dL   GFR calc non Af Amer 89 >59 mL/min/1.73   GFR calc Af Amer 102 >  59 mL/min/1.73   BUN/Creatinine Ratio 15 9 - 20   Sodium 142 134 - 144 mmol/L   Potassium 4.0 3.5 - 5.2 mmol/L   Chloride 105 96 - 106 mmol/L   CO2 25 20 - 29 mmol/L   Calcium 9.4 8.7 - 10.2 mg/dL   Total Protein 6.5 6.0 - 8.5 g/dL   Albumin 4.4 3.8 - 4.9 g/dL   Globulin, Total 2.1 1.5 - 4.5 g/dL   Albumin/Globulin Ratio 2.1 1.2 - 2.2   Bilirubin Total 0.4 0.0 - 1.2 mg/dL   Alkaline Phosphatase 83 39 - 117 IU/L   AST 31 0 - 40 IU/L   ALT 41 0 - 44 IU/L  Lipid Panel w/o Chol/HDL Ratio out  Result Value Ref Range   Cholesterol, Total 147 100 - 199 mg/dL   Triglycerides 97 0 - 149 mg/dL   HDL 64 >39 mg/dL   VLDL Cholesterol Cal 18 5 - 40 mg/dL   LDL Chol Calc (NIH) 65 0 - 99 mg/dL  HgB A1c  Result Value Ref Range   Hgb A1c MFr Bld 5.5 4.8 - 5.6 %   Est. average glucose Bld gHb Est-mCnc 111 mg/dL      Assessment & Plan:   Problem List Items Addressed This Visit      Cardiovascular and Mediastinum   HTN (hypertension)    Restart medications, recheck at upcoming follow up.  Exercise, DASH diet reviewed      Relevant Medications   amLODipine (NORVASC) 10 MG tablet   losartan (COZAAR) 100 MG tablet   Other Relevant Orders   Comprehensive metabolic panel (Completed)     Respiratory   Asthma    Stable and under good control, continue current regimen      Relevant Medications   albuterol (VENTOLIN HFA) 108 (90 Base) MCG/ACT inhaler     Digestive   Acid reflux    Stable on prilosec, continue current regimen      Relevant Medications   omeprazole (PRILOSEC) 40 MG capsule     Endocrine   Type 2 diabetes mellitus without complication ([place]) - Primary    Due for labs, will recheck labs and adjust as needed. Continue good lifestyle habits for control      Relevant Medications   losartan (COZAAR) 100 MG tablet   Other Relevant Orders   Comprehensive metabolic panel (Completed)   Lipid Panel w/o Chol/HDL Ratio out (Completed)   HgB A1c (Completed)       Follow up plan: Return in about 4 weeks (around 10/06/2019) for HTN.

## 2019-09-09 LAB — COMPREHENSIVE METABOLIC PANEL
ALT: 41 IU/L (ref 0–44)
AST: 31 IU/L (ref 0–40)
Albumin/Globulin Ratio: 2.1 (ref 1.2–2.2)
Albumin: 4.4 g/dL (ref 3.8–4.9)
Alkaline Phosphatase: 83 IU/L (ref 39–117)
BUN/Creatinine Ratio: 15 (ref 9–20)
BUN: 14 mg/dL (ref 6–24)
Bilirubin Total: 0.4 mg/dL (ref 0.0–1.2)
CO2: 25 mmol/L (ref 20–29)
Calcium: 9.4 mg/dL (ref 8.7–10.2)
Chloride: 105 mmol/L (ref 96–106)
Creatinine, Ser: 0.96 mg/dL (ref 0.76–1.27)
GFR calc Af Amer: 102 mL/min/{1.73_m2} (ref >59)
GFR calc non Af Amer: 89 mL/min/{1.73_m2} (ref >59)
Globulin, Total: 2.1 g/dL (ref 1.5–4.5)
Glucose: 78 mg/dL (ref 65–99)
Potassium: 4 mmol/L (ref 3.5–5.2)
Sodium: 142 mmol/L (ref 134–144)
Total Protein: 6.5 g/dL (ref 6.0–8.5)

## 2019-09-09 LAB — LIPID PANEL W/O CHOL/HDL RATIO
Cholesterol, Total: 147 mg/dL (ref 100–199)
HDL: 64 mg/dL (ref >39)
LDL Chol Calc (NIH): 65 mg/dL (ref 0–99)
Triglycerides: 97 mg/dL (ref 0–149)
VLDL Cholesterol Cal: 18 mg/dL (ref 5–40)

## 2019-09-09 LAB — HEMOGLOBIN A1C
Est. average glucose Bld gHb Est-mCnc: 111 mg/dL
Hgb A1c MFr Bld: 5.5 % (ref 4.8–5.6)

## 2019-09-14 NOTE — Assessment & Plan Note (Signed)
Due for labs, will recheck labs and adjust as needed. Continue good lifestyle habits for control

## 2019-09-14 NOTE — Assessment & Plan Note (Signed)
Restart medications, recheck at upcoming follow up. Exercise, DASH diet reviewed

## 2019-09-14 NOTE — Assessment & Plan Note (Signed)
Stable and under good control, continue current regimen 

## 2019-09-14 NOTE — Assessment & Plan Note (Signed)
Stable on prilosec, continue current regimen 

## 2019-10-13 ENCOUNTER — Telehealth: Payer: BLUE CROSS/BLUE SHIELD | Admitting: Family Medicine

## 2019-10-13 ENCOUNTER — Ambulatory Visit: Payer: BLUE CROSS/BLUE SHIELD | Admitting: Family Medicine

## 2019-10-20 ENCOUNTER — Telehealth (INDEPENDENT_AMBULATORY_CARE_PROVIDER_SITE_OTHER): Payer: BLUE CROSS/BLUE SHIELD | Admitting: Family Medicine

## 2019-10-20 ENCOUNTER — Encounter: Payer: Self-pay | Admitting: Family Medicine

## 2019-10-20 VITALS — BP 143/106 | HR 102 | Ht 72.0 in | Wt 200.0 lb

## 2019-10-20 DIAGNOSIS — I10 Essential (primary) hypertension: Secondary | ICD-10-CM | POA: Diagnosis not present

## 2019-10-20 DIAGNOSIS — Z1211 Encounter for screening for malignant neoplasm of colon: Secondary | ICD-10-CM

## 2019-10-20 MED ORDER — LOSARTAN POTASSIUM 100 MG PO TABS: 100.0000 mg | ORAL_TABLET | Freq: Every day | ORAL | 0 refills | Status: DC

## 2019-10-20 MED ORDER — AMLODIPINE BESYLATE 10 MG PO TABS: 10.0000 mg | ORAL_TABLET | Freq: Every day | ORAL | 0 refills | Status: DC

## 2019-10-20 MED ORDER — HYDROCHLOROTHIAZIDE 12.5 MG PO TABS: 12.5000 mg | ORAL_TABLET | Freq: Every day | ORAL | 0 refills | Status: DC

## 2019-10-20 NOTE — Assessment & Plan Note (Signed)
BPs above goal, add HCTZ and work on [employer], exercise. F/u in 1 month for recheck, continue home monitoring

## 2019-10-20 NOTE — Progress Notes (Signed)
BP (!) 143/106   Pulse (!) 102   Ht 6' (1.829 m)   Wt 200 lb (90.7 kg)   BMI 27.12 kg/m    Subjective:    Patient ID: Dan [name], male    DOB: May 16, 1966, 54 y.o.   MRN: [id number]  HPI: Dan [name] is a 54 y.o. male  Chief Complaint  Patient presents with  . Hypertension  . Medication Refill    last dose was yesterday    . This visit was completed via telephone due to the restrictions of the COVID-19 pandemic. All issues as above were discussed and addressed. Physical exam was done as above through visual confirmation on telephone. If it was felt that the patient should be evaluated in the office, they were directed there. The patient verbally consented to this visit. . Location of the patient: home . Location of the provider: work . Those involved with this call:  . Provider: Merrie Roof, PA-C . CMA: Lesle Chris, Jud . Front Desk/Registration: Jill Side  . Time spent on call: 15 minutes on the phone discussing health concerns. 5 minutes total spent in review of patient's record and preparation of their chart. I verified patient identity using two factors (patient name and date of birth). Patient consents verbally to being seen via telemedicine visit today.   Patient presenting today for HTN f/u after restarting regimen of losartan and amlodipine. Tolerating medications well without side effects. Denies CP, SOB, HAs, dizziness. Home BPs running 140s/100s range. Has not been following close diet or exercising.   Relevant past medical, surgical, family and social history reviewed and updated as indicated. Interim medical history since our last visit reviewed. Allergies and medications reviewed and updated.  Review of Systems  Per HPI unless specifically indicated above     Objective:    BP (!) 143/106   Pulse (!) 102   Ht 6' (1.829 m)   Wt 200 lb (90.7 kg)   BMI 27.12 kg/m   Wt Readings from Last 3 Encounters:  10/20/19 200 lb (90.7 kg)  09/08/19 213 lb  (96.6 kg)  09/02/16 200 lb (90.7 kg)    Physical Exam  Unable to perform PE due to patient lack of access to video technology for today's visit.   Results for orders placed or performed in visit on 09/08/19  Comprehensive metabolic panel  Result Value Ref Range   Glucose 78 65 - 99 mg/dL   BUN 14 6 - 24 mg/dL   Creatinine, Ser 0.96 0.76 - 1.27 mg/dL   GFR calc non Af Amer 89 >59 mL/min/1.73   GFR calc Af Amer 102 >59 mL/min/1.73   BUN/Creatinine Ratio 15 9 - 20   Sodium 142 134 - 144 mmol/L   Potassium 4.0 3.5 - 5.2 mmol/L   Chloride 105 96 - 106 mmol/L   CO2 25 20 - 29 mmol/L   Calcium 9.4 8.7 - 10.2 mg/dL   Total Protein 6.5 6.0 - 8.5 g/dL   Albumin 4.4 3.8 - 4.9 g/dL   Globulin, Total 2.1 1.5 - 4.5 g/dL   Albumin/Globulin Ratio 2.1 1.2 - 2.2   Bilirubin Total 0.4 0.0 - 1.2 mg/dL   Alkaline Phosphatase 83 39 - 117 IU/L   AST 31 0 - 40 IU/L   ALT 41 0 - 44 IU/L  Lipid Panel w/o Chol/HDL Ratio out  Result Value Ref Range   Cholesterol, Total 147 100 - 199 mg/dL   Triglycerides 97 0 - 149 mg/dL  HDL 64 >39 mg/dL   VLDL Cholesterol Cal 18 5 - 40 mg/dL   LDL Chol Calc (NIH) 65 0 - 99 mg/dL  HgB A1c  Result Value Ref Range   Hgb A1c MFr Bld 5.5 4.8 - 5.6 %   Est. average glucose Bld gHb Est-mCnc 111 mg/dL      Assessment & Plan:   Problem List Items Addressed This Visit      Cardiovascular and Mediastinum   HTN (hypertension) - Primary    BPs above goal, add HCTZ and work on [employer], exercise. F/u in 1 month for recheck, continue home monitoring      Relevant Medications   hydrochlorothiazide (HYDRODIURIL) 12.5 MG tablet   losartan (COZAAR) 100 MG tablet   amLODipine (NORVASC) 10 MG tablet    Other Visit Diagnoses    Colon cancer screening       Relevant Orders   Ambulatory referral to Gastroenterology       Follow up plan: Return in about 4 weeks (around 11/17/2019) for BP.

## 2019-11-10 ENCOUNTER — Telehealth: Payer: Self-pay

## 2019-11-10 ENCOUNTER — Other Ambulatory Visit: Payer: Self-pay

## 2019-11-10 DIAGNOSIS — Z1211 Encounter for screening for malignant neoplasm of colon: Secondary | ICD-10-CM

## 2019-11-10 NOTE — Telephone Encounter (Signed)
Gastroenterology Pre-Procedure Review  Request Date: Friday 12/02/19 Requesting Physician: Dr. Vicente Males  PATIENT REVIEW QUESTIONS: The patient responded to the following health history questions as indicated:    1. Are you having any GI issues? no 2. Do you have a personal history of Polyps? no 3. Do you have a family history of Colon Cancer or Polyps? no 4. Diabetes Mellitus? no 5. Joint replacements in the past 12 months?no 6. Major health problems in the past 3 months?no 7. Any artificial heart valves, MVP, or defibrillator?no    MEDICATIONS & ALLERGIES:    Patient reports the following regarding taking any anticoagulation/antiplatelet therapy:   Plavix, Coumadin, Eliquis, Xarelto, Lovenox, Pradaxa, Brilinta, or Effient? no Aspirin? No  Patient confirms/reports the following medications:  Current Outpatient Medications  Medication Sig Dispense Refill  . acetaminophen (TYLENOL) 325 MG tablet Take 650 mg by mouth every 6 (six) hours as needed (cold symptoms).    Marland Kitchen albuterol (VENTOLIN HFA) 108 (90 Base) MCG/ACT inhaler Inhale 2 puffs into the lungs every 6 (six) hours as needed for wheezing or shortness of breath. 18 g 5  . amLODipine (NORVASC) 10 MG tablet Take 1 tablet (10 mg total) by mouth daily. 30 tablet 0  . hydrochlorothiazide (HYDRODIURIL) 12.5 MG tablet Take 1 tablet (12.5 mg total) by mouth daily. 30 tablet 0  . losartan (COZAAR) 100 MG tablet Take 1 tablet (100 mg total) by mouth daily. 30 tablet 0  . omeprazole (PRILOSEC) 40 MG capsule Take 1 capsule (40 mg total) by mouth daily. 30 capsule 3   No current facility-administered medications for this visit.    Patient confirms/reports the following allergies:  Allergies  Allergen Reactions  . Ibuprofen Nausea And Vomiting  . Tramadol Other (See Comments)    Headaches.     No orders of the defined types were placed in this encounter.   AUTHORIZATION INFORMATION Primary Insurance: 1D#: Group #:  Secondary  Insurance: 1D#: Group #:  SCHEDULE INFORMATION: Date: Friday 12/02/19 Time: Location:[facility]

## 2019-11-14 ENCOUNTER — Encounter: Payer: Self-pay | Admitting: Nurse Practitioner

## 2019-11-14 ENCOUNTER — Ambulatory Visit (INDEPENDENT_AMBULATORY_CARE_PROVIDER_SITE_OTHER): Payer: BLUE CROSS/BLUE SHIELD | Admitting: Nurse Practitioner

## 2019-11-14 ENCOUNTER — Other Ambulatory Visit: Payer: Self-pay

## 2019-11-14 VITALS — BP 143/99 | HR 91 | Temp 98.8°F | Ht 72.0 in | Wt 212.2 lb

## 2019-11-14 DIAGNOSIS — R110 Nausea: Secondary | ICD-10-CM

## 2019-11-14 DIAGNOSIS — R109 Unspecified abdominal pain: Secondary | ICD-10-CM

## 2019-11-14 LAB — UA/M W/RFLX CULTURE, ROUTINE
Bilirubin, UA: NEGATIVE
Glucose, UA: NEGATIVE
Ketones, UA: NEGATIVE
Leukocytes,UA: NEGATIVE
Nitrite, UA: NEGATIVE
Protein,UA: NEGATIVE
RBC, UA: NEGATIVE
Specific Gravity, UA: 1.025 (ref 1.005–1.030)
Urobilinogen, Ur: 0.2 mg/dL (ref 0.2–1.0)
pH, UA: 6.5 (ref 5.0–7.5)

## 2019-11-14 MED ORDER — ETODOLAC 200 MG PO CAPS: 200.0000 mg | ORAL_CAPSULE | Freq: Four times a day (QID) | ORAL | 0 refills | Status: DC | PRN

## 2019-11-14 MED ORDER — ONDANSETRON 4 MG PO TBDP: 4.0000 mg | ORAL_TABLET | Freq: Three times a day (TID) | ORAL | 0 refills | Status: DC | PRN

## 2019-11-14 MED ORDER — KETOROLAC TROMETHAMINE 60 MG/2ML IM SOLN
60.0000 mg | Freq: Once | INTRAMUSCULAR | Status: AC
  Administered 2019-11-14: 60 mg via INTRAMUSCULAR

## 2019-11-14 MED ORDER — TAMSULOSIN HCL 0.4 MG PO CAPS: 0.4000 mg | ORAL_CAPSULE | Freq: Every day | ORAL | 0 refills | Status: DC

## 2019-11-14 NOTE — Progress Notes (Signed)
BP (!) 143/99 (BP Location: Left Arm, Patient Position: Sitting, Cuff Size: Normal)   Pulse 91   Temp 98.8 F (37.1 C) (Oral)   Ht 6' (1.829 m)   Wt 212 lb 3.2 oz (96.3 kg)   SpO2 98%   BMI 28.78 kg/m    Subjective:    Patient ID: Dan [name], male    DOB: 15-Jun-1966, 54 y.o.   MRN: [id number]  HPI: Dan [name] is a 54 y.o. male presenting for right sided flank pain.  Chief Complaint  Patient presents with  . Flank Pain    Right side. Ongoing 1 week. Pain scale can be a 10 for patient. Hx of Kidney Stones   BACK PAIN Patient reports he had a kidney stone about 15 years ago and the pain is similar as it was then.   He said they put him in a machine and "broke it down" then he passed it on his own. Duration: weeks - 1 Mechanism of injury: unknown Location: Right and midline Onset: sudden Severity: 10/10 Quality: sharp Frequency: a few times a day Radiation: R leg above the knee Aggravating factors: walking Alleviating factors: Tylenol, Advil, aleve and heat Status: worse Treatments attempted: extra water, heat, APAP and aleve  Relief with NSAIDs?: mild Nighttime pain:  no Paresthesias / decreased sensation:  no Bowel / bladder incontinence:  no Fevers:  no  Nausea: yes Vomiting: no Appetite: good, normal Dysuria / urinary frequency:  no  Allergies  Allergen Reactions  . Ibuprofen Nausea And Vomiting  . Tramadol Other (See Comments)    Headaches.    Outpatient Encounter Medications as of 11/14/2019  Medication Sig  . acetaminophen (TYLENOL) 325 MG tablet Take 650 mg by mouth every 6 (six) hours as needed (cold symptoms).  Marland Kitchen albuterol (VENTOLIN HFA) 108 (90 Base) MCG/ACT inhaler Inhale 2 puffs into the lungs every 6 (six) hours as needed for wheezing or shortness of breath.  Marland Kitchen amLODipine (NORVASC) 10 MG tablet Take 1 tablet (10 mg total) by mouth daily.  . hydrochlorothiazide (HYDRODIURIL) 12.5 MG tablet Take 1 tablet (12.5 mg total) by mouth daily.  Marland Kitchen  losartan (COZAAR) 100 MG tablet Take 1 tablet (100 mg total) by mouth daily.  Marland Kitchen omeprazole (PRILOSEC) 40 MG capsule Take 1 capsule (40 mg total) by mouth daily.  Marland Kitchen etodolac (LODINE) 200 MG capsule Take 1 capsule (200 mg total) by mouth every 6 (six) hours as needed for moderate pain.  Marland Kitchen ondansetron (ZOFRAN ODT) 4 MG disintegrating tablet Take 1 tablet (4 mg total) by mouth every 8 (eight) hours as needed for nausea or vomiting.  . tamsulosin (FLOMAX) 0.4 MG CAPS capsule Take 1 capsule (0.4 mg total) by mouth at bedtime.   Facility-Administered Encounter Medications as of 11/14/2019  Medication  . ketorolac (TORADOL) injection 60 mg   Patient Active Problem List   Diagnosis Date Noted  . Flank pain 11/14/2019  . Asthma   . Acid reflux   . Type 2 diabetes mellitus without complication ([place]) [id number]  . HTN (hypertension) 08/17/2013   Past Medical History:  Diagnosis Date  . Acid reflux   . Asthma   . Diabetes mellitus without complication ([place])   . Hypertension   . Kidney stones    Relevant past medical, surgical, family and social history reviewed and updated as indicated. Interim medical history since our last visit reviewed.  Review of Systems  Constitutional: Negative.  Negative for activity change, appetite change, chills, fatigue and  fever.  Gastrointestinal: Positive for nausea. Negative for constipation, diarrhea and vomiting.  Genitourinary: Positive for flank pain. Negative for decreased urine volume, dysuria, frequency, hematuria and urgency.  Musculoskeletal: Positive for back pain (R sided). Negative for arthralgias, gait problem, joint swelling and myalgias.  Neurological: Negative.  Negative for dizziness, light-headedness and headaches.  Psychiatric/Behavioral: Negative.  Negative for agitation and sleep disturbance. The patient is not nervous/anxious.    Per HPI unless specifically indicated above    Objective:    BP (!) 143/99 (BP Location: Left Arm, Patient  Position: Sitting, Cuff Size: Normal)   Pulse 91   Temp 98.8 F (37.1 C) (Oral)   Ht 6' (1.829 m)   Wt 212 lb 3.2 oz (96.3 kg)   SpO2 98%   BMI 28.78 kg/m   Wt Readings from Last 3 Encounters:  11/14/19 212 lb 3.2 oz (96.3 kg)  10/20/19 200 lb (90.7 kg)  09/08/19 213 lb (96.6 kg)    Physical Exam Vitals and nursing note reviewed.  Constitutional:      General: He is not in acute distress.    Appearance: Normal appearance. He is normal weight.  Abdominal:     General: Abdomen is flat. Bowel sounds are normal. There is no distension.     Palpations: Abdomen is soft.     Tenderness: There is no abdominal tenderness. There is right CVA tenderness. There is no left CVA tenderness.  Musculoskeletal:        General: No swelling or tenderness. Normal range of motion.     Right lower leg: No edema.     Left lower leg: No edema.  Neurological:     General: No focal deficit present.     Mental Status: He is alert and oriented to person, place, and time.     Motor: No weakness.     Gait: Gait normal.  Psychiatric:        Mood and Affect: Mood normal.        Behavior: Behavior normal.        Thought Content: Thought content normal.        Judgment: Judgment normal.     Results for orders placed or performed in visit on 09/08/19  Comprehensive metabolic panel  Result Value Ref Range   Glucose 78 65 - 99 mg/dL   BUN 14 6 - 24 mg/dL   Creatinine, Ser 0.96 0.76 - 1.27 mg/dL   GFR calc non Af Amer 89 >59 mL/min/1.73   GFR calc Af Amer 102 >59 mL/min/1.73   BUN/Creatinine Ratio 15 9 - 20   Sodium 142 134 - 144 mmol/L   Potassium 4.0 3.5 - 5.2 mmol/L   Chloride 105 96 - 106 mmol/L   CO2 25 20 - 29 mmol/L   Calcium 9.4 8.7 - 10.2 mg/dL   Total Protein 6.5 6.0 - 8.5 g/dL   Albumin 4.4 3.8 - 4.9 g/dL   Globulin, Total 2.1 1.5 - 4.5 g/dL   Albumin/Globulin Ratio 2.1 1.2 - 2.2   Bilirubin Total 0.4 0.0 - 1.2 mg/dL   Alkaline Phosphatase 83 39 - 117 IU/L   AST 31 0 - 40 IU/L   ALT  41 0 - 44 IU/L  Lipid Panel w/o Chol/HDL Ratio out  Result Value Ref Range   Cholesterol, Total 147 100 - 199 mg/dL   Triglycerides 97 0 - 149 mg/dL   HDL 64 >39 mg/dL   VLDL Cholesterol Cal 18 5 - 40 mg/dL   LDL  Chol Calc (NIH) 65 0 - 99 mg/dL  HgB A1c  Result Value Ref Range   Hgb A1c MFr Bld 5.5 4.8 - 5.6 %   Est. average glucose Bld gHb Est-mCnc 111 mg/dL      Assessment & Plan:   Problem List Items Addressed This Visit      Other   Flank pain - Primary    Acute, ongoing.  No reciprocation of pain on examination, and +CVA tenderness on right side, likely kidney stone, however UA and microscopic exam in office negative.  Continue to hydrate, IM toradol given in office and start flomax at night to prevent dizziness.  Rx given for etodolac q6 hours prn pain and zofran q8 hours for nausea.  Advised to strain urine.  Return to office if symptoms no better in 2 weeks.  With fever and/or severe nausea/vomiting and unable to keep fluids down, go to ER.      Relevant Medications   tamsulosin (FLOMAX) 0.4 MG CAPS capsule   ketorolac (TORADOL) injection 60 mg (Start on 11/14/2019  4:30 PM)   etodolac (LODINE) 200 MG capsule   Other Relevant Orders   UA/M w/rflx Culture, Routine    Other Visit Diagnoses    Nausea       Relevant Medications   ondansetron (ZOFRAN ODT) 4 MG disintegrating tablet       Follow up plan: Return if symptoms worsen or fail to improve.

## 2019-11-14 NOTE — Patient Instructions (Signed)
Flank Pain, Adult Flank pain is pain that is located on the side of the body between the upper abdomen and the back. This area is called the flank. The pain may occur over a short period of time (acute), or it may be long-term or recurring (chronic). It may be mild or severe. Flank pain can be caused by many things, including:  Muscle soreness or injury.  Kidney stones or kidney disease.  Stress.  A disease of the spine (vertebral disk disease).  A lung infection (pneumonia).  Fluid around the lungs (pulmonary edema).  A skin rash caused by the chickenpox virus (shingles).  Tumors that affect the back of the abdomen.  Gallbladder disease. Follow these instructions at home:   Drink enough fluid to keep your urine clear or pale yellow.  Rest as told by your health care provider.  Take over-the-counter and prescription medicines only as told by your health care provider.  Keep a journal to track what has caused your flank pain and what has made it feel better.  Keep all follow-up visits as told by your health care provider. This is important. Contact a health care provider if:  Your pain is not controlled with medicine.  You have new symptoms.  Your pain gets worse.  You have a fever.  Your symptoms last longer than 2-3 days.  You have trouble urinating or you are urinating very frequently. Get help right away if:  You have trouble breathing or you are short of breath.  Your abdomen hurts or it is swollen or red.  You have nausea or vomiting.  You feel faint or you pass out.  You have blood in your urine. Summary  Flank pain is pain that is located on the side of the body between the upper abdomen and the back.  The pain may occur over a short period of time (acute), or it may be long-term or recurring (chronic). It may be mild or severe.  Flank pain can be caused by many things.  Contact your health care provider if your symptoms get worse or they last  longer than 2-3 days. This information is not intended to replace advice given to you by your health care provider. Make sure you discuss any questions you have with your health care provider. Document Revised: 07/24/2017 Document Reviewed: 10/24/2016 Elsevier Patient Education  2020 Elsevier Inc.  

## 2019-11-14 NOTE — Assessment & Plan Note (Signed)
Acute, ongoing.  No reciprocation of pain on examination, and +CVA tenderness on right side, likely kidney stone, however UA and microscopic exam in office negative.  Continue to hydrate, IM toradol given in office and start flomax at night to prevent dizziness.  Rx given for etodolac q6 hours prn pain and zofran q8 hours for nausea.  Advised to strain urine.  Return to office if symptoms no better in 2 weeks.  With fever and/or severe nausea/vomiting and unable to keep fluids down, go to ER.

## 2019-11-15 ENCOUNTER — Other Ambulatory Visit: Payer: Self-pay | Admitting: Family Medicine

## 2019-11-15 NOTE — Telephone Encounter (Signed)
Requested Prescriptions  Pending Prescriptions Disp Refills  . amLODipine (NORVASC) 10 MG tablet [Pharmacy Med Name: AMLODIPINE BESYLATE 10MG  TABLETS] 90 tablet 1    Sig: TAKE 1 TABLET(10 MG) BY MOUTH DAILY     Cardiovascular:  Calcium Channel Blockers Failed - 11/15/2019  6:26 AM      Failed - Last BP in normal range    BP Readings from Last 1 Encounters:  11/14/19 (!) 143/99         Passed - Valid encounter within last 6 months    Recent Outpatient Visits          Yesterday Flank pain   [facility] Carnella Guadalajara I, NP   3 weeks ago Essential hypertension   [facility] Volney American, [place]   2 months ago Type 2 diabetes mellitus without complication, without long-term current use of insulin ([place])   [place], Lilia Argue, [place]      Future Appointments            In 1 week Orene Desanctis, Lilia Argue, PA-C [place], [facility]           . losartan (COZAAR) 100 MG tablet [Pharmacy Med Name: LOSARTAN 100MG  TABLETS] 30 tablet 0    Sig: TAKE 1 TABLET(100 MG) BY MOUTH DAILY     Cardiovascular:  Angiotensin Receptor Blockers Failed - 11/15/2019  6:26 AM      Failed - Last BP in normal range    BP Readings from Last 1 Encounters:  11/14/19 (!) 143/99         Passed - Cr in normal range and within 180 days    Creatinine, Ser  Date Value Ref Range Status  09/08/2019 0.96 0.76 - 1.27 mg/dL Final         Passed - K in normal range and within 180 days    Potassium  Date Value Ref Range Status  09/08/2019 4.0 3.5 - 5.2 mmol/L Final         Passed - Patient is not pregnant      Passed - Valid encounter within last 6 months    Recent Outpatient Visits          Yesterday Flank pain   [place], Jessica I, NP   3 weeks ago Essential hypertension   [facility] Volney American, [place]   2 months ago Type 2 diabetes mellitus without complication, without long-term current use  of insulin [facility])   [place], Lilia Argue, [place]      Future Appointments            In 1 week Orene Desanctis, Lilia Argue, PA-C [facility], [place]

## 2019-11-17 ENCOUNTER — Other Ambulatory Visit: Payer: Self-pay | Admitting: Family Medicine

## 2019-11-24 ENCOUNTER — Ambulatory Visit: Payer: BLUE CROSS/BLUE SHIELD | Admitting: Family Medicine

## 2019-11-30 ENCOUNTER — Other Ambulatory Visit
Admission: RE | Admit: 2019-11-30 | Discharge: 2019-11-30 | Disposition: A | Discharge status: Home / Self Care | Payer: BLUE CROSS/BLUE SHIELD | Source: Ambulatory Visit | Attending: Gastroenterology | Admitting: Gastroenterology

## 2019-11-30 ENCOUNTER — Other Ambulatory Visit: Payer: Self-pay

## 2019-11-30 DIAGNOSIS — Z01812 Encounter for preprocedural laboratory examination: Secondary | ICD-10-CM | POA: Diagnosis not present

## 2019-11-30 DIAGNOSIS — Z20822 Contact with and (suspected) exposure to covid-19: Secondary | ICD-10-CM | POA: Diagnosis not present

## 2019-11-30 LAB — SARS CORONAVIRUS 2 (TAT 6-24 HRS): SARS Coronavirus 2: NEGATIVE

## 2019-12-02 ENCOUNTER — Ambulatory Visit: Payer: BLUE CROSS/BLUE SHIELD | Admitting: Certified Registered Nurse Anesthetist

## 2019-12-02 ENCOUNTER — Ambulatory Visit
Admission: RE | Admit: 2019-12-02 | Discharge: 2019-12-02 | Disposition: A | Discharge status: Home / Self Care | Payer: BLUE CROSS/BLUE SHIELD | Attending: Gastroenterology | Admitting: Gastroenterology

## 2019-12-02 ENCOUNTER — Encounter: Payer: Self-pay | Admitting: Gastroenterology

## 2019-12-02 ENCOUNTER — Encounter
Admission: RE | Disposition: A | Discharge status: Home / Self Care | Payer: Self-pay | Source: Home / Self Care | Attending: Gastroenterology

## 2019-12-02 ENCOUNTER — Other Ambulatory Visit: Payer: Self-pay

## 2019-12-02 DIAGNOSIS — K5730 Diverticulosis of large intestine without perforation or abscess without bleeding: Secondary | ICD-10-CM | POA: Diagnosis not present

## 2019-12-02 DIAGNOSIS — K621 Rectal polyp: Secondary | ICD-10-CM | POA: Diagnosis not present

## 2019-12-02 DIAGNOSIS — Z791 Long term (current) use of non-steroidal anti-inflammatories (NSAID): Secondary | ICD-10-CM | POA: Diagnosis not present

## 2019-12-02 DIAGNOSIS — Z833 Family history of diabetes mellitus: Secondary | ICD-10-CM | POA: Insufficient documentation

## 2019-12-02 DIAGNOSIS — D124 Benign neoplasm of descending colon: Secondary | ICD-10-CM | POA: Diagnosis not present

## 2019-12-02 DIAGNOSIS — Z79899 Other long term (current) drug therapy: Secondary | ICD-10-CM | POA: Diagnosis not present

## 2019-12-02 DIAGNOSIS — E119 Type 2 diabetes mellitus without complications: Secondary | ICD-10-CM | POA: Insufficient documentation

## 2019-12-02 DIAGNOSIS — F17210 Nicotine dependence, cigarettes, uncomplicated: Secondary | ICD-10-CM | POA: Diagnosis not present

## 2019-12-02 DIAGNOSIS — Z825 Family history of asthma and other chronic lower respiratory diseases: Secondary | ICD-10-CM | POA: Insufficient documentation

## 2019-12-02 DIAGNOSIS — Z87442 Personal history of urinary calculi: Secondary | ICD-10-CM | POA: Insufficient documentation

## 2019-12-02 DIAGNOSIS — Z8249 Family history of ischemic heart disease and other diseases of the circulatory system: Secondary | ICD-10-CM | POA: Insufficient documentation

## 2019-12-02 DIAGNOSIS — D125 Benign neoplasm of sigmoid colon: Secondary | ICD-10-CM | POA: Insufficient documentation

## 2019-12-02 DIAGNOSIS — Z1211 Encounter for screening for malignant neoplasm of colon: Secondary | ICD-10-CM | POA: Diagnosis not present

## 2019-12-02 DIAGNOSIS — K640 First degree hemorrhoids: Secondary | ICD-10-CM | POA: Diagnosis not present

## 2019-12-02 DIAGNOSIS — I10 Essential (primary) hypertension: Secondary | ICD-10-CM | POA: Diagnosis not present

## 2019-12-02 DIAGNOSIS — J45909 Unspecified asthma, uncomplicated: Secondary | ICD-10-CM | POA: Diagnosis not present

## 2019-12-02 DIAGNOSIS — K219 Gastro-esophageal reflux disease without esophagitis: Secondary | ICD-10-CM | POA: Insufficient documentation

## 2019-12-02 DIAGNOSIS — K625 Hemorrhage of anus and rectum: Secondary | ICD-10-CM

## 2019-12-02 DIAGNOSIS — Z809 Family history of malignant neoplasm, unspecified: Secondary | ICD-10-CM | POA: Insufficient documentation

## 2019-12-02 HISTORY — PX: COLONOSCOPY WITH PROPOFOL: SHX5780

## 2019-12-02 SURGERY — COLONOSCOPY WITH PROPOFOL
Anesthesia: General

## 2019-12-02 MED ORDER — LIDOCAINE HCL (CARDIAC) PF 100 MG/5ML IV SOSY
PREFILLED_SYRINGE | INTRAVENOUS | Status: DC | PRN
  Administered 2019-12-02: 60 mg via INTRAVENOUS

## 2019-12-02 MED ORDER — PROPOFOL 10 MG/ML IV BOLUS
INTRAVENOUS | Status: AC
  Filled 2019-12-02: qty 20

## 2019-12-02 MED ORDER — DEXMEDETOMIDINE HCL 200 MCG/2ML IV SOLN
INTRAVENOUS | Status: DC | PRN
  Administered 2019-12-02: 8 ug via INTRAVENOUS

## 2019-12-02 MED ORDER — PROPOFOL 500 MG/50ML IV EMUL
INTRAVENOUS | Status: DC | PRN
  Administered 2019-12-02: 50 mg via INTRAVENOUS
  Administered 2019-12-02: 100 ug/kg/min via INTRAVENOUS
  Administered 2019-12-02: 50 mg via INTRAVENOUS

## 2019-12-02 MED ORDER — SODIUM CHLORIDE 0.9 % IV SOLN
INTRAVENOUS | Status: DC
  Administered 2019-12-02: 1000 mL via INTRAVENOUS

## 2019-12-02 NOTE — Op Note (Signed)
[facility] Gastroenterology Patient Name: Dan [name] Procedure Date: 12/02/2019 10:14 AM MRN: [id number] Account #: [id number] Date of Birth: June 06, 1966 Admit Type: Outpatient Age: 54 Room: [facility] ENDO ROOM 3 Gender: Male Note Status: Finalized Procedure:             Colonoscopy Indications:           Screening for colorectal malignant neoplasm Providers:             [name] MD, MD Referring MD:          Volney American (Referring MD) Medicines:             Monitored Anesthesia Care Complications:         No immediate complications. Procedure:             Pre-Anesthesia Assessment:                        - Prior to the procedure, a History and Physical was                         performed, and patient medications, allergies and                         sensitivities were reviewed. The patient's tolerance                         of previous anesthesia was reviewed.                        - The risks and benefits of the procedure and the                         sedation options and risks were discussed with the                         patient. All questions were answered and informed                         consent was obtained.                        - ASA Grade Assessment: II - A patient with mild                         systemic disease.                        After obtaining informed consent, the colonoscope was                         passed under direct vision. Throughout the procedure,                         the patient's blood pressure, pulse, and oxygen                         saturations were monitored continuously. The                         Colonoscope was introduced through the anus and  advanced to the the cecum, identified by the                         appendiceal orifice. The colonoscopy was performed                         with ease. The patient tolerated the procedure well.                         The quality of  the bowel preparation was excellent. Findings:      The perianal and digital rectal examinations were normal.      Two sessile polyps were found in the descending colon. The polyps were 3       to 6 mm in size. These polyps were removed with a cold snare. Resection       and retrieval were complete.      A 4 mm polyp was found in the sigmoid colon. The polyp was sessile. The       polyp was removed with a cold snare. Resection and retrieval were       complete.      Multiple small-mouthed diverticula were found in the entire colon.      Two sessile polyps were found in the colon. The polyps were 4 to 5 mm in       size. 1 polyp resected with cold snare- very close to internal.       hemorroid- there was some bleeding and stopped after placing a clip       across, another sessile polyp over a hemorroid not resercted      Non-bleeding internal hemorrhoids were found during retroflexion. The       hemorrhoids were large and Grade I (internal hemorrhoids that do not       prolapse).      The exam was otherwise without abnormality on direct and retroflexion       views. Impression:            - Two 3 to 6 mm polyps in the descending colon,                         removed with a cold snare. Resected and retrieved.                        - One 4 mm polyp in the sigmoid colon, removed with a                         cold snare. Resected and retrieved.                        - Diverticulosis in the entire examined colon.                        - Two 4 to 5 mm polyps.                        - Non-bleeding internal hemorrhoids.                        - The examination was otherwise normal on direct and  retroflexion views. Recommendation:        - Discharge patient to home (with escort).                        - Resume previous diet.                        - Continue present medications.                        - Await pathology results.                        - Repeat  colonoscopy for surveillance based on                         pathology results. Procedure Code(s):     --- Professional ---                        [phone], Colonoscopy, flexible; with removal of                         tumor(s), polyp(s), or other lesion(s) by snare                         technique Diagnosis Code(s):     --- Professional ---                        K63.5, Polyp of colon                        Z12.11, Encounter for screening for malignant neoplasm                         of colon                        K64.0, First degree hemorrhoids                        K57.30, Diverticulosis of large intestine without                         perforation or abscess without bleeding CPT copyright 2019 American Medical Association. All rights reserved. The codes documented in this report are preliminary and upon coder review may  be revised to meet current compliance requirements. [name], MD [name] MD, MD 12/02/2019 10:46:17 AM This report has been signed electronically. Number of Addenda: 0 Note Initiated On: 12/02/2019 10:14 AM Scope Withdrawal Time: 0 hours 17 minutes 44 seconds  Total Procedure Duration: 0 hours 18 minutes 48 seconds  Estimated Blood Loss:  Estimated blood loss: none.      [facility]

## 2019-12-02 NOTE — Anesthesia Postprocedure Evaluation (Signed)
Anesthesia Post Note  Patient: Dan [name]  Procedure(s) Performed: COLONOSCOPY WITH PROPOFOL (N/A )  Patient location during evaluation: PACU Anesthesia Type: General Level of consciousness: awake and alert and oriented Pain management: pain level controlled Vital Signs Assessment: post-procedure vital signs reviewed and stable Respiratory status: spontaneous breathing Cardiovascular status: blood pressure returned to baseline Anesthetic complications: no     Last Vitals:  Vitals:   12/02/19 1105 12/02/19 1115  BP: (!) 149/95 (!) 151/95  Pulse:    Resp:    Temp:    SpO2:  99%    Last Pain:  Vitals:   12/02/19 1115  TempSrc:   PainSc: 0-No pain                 Brenisha Tsui

## 2019-12-02 NOTE — H&P (Signed)
Dan Bellows, MD [address] [place], [place], [place], [id number], [place], [place], [place], [address] Phone: [phone]  Fax: [phone]  Primary Care Physician:  Volney American, PA-C   Pre-Procedure History & Physical: HPI:  Dan [name] is a 54 y.o. male is here for an colonoscopy.   Past Medical History:  Diagnosis Date  . Acid reflux   . Asthma   . Diabetes mellitus without complication ([place])   . Hypertension   . Kidney stones     Past Surgical History:  Procedure Laterality Date  . FRACTURE SURGERY Left at age 58   pelvic fracture  . pelvis fractur    . ROTATOR CUFF REPAIR      Prior to Admission medications   Medication Sig Start Date End Date Taking? Authorizing Provider  acetaminophen (TYLENOL) 325 MG tablet Take 650 mg by mouth every 6 (six) hours as needed (cold symptoms).   Yes [provider]  amLODipine (NORVASC) 10 MG tablet TAKE 1 TABLET(10 MG) BY MOUTH DAILY 11/15/19  Yes Volney American, PA-C  etodolac (LODINE) 200 MG capsule Take 1 capsule (200 mg total) by mouth every 6 (six) hours as needed for moderate pain. 11/14/19  Yes Carnella Guadalajara I, NP  hydrochlorothiazide (HYDRODIURIL) 12.5 MG tablet TAKE 1 TABLET(12.5 MG) BY MOUTH DAILY 11/17/19  Yes Volney American, PA-C  losartan (COZAAR) 100 MG tablet TAKE 1 TABLET(100 MG) BY MOUTH DAILY 11/15/19  Yes Volney American, PA-C  omeprazole (PRILOSEC) 40 MG capsule Take 1 capsule (40 mg total) by mouth daily. 09/08/19  Yes Volney American, PA-C  ondansetron (ZOFRAN ODT) 4 MG disintegrating tablet Take 1 tablet (4 mg total) by mouth every 8 (eight) hours as needed for nausea or vomiting. 11/14/19  Yes Carnella Guadalajara I, NP  tamsulosin (FLOMAX) 0.4 MG CAPS capsule Take 1 capsule (0.4 mg total) by mouth at bedtime. 11/14/19  Yes Carnella Guadalajara I, NP  albuterol (VENTOLIN HFA) 108 (90 Base) MCG/ACT inhaler Inhale 2 puffs into the lungs every 6 (six) hours  as needed for wheezing or shortness of breath. 09/08/19   Volney American, PA-C    Allergies as of 11/10/2019 - Review Complete 10/20/2019  Allergen Reaction Noted  . Ibuprofen Nausea And Vomiting 07/25/2015  . Tramadol Other (See Comments) 07/25/2015    Family History  Problem Relation Age of Onset  . Cancer Sister   . Diabetes Brother   . Hypertension Brother   . Heart attack Father   . Asthma Son     Social History   Socioeconomic History  . Marital status: Married    Spouse name: Not on file  . Number of children: Not on file  . Years of education: Not on file  . Highest education level: Not on file  Occupational History  . Not on file  Tobacco Use  . Smoking status: Current Every Day Smoker    Packs/day: 0.50    Years: 25.00    Pack years: 12.50    Types: Cigarettes  . Smokeless tobacco: Never Used  Substance and Sexual Activity  . Alcohol use: Yes    Alcohol/week: 6.0 standard drinks    Types: 6 Cans of beer per week    Comment: weekends only  . Drug use: No  . Sexual activity: Yes  Other Topics Concern  . Not on file  Social History Narrative  . Not on file   Social Determinants of Health  Financial Resource Strain:   . Difficulty of Paying Living Expenses:   Food Insecurity:   . Worried About Charity fundraiser in the Last Year:   . Arboriculturist in the Last Year:   Transportation Needs:   . Film/video editor (Medical):   Marland Kitchen Lack of Transportation (Non-Medical):   Physical Activity:   . Days of Exercise per Week:   . Minutes of Exercise per Session:   Stress:   . Feeling of Stress :   Social Connections:   . Frequency of Communication with Friends and Family:   . Frequency of Social Gatherings with Friends and Family:   . Attends Religious Services:   . Active Member of Clubs or Organizations:   . Attends Archivist Meetings:   Marland Kitchen Marital Status:   Intimate Partner Violence:   . Fear of Current or Ex-Partner:   .  Emotionally Abused:   Marland Kitchen Physically Abused:   . Sexually Abused:     Review of Systems: See HPI, otherwise negative ROS  Physical Exam: BP (!) 156/117   Pulse 72   Temp (!) 96.9 F (36.1 C) (Temporal)   Resp 18   Ht 6' (1.829 m)   Wt 96.6 kg   SpO2 100%   BMI 28.89 kg/m  General:   Alert,  pleasant and cooperative in NAD Head:  Normocephalic and atraumatic. Neck:  Supple; no masses or thyromegaly. Lungs:  Clear throughout to auscultation, normal respiratory effort.    Heart:  +S1, +S2, Regular rate and rhythm, No edema. Abdomen:  Soft, nontender and nondistended. Normal bowel sounds, without guarding, and without rebound.   Neurologic:  Alert and  oriented x4;  grossly normal neurologically.  Impression/Plan: [name] is here for an colonoscopy to be performed for Screening colonoscopy average risk   Risks, benefits, limitations, and alternatives regarding  colonoscopy have been reviewed with the patient.  Questions have been answered.  All parties agreeable.   Dan Bellows, MD  12/02/2019, 10:09 AM

## 2019-12-02 NOTE — Transfer of Care (Signed)
Immediate Anesthesia Transfer of Care Note  Patient: Dan [name]  Procedure(s) Performed: COLONOSCOPY WITH PROPOFOL (N/A )  Patient Location: PACU  Anesthesia Type:General  Level of Consciousness: awake  Airway & Oxygen Therapy: Patient Spontanous Breathing  Post-op Assessment: Report given to RN  Post vital signs: Reviewed and stable  Last Vitals:  Vitals Value Taken Time  BP 121/68 12/02/19 1049  Temp    Pulse 63 12/02/19 1049  Resp 12 12/02/19 1049  SpO2 95 % 12/02/19 1049    Last Pain:  Vitals:   12/02/19 0945  TempSrc: Temporal  PainSc: 0-No pain         Complications: No apparent anesthesia complications

## 2019-12-02 NOTE — Anesthesia Preprocedure Evaluation (Signed)
Anesthesia Evaluation  Patient identified by MRN, date of birth, ID band  Reviewed: Allergy & Precautions, NPO status , Patient's Chart, lab work & pertinent test results  Airway Mallampati: I  TM Distance: >3 FB Neck ROM: Full    Dental  (+) Teeth Intact, Dental Advisory Given   Pulmonary asthma , Current Smoker,    breath sounds clear to auscultation       Cardiovascular hypertension, Pt. on medications  Rhythm:Regular Rate:Normal     Neuro/Psych negative neurological ROS  negative psych ROS   GI/Hepatic Neg liver ROS, GERD  Medicated and Controlled,  Endo/Other  diabetes, Well Controlled, Type 2  Renal/GU   negative genitourinary   Musculoskeletal   Abdominal   Peds negative pediatric ROS (+)  Hematology   Anesthesia Other Findings Past Medical History: No date: Acid reflux No date: Asthma No date: Diabetes mellitus without complication ([facility]) No date: Hypertension No date: Kidney stones  Reproductive/Obstetrics                             Anesthesia Physical  Anesthesia Plan  ASA: II  Anesthesia Plan: General   Post-op Pain Management:    Induction: Intravenous  PONV Risk Score and Plan:   Airway Management Planned: Nasal Cannula  Additional Equipment:   Intra-op Plan:   Post-operative Plan:   Informed Consent: I have reviewed the patients History and Physical, chart, labs and discussed the procedure including the risks, benefits and alternatives for the proposed anesthesia with the patient or authorized representative who has indicated his/her understanding and acceptance.     Dental advisory given  Plan Discussed with: CRNA, Anesthesiologist and Surgeon  Anesthesia Plan Comments:         Anesthesia Quick Evaluation

## 2019-12-05 LAB — SURGICAL PATHOLOGY

## 2019-12-12 ENCOUNTER — Other Ambulatory Visit: Payer: Self-pay | Admitting: Family Medicine

## 2019-12-12 MED ORDER — AMLODIPINE BESYLATE 10 MG PO TABS: ORAL_TABLET | ORAL | 1 refills | Status: DC

## 2019-12-12 MED ORDER — LOSARTAN POTASSIUM 100 MG PO TABS: ORAL_TABLET | ORAL | 1 refills | Status: DC

## 2019-12-12 NOTE — Addendum Note (Signed)
Addended by: Valli Glance F on: 12/12/2019 04:28 PM   Modules accepted: Orders

## 2019-12-12 NOTE — Telephone Encounter (Signed)
Medication Refill - Medication: losartan (COZAAR) 100 MG tablet   and amLODipine (NORVASC) 10 MG tablet [9069]  Has the patient contacted their pharmacy? Yes.   (Agent: If no, request that the patient contact the pharmacy for the refill.) (Agent: If yes, when and what did the pharmacy advise?)  Preferred Pharmacy (with phone number or street name): [facility] [place] - [address] [facility]  [place], [place] [address]  Phone:  [phone] Fax:  [phone]   Agent: Please be advised that RX refills may take up to 3 business days. We ask that you follow-up with your pharmacy.

## 2019-12-12 NOTE — Telephone Encounter (Signed)
Pt requesting losartan and amlodipine to be sent to  [id number] - [place], [address] [place] [address] Phone:  [phone]  Fax:  [phone]

## 2019-12-18 NOTE — Progress Notes (Signed)
Jadijah inform the polyps were pre cancerous- repeat colonoscopy in 3 years. 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

## 2019-12-19 ENCOUNTER — Telehealth: Payer: Self-pay

## 2019-12-19 ENCOUNTER — Other Ambulatory Visit: Payer: Self-pay

## 2019-12-19 DIAGNOSIS — K635 Polyp of colon: Secondary | ICD-10-CM

## 2019-12-19 NOTE — Telephone Encounter (Signed)
-----   Message from [name], MD sent at 12/18/2019  8:10 PM EDT ----- Sherald Hess inform the polyps were pre cancerous- repeat colonoscopy in 3 years. 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

## 2019-12-19 NOTE — Telephone Encounter (Signed)
Spoke with pt and informed him biopsy results and Dr. Georgeann Oppenheim recommendation. Pt understands and agrees to the referral.

## 2019-12-21 ENCOUNTER — Encounter: Payer: Self-pay | Admitting: Surgery

## 2019-12-21 ENCOUNTER — Ambulatory Visit: Payer: BLUE CROSS/BLUE SHIELD | Admitting: Surgery

## 2019-12-21 ENCOUNTER — Other Ambulatory Visit: Payer: Self-pay

## 2019-12-21 VITALS — BP 155/117 | HR 83 | Temp 97.6°F | Resp 12 | Ht 72.0 in | Wt 212.2 lb

## 2019-12-21 DIAGNOSIS — K6289 Other specified diseases of anus and rectum: Secondary | ICD-10-CM

## 2019-12-21 NOTE — Patient Instructions (Signed)
Our surgery scheduler will contact you to schedule your surgery. Please have the [facility] Sheet available when she calls you.  Call the office if you office or concerns.   Hemorrhoids Hemorrhoids are swollen veins in and around the rectum or anus. There are two types of hemorrhoids:  Internal hemorrhoids. These occur in the veins that are just inside the rectum. They may poke through to the outside and become irritated and painful.  External hemorrhoids. These occur in the veins that are outside the anus and can be felt as a painful swelling or hard lump near the anus. Most hemorrhoids do not cause serious problems, and they can be managed with home treatments such as diet and lifestyle changes. If home treatments do not help the symptoms, procedures can be done to shrink or remove the hemorrhoids. What are the causes? This condition is caused by increased pressure in the anal area. This pressure may result from various things, including:  Constipation.  Straining to have a bowel movement.  Diarrhea.  Pregnancy.  Obesity.  Sitting for long periods of time.  Heavy lifting or other activity that causes you to strain.  Anal sex.  Riding a bike for a long period of time. What are the signs or symptoms? Symptoms of this condition include:  Pain.  Anal itching or irritation.  Rectal bleeding.  Leakage of stool (feces).  Anal swelling.  One or more lumps around the anus. How is this diagnosed? This condition can often be diagnosed through a visual exam. Other exams or tests may also be done, such as:  An exam that involves feeling the rectal area with a gloved hand (digital rectal exam).  An exam of the anal canal that is done using a small tube (anoscope).  A blood test, if you have lost a significant amount of blood.  A test to look inside the colon using a flexible tube with a camera on the end (sigmoidoscopy or colonoscopy). How is this treated? This condition can  usually be treated at home. However, various procedures may be done if dietary changes, lifestyle changes, and other home treatments do not help your symptoms. These procedures can help make the hemorrhoids smaller or remove them completely. Some of these procedures involve surgery, and others do not. Common procedures include:  Rubber band ligation. Rubber bands are placed at the base of the hemorrhoids to cut off their blood supply.  Sclerotherapy. Medicine is injected into the hemorrhoids to shrink them.  Infrared coagulation. A type of light energy is used to get rid of the hemorrhoids.  Hemorrhoidectomy surgery. The hemorrhoids are surgically removed, and the veins that supply them are tied off.  Stapled hemorrhoidopexy surgery. The surgeon staples the base of the hemorrhoid to the rectal wall. Follow these instructions at home: Eating and drinking   Eat foods that have a lot of fiber in them, such as whole grains, beans, nuts, fruits, and vegetables.  Ask your health care provider about taking products that have added fiber (fiber supplements).  Reduce the amount of fat in your diet. You can do this by eating low-fat dairy products, eating less red meat, and avoiding processed foods.  Drink enough fluid to keep your urine pale yellow. Managing pain and swelling   Take warm sitz baths for 20 minutes, 3-4 times a day to ease pain and discomfort. You may do this in a bathtub or using a portable sitz bath that fits over the toilet.  If directed, apply ice to  the affected area. Using ice packs between sitz baths may be helpful. ? Put ice in a plastic bag. ? Place a towel between your skin and the bag. ? Leave the ice on for 20 minutes, 2-3 times a day. General instructions  Take over-the-counter and prescription medicines only as told by your health care provider.  Use medicated creams or suppositories as told.  Get regular exercise. Ask your health care provider how much and  what kind of exercise is best for you. In general, you should do moderate exercise for at least 30 minutes on most days of the week (150 minutes each week). This can include activities such as walking, biking, or yoga.  Go to the bathroom when you have the urge to have a bowel movement. Do not wait.  Avoid straining to have bowel movements.  Keep the anal area dry and clean. Use wet toilet paper or moist towelettes after a bowel movement.  Do not sit on the toilet for long periods of time. This increases blood pooling and pain.  Keep all follow-up visits as told by your health care provider. This is important. Contact a health care provider if you have:  Increasing pain and swelling that are not controlled by treatment or medicine.  Difficulty having a bowel movement, or you are unable to have a bowel movement.  Pain or inflammation outside the area of the hemorrhoids. Get help right away if you have:  Uncontrolled bleeding from your rectum. Summary  Hemorrhoids are swollen veins in and around the rectum or anus.  Most hemorrhoids can be managed with home treatments such as diet and lifestyle changes.  Taking warm sitz baths can help ease pain and discomfort.  In severe cases, procedures or surgery can be done to shrink or remove the hemorrhoids. This information is not intended to replace advice given to you by your health care provider. Make sure you discuss any questions you have with your health care provider. Document Revised: 01/07/2019 Document Reviewed: 12/31/2017 Elsevier Patient Education  [place].

## 2019-12-22 ENCOUNTER — Telehealth: Payer: Self-pay | Admitting: Surgery

## 2019-12-22 NOTE — Telephone Encounter (Signed)
Pt has been advised of Pre-Admission date/time, COVID Testing date and Surgery date.  Surgery Date: 01/31/20 Preadmission Testing Date: 01/20/20 (phone 1p-5p) Covid Testing Date: 01/27/20 - patient advised to go to the [facility] ([place]) between 8a-1p   Patient has been made aware to call [phone], between 1-3:00pm the day before surgery, to find out what time to arrive for surgery.

## 2019-12-23 NOTE — Progress Notes (Signed)
Surgical Consultation  12/23/2019  Dan [name] is an 54 y.o. male.   Chief Complaint  Patient presents with  . New Patient (Initial Visit)    Resection of Colon Polyp on top of hemorrhoid     HPI: 54 year old male seen in consultation at the request of Dr. Vicente Males after visualizing a rectal lesion on top of a hemorrhoid.  He did have polyps appendectomy performed during the same time.  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 He denies any rectal pain denies any rectal bleeding.  No fevers no chills.  He has done well after the colonoscopy.  Is not that I have personally reviewed the endoscopic images.  Past Medical History:  Diagnosis Date  . Acid reflux   . Asthma   . Diabetes mellitus without complication ([place])   . Hypertension   . Kidney stones     Past Surgical History:  Procedure Laterality Date  . COLONOSCOPY WITH PROPOFOL N/A 12/02/2019   Procedure: COLONOSCOPY WITH PROPOFOL;  Surgeon: [name], MD;  Location: [facility] ENDOSCOPY;  Service: Gastroenterology;  Laterality: N/A;  . FRACTURE SURGERY Left at age 71   pelvic fracture  . pelvis fractur    . ROTATOR CUFF REPAIR      Family History  Problem Relation Age of Onset  . Cancer Sister   . Diabetes Brother   . Hypertension Brother   . Heart attack Father   . Asthma Son     Social History:  reports that he has been smoking cigarettes. He has a 12.50 pack-year smoking history. He has never used smokeless tobacco. He reports current alcohol use of about 6.0 standard drinks of alcohol per week. He reports that he does not use drugs.  Allergies:  Allergies  Allergen Reactions  . Ibuprofen Nausea And Vomiting  . Tramadol Other (See Comments)    Headaches.     Medications reviewed.     ROS Full ROS performed and is otherwise negative other than what is stated in the HPI    BP (!) 155/117   Pulse 83   Temp 97.6 F (36.4 C)   Resp 12   Ht 6' (1.829 m)   Wt 212 lb 3.2 oz (96.3 kg)   SpO2 96%   BMI 28.78  kg/m   Physical Exam Vitals and nursing note reviewed. Exam conducted with a chaperone present.  Constitutional:      General: He is not in acute distress.    Appearance: Normal appearance. He is normal weight.  Eyes:     General: No scleral icterus.       Right eye: No discharge.        Left eye: No discharge.  Cardiovascular:     Rate and Rhythm: Normal rate and regular rhythm.  Pulmonary:     Effort: Pulmonary effort is normal. No respiratory distress.     Breath sounds: Normal breath sounds. No stridor.  Abdominal:     General: Abdomen is flat. There is no distension.     Palpations: There is no mass.     Tenderness: There is no abdominal tenderness. There is no guarding or rebound.     Hernia: No hernia is present.  Genitourinary:    Comments: Perirectal exam performed.  There is no evidence of anal lesions.  Rectally no palpable both lesions were appreciated.  Exam is limited due to discomfort. Musculoskeletal:        General: No swelling or tenderness. Normal range of motion.  Cervical back: Normal range of motion and neck supple. No rigidity.  Skin:    General: Skin is warm and dry.     Capillary Refill: Capillary refill takes less than 2 seconds.  Neurological:     General: No focal deficit present.     Mental Status: He is alert and oriented to person, place, and time.  Psychiatric:        Mood and Affect: Mood normal.        Behavior: Behavior normal.        Thought Content: Thought content normal.        Judgment: Judgment normal.        Assessment/Plan: 54 year old male with a rectal mass unable to be removed endoscopically.  DisCussed with the patient in detail about the next course of action.  I do recommend examination under anesthesia and removal of rectal lesion transrectally.  I do not feel any masses and this does not seem to be malignant in nature.  Procedure discussed with the patient in detail.  Risks, benefits and possible implications discussed  with the patient in detail.  He understands and wishes to proceed.  A copy of this report was sent to the referring provider  Caroleen Hamman, MD [facility] General Surgeon

## 2020-01-20 ENCOUNTER — Encounter
Admission: RE | Admit: 2020-01-20 | Discharge: 2020-01-20 | Disposition: A | Discharge status: Home / Self Care | Payer: BLUE CROSS/BLUE SHIELD | Source: Ambulatory Visit | Attending: Surgery | Admitting: Surgery

## 2020-01-20 ENCOUNTER — Other Ambulatory Visit: Payer: Self-pay

## 2020-01-20 HISTORY — DX: Personal history of urinary calculi: Z87.442

## 2020-01-20 NOTE — Patient Instructions (Signed)
Your procedure is scheduled on: Tuesday January 31, 2020. Report to Day Surgery. To find out your arrival time please call [phone] between 1PM - 3PM on Monday January 30, 2020.  Remember: Instructions that are not followed completely may result in serious medical risk,  up to and including death, or upon the discretion of your surgeon and anesthesiologist your  surgery may need to be rescheduled.     _X__ 1. Do not eat food after midnight the night before your procedure.                 No gum chewing or hard candies. You may drink clear liquids up to 2 hours                 before you are scheduled to arrive for your surgery- DO not drink clear                 liquids within 2 hours of the start of your surgery.                 Clear Liquids include:  water, apple juice without pulp, clear Gatorade, G2 or                  Gatorade Zero (avoid Red/Purple/Blue), Black Coffee or Tea (Do not add                 anything to coffee or tea).  __X__2.  On the morning of surgery brush your teeth with toothpaste and water, you                may rinse your mouth with mouthwash if you wish.  Do not swallow any toothpaste of mouthwash.     _X__ 3.  No Alcohol for 24 hours before or after surgery.   _X__ 4.  Do Not Smoke or use e-cigarettes For 24 Hours Prior to Your Surgery.                 Do not use any chewable tobacco products for at least 6 hours prior to                 Surgery.  _X__  5.  Do not use any recreational drugs (marijuana, cocaine, heroin, ecstacy, MDMA or other)                For at least one week prior to your surgery.  Combination of these drugs with anesthesia                May have life threatening results.  __X__ 6.  Notify your doctor if there is any change in your medical condition      (cold, fever, infections).     Do not wear jewelry, make-up, hairpins, clips or nail polish. Do not wear lotions, powders, or perfumes. You may wear  deodorant. Do not shave 48 hours prior to surgery. Men may shave face and neck. Do not bring valuables to the hospital.    [facility] is not responsible for any belongings or valuables.  Contacts, dentures or bridgework may not be worn into surgery. Leave your suitcase in the car. After surgery it may be brought to your room. For patients admitted to the hospital, discharge time is determined by your treatment team.   Patients discharged the day of surgery will not be allowed to drive home.   Make arrangements for someone to be with you for the first  24 hours of your Same Day Discharge.   __X__ Take these medicines the morning of surgery with A SIP OF WATER:    1. amLODipine (NORVASC) 10  2. omeprazole (PRILOSEC) 40 MG   __X__ Use CHG Soap (or wipes) as directed  __X__ Use inhalers on the day of surgery  albuterol (VENTOLIN HFA) 108 (90 Base) MCG/ACT inhaler  fluticasone (FLONASE) 50 MCG/ACT nasal spray   __X__ Stop Anti-inflammatories such as ibuprofen, Aleve, anproxen, aspirin, etodolac (LODINE) and or BC powders.    __X__ Stop supplements until after surgery.    __X__ Do not start any herbal supplements before your surgery.

## 2020-01-27 ENCOUNTER — Encounter
Admission: RE | Admit: 2020-01-27 | Discharge: 2020-01-27 | Disposition: A | Discharge status: Home / Self Care | Payer: BLUE CROSS/BLUE SHIELD | Source: Ambulatory Visit | Attending: Surgery | Admitting: Surgery

## 2020-01-27 ENCOUNTER — Other Ambulatory Visit: Payer: BLUE CROSS/BLUE SHIELD

## 2020-01-27 ENCOUNTER — Other Ambulatory Visit: Payer: Self-pay

## 2020-01-27 DIAGNOSIS — Z20822 Contact with and (suspected) exposure to covid-19: Secondary | ICD-10-CM | POA: Diagnosis not present

## 2020-01-27 DIAGNOSIS — I4510 Unspecified right bundle-branch block: Secondary | ICD-10-CM | POA: Insufficient documentation

## 2020-01-27 DIAGNOSIS — Z01818 Encounter for other preprocedural examination: Secondary | ICD-10-CM | POA: Insufficient documentation

## 2020-01-27 DIAGNOSIS — I10 Essential (primary) hypertension: Secondary | ICD-10-CM | POA: Insufficient documentation

## 2020-01-27 DIAGNOSIS — R9431 Abnormal electrocardiogram [ECG] [EKG]: Secondary | ICD-10-CM | POA: Diagnosis not present

## 2020-01-27 LAB — CBC
HCT: 39.1 % (ref 39.0–52.0)
Hemoglobin: 13.3 g/dL (ref 13.0–17.0)
MCH: 29.1 pg (ref 26.0–34.0)
MCHC: 34 g/dL (ref 30.0–36.0)
MCV: 85.6 fL (ref 80.0–100.0)
Platelets: 236 10*3/uL (ref 150–400)
RBC: 4.57 MIL/uL (ref 4.22–5.81)
RDW: 13.4 % (ref 11.5–15.5)
WBC: 6.2 10*3/uL (ref 4.0–10.5)
nRBC: 0 % (ref 0.0–0.2)

## 2020-01-27 LAB — BASIC METABOLIC PANEL
Anion gap: 8 (ref 5–15)
BUN: 13 mg/dL (ref 6–20)
CO2: 27 mmol/L (ref 22–32)
Calcium: 9.1 mg/dL (ref 8.9–10.3)
Chloride: 105 mmol/L (ref 98–111)
Creatinine, Ser: 0.9 mg/dL (ref 0.61–1.24)
GFR calc Af Amer: >60 mL/min (ref >60)
GFR calc non Af Amer: >60 mL/min (ref >60)
Glucose, Bld: 106 mg/dL — ABNORMAL HIGH (ref 70–99)
Potassium: 3.9 mmol/L (ref 3.5–5.1)
Sodium: 140 mmol/L (ref 135–145)

## 2020-01-27 LAB — SARS CORONAVIRUS 2 (TAT 6-24 HRS): SARS Coronavirus 2: NEGATIVE

## 2020-01-30 MED ORDER — SODIUM CHLORIDE 0.9 % IV SOLN
2.0000 g | INTRAVENOUS | Status: AC
  Administered 2020-01-31: 2 g via INTRAVENOUS

## 2020-01-30 MED ORDER — FENTANYL CITRATE (PF) 100 MCG/2ML IJ SOLN
25.0000 ug | INTRAMUSCULAR | Status: DC | PRN
Start: 2020-01-30 — End: 2020-01-30

## 2020-01-30 MED ORDER — ONDANSETRON HCL 4 MG/2ML IJ SOLN: 4.0000 mg | Freq: Once | INTRAMUSCULAR | Status: DC | PRN

## 2020-01-31 ENCOUNTER — Encounter: Payer: Self-pay | Admitting: Surgery

## 2020-01-31 ENCOUNTER — Encounter
Admission: RE | Disposition: A | Discharge status: Home / Self Care | Payer: Self-pay | Source: Home / Self Care | Attending: Surgery

## 2020-01-31 ENCOUNTER — Ambulatory Visit
Admission: RE | Admit: 2020-01-31 | Discharge: 2020-01-31 | Disposition: A | Discharge status: Home / Self Care | Payer: BLUE CROSS/BLUE SHIELD | Attending: Surgery | Admitting: Surgery

## 2020-01-31 ENCOUNTER — Ambulatory Visit: Payer: BLUE CROSS/BLUE SHIELD | Admitting: Anesthesiology

## 2020-01-31 ENCOUNTER — Other Ambulatory Visit: Payer: Self-pay

## 2020-01-31 DIAGNOSIS — K6289 Other specified diseases of anus and rectum: Secondary | ICD-10-CM

## 2020-01-31 DIAGNOSIS — Z8249 Family history of ischemic heart disease and other diseases of the circulatory system: Secondary | ICD-10-CM | POA: Insufficient documentation

## 2020-01-31 DIAGNOSIS — I10 Essential (primary) hypertension: Secondary | ICD-10-CM | POA: Diagnosis present

## 2020-01-31 DIAGNOSIS — K219 Gastro-esophageal reflux disease without esophagitis: Secondary | ICD-10-CM | POA: Insufficient documentation

## 2020-01-31 DIAGNOSIS — E119 Type 2 diabetes mellitus without complications: Secondary | ICD-10-CM | POA: Insufficient documentation

## 2020-01-31 DIAGNOSIS — K621 Rectal polyp: Secondary | ICD-10-CM | POA: Insufficient documentation

## 2020-01-31 DIAGNOSIS — Z886 Allergy status to analgesic agent status: Secondary | ICD-10-CM | POA: Diagnosis not present

## 2020-01-31 DIAGNOSIS — F17210 Nicotine dependence, cigarettes, uncomplicated: Secondary | ICD-10-CM | POA: Insufficient documentation

## 2020-01-31 DIAGNOSIS — J45909 Unspecified asthma, uncomplicated: Secondary | ICD-10-CM | POA: Diagnosis not present

## 2020-01-31 DIAGNOSIS — Z885 Allergy status to narcotic agent status: Secondary | ICD-10-CM | POA: Insufficient documentation

## 2020-01-31 DIAGNOSIS — Z833 Family history of diabetes mellitus: Secondary | ICD-10-CM | POA: Insufficient documentation

## 2020-01-31 HISTORY — PX: HEMORRHOID SURGERY: SHX153

## 2020-01-31 LAB — GLUCOSE, CAPILLARY
Glucose-Capillary: 131 mg/dL — ABNORMAL HIGH (ref 70–99)
Glucose-Capillary: 131 mg/dL — ABNORMAL HIGH (ref 70–99)

## 2020-01-31 SURGERY — EXAM UNDER ANESTHESIA
Anesthesia: General | Site: Rectum

## 2020-01-31 MED ORDER — OXYCODONE HCL 5 MG PO TABS: 5.0000 mg | ORAL_TABLET | Freq: Once | ORAL | Status: DC | PRN

## 2020-01-31 MED ORDER — HYDROCODONE-ACETAMINOPHEN 5-325 MG PO TABS: 1.0000 | ORAL_TABLET | Freq: Four times a day (QID) | ORAL | 0 refills | Status: DC | PRN

## 2020-01-31 MED ORDER — SODIUM CHLORIDE (PF) 0.9 % IJ SOLN
INTRAMUSCULAR | Status: AC
  Filled 2020-01-31: qty 50

## 2020-01-31 MED ORDER — MIDAZOLAM HCL 2 MG/2ML IJ SOLN
INTRAMUSCULAR | Status: AC
  Filled 2020-01-31: qty 2

## 2020-01-31 MED ORDER — LIDOCAINE HCL (CARDIAC) PF 100 MG/5ML IV SOSY
PREFILLED_SYRINGE | INTRAVENOUS | Status: DC | PRN
  Administered 2020-01-31: 20 mg via INTRAVENOUS
  Administered 2020-01-31: 80 mg via INTRAVENOUS

## 2020-01-31 MED ORDER — FAMOTIDINE 20 MG PO TABS
ORAL_TABLET | ORAL | Status: AC
  Administered 2020-01-31: 20 mg
  Filled 2020-01-31: qty 1

## 2020-01-31 MED ORDER — GABAPENTIN 300 MG PO CAPS: 300.0000 mg | ORAL_CAPSULE | ORAL | Status: AC

## 2020-01-31 MED ORDER — FAMOTIDINE 20 MG PO TABS: 20.0000 mg | ORAL_TABLET | Freq: Once | ORAL | Status: DC

## 2020-01-31 MED ORDER — CHLORHEXIDINE GLUCONATE 0.12 % MT SOLN
OROMUCOSAL | Status: AC
  Administered 2020-01-31: 15 mL via OROMUCOSAL
  Filled 2020-01-31: qty 15

## 2020-01-31 MED ORDER — PHENYLEPHRINE HCL (PRESSORS) 10 MG/ML IV SOLN
INTRAVENOUS | Status: DC | PRN
  Administered 2020-01-31 (×2): 100 ug via INTRAVENOUS

## 2020-01-31 MED ORDER — ORAL CARE MOUTH RINSE: 15.0000 mL | Freq: Once | OROMUCOSAL | Status: AC

## 2020-01-31 MED ORDER — ACETAMINOPHEN 500 MG PO TABS: 1000.0000 mg | ORAL_TABLET | ORAL | Status: AC

## 2020-01-31 MED ORDER — SODIUM CHLORIDE 0.9 % IV SOLN: INTRAVENOUS | Status: DC

## 2020-01-31 MED ORDER — GABAPENTIN 300 MG PO CAPS
ORAL_CAPSULE | ORAL | Status: AC
  Administered 2020-01-31: 300 mg via ORAL
  Filled 2020-01-31: qty 1

## 2020-01-31 MED ORDER — SODIUM CHLORIDE 0.9 % IV SOLN
INTRAVENOUS | Status: AC
  Filled 2020-01-31: qty 2

## 2020-01-31 MED ORDER — ACETAMINOPHEN 500 MG PO TABS
ORAL_TABLET | ORAL | Status: AC
  Administered 2020-01-31: 1000 mg via ORAL
  Filled 2020-01-31: qty 2

## 2020-01-31 MED ORDER — FENTANYL CITRATE (PF) 100 MCG/2ML IJ SOLN
INTRAMUSCULAR | Status: DC | PRN
  Administered 2020-01-31: 50 ug via INTRAVENOUS

## 2020-01-31 MED ORDER — PROPOFOL 10 MG/ML IV BOLUS
INTRAVENOUS | Status: DC | PRN
  Administered 2020-01-31: 160 mg via INTRAVENOUS
  Administered 2020-01-31: 40 mg via INTRAVENOUS

## 2020-01-31 MED ORDER — PHENYLEPHRINE HCL (PRESSORS) 10 MG/ML IV SOLN
INTRAVENOUS | Status: AC
  Filled 2020-01-31: qty 1

## 2020-01-31 MED ORDER — 0.9 % SODIUM CHLORIDE (POUR BTL) OPTIME
TOPICAL | Status: DC | PRN
  Administered 2020-01-31: 500 mL

## 2020-01-31 MED ORDER — SODIUM CHLORIDE (PF) 0.9 % IJ SOLN
INTRAMUSCULAR | Status: AC
  Filled 2020-01-31: qty 10

## 2020-01-31 MED ORDER — GLYCOPYRROLATE 0.2 MG/ML IJ SOLN
INTRAMUSCULAR | Status: AC
  Filled 2020-01-31: qty 1

## 2020-01-31 MED ORDER — ONDANSETRON HCL 4 MG/2ML IJ SOLN
INTRAMUSCULAR | Status: DC | PRN
  Administered 2020-01-31: 4 mg via INTRAVENOUS

## 2020-01-31 MED ORDER — EPHEDRINE SULFATE 50 MG/ML IJ SOLN
INTRAMUSCULAR | Status: DC | PRN
  Administered 2020-01-31: 7.5 mg via INTRAVENOUS

## 2020-01-31 MED ORDER — BUPIVACAINE LIPOSOME 1.3 % IJ SUSP
INTRAMUSCULAR | Status: DC | PRN
  Administered 2020-01-31: 20 mL

## 2020-01-31 MED ORDER — LIDOCAINE HCL (PF) 2 % IJ SOLN
INTRAMUSCULAR | Status: AC
  Filled 2020-01-31: qty 5

## 2020-01-31 MED ORDER — DEXAMETHASONE SODIUM PHOSPHATE 10 MG/ML IJ SOLN
INTRAMUSCULAR | Status: AC
  Filled 2020-01-31: qty 1

## 2020-01-31 MED ORDER — KETAMINE HCL 50 MG/ML IJ SOLN
INTRAMUSCULAR | Status: AC
  Filled 2020-01-31: qty 10

## 2020-01-31 MED ORDER — PROPOFOL 10 MG/ML IV BOLUS
INTRAVENOUS | Status: AC
  Filled 2020-01-31: qty 20

## 2020-01-31 MED ORDER — MIDAZOLAM HCL 2 MG/2ML IJ SOLN
INTRAMUSCULAR | Status: DC | PRN
  Administered 2020-01-31: 2 mg via INTRAVENOUS

## 2020-01-31 MED ORDER — BUPIVACAINE HCL (PF) 0.25 % IJ SOLN
INTRAMUSCULAR | Status: AC
  Filled 2020-01-31: qty 30

## 2020-01-31 MED ORDER — BUPIVACAINE LIPOSOME 1.3 % IJ SUSP
INTRAMUSCULAR | Status: AC
  Filled 2020-01-31: qty 20

## 2020-01-31 MED ORDER — ONDANSETRON HCL 4 MG/2ML IJ SOLN
INTRAMUSCULAR | Status: AC
  Filled 2020-01-31: qty 2

## 2020-01-31 MED ORDER — GLYCOPYRROLATE 0.2 MG/ML IJ SOLN
INTRAMUSCULAR | Status: DC | PRN
  Administered 2020-01-31: .2 mg via INTRAVENOUS

## 2020-01-31 MED ORDER — CHLORHEXIDINE GLUCONATE CLOTH 2 % EX PADS: 6.0000 | MEDICATED_PAD | Freq: Once | CUTANEOUS | Status: DC

## 2020-01-31 MED ORDER — FENTANYL CITRATE (PF) 100 MCG/2ML IJ SOLN: 25.0000 ug | INTRAMUSCULAR | Status: DC | PRN

## 2020-01-31 MED ORDER — FENTANYL CITRATE (PF) 100 MCG/2ML IJ SOLN
INTRAMUSCULAR | Status: AC
  Filled 2020-01-31: qty 2

## 2020-01-31 MED ORDER — CHLORHEXIDINE GLUCONATE 0.12 % MT SOLN: 15.0000 mL | Freq: Once | OROMUCOSAL | Status: AC

## 2020-01-31 MED ORDER — DEXMEDETOMIDINE HCL IN NACL 80 MCG/20ML IV SOLN
INTRAVENOUS | Status: AC
  Filled 2020-01-31: qty 20

## 2020-01-31 MED ORDER — DEXAMETHASONE SODIUM PHOSPHATE 10 MG/ML IJ SOLN
INTRAMUSCULAR | Status: DC | PRN
  Administered 2020-01-31: 8 mg via INTRAVENOUS

## 2020-01-31 MED ORDER — OXYCODONE HCL 5 MG/5ML PO SOLN: 5.0000 mg | Freq: Once | ORAL | Status: DC | PRN

## 2020-01-31 MED ORDER — KETAMINE HCL 10 MG/ML IJ SOLN
INTRAMUSCULAR | Status: DC | PRN
  Administered 2020-01-31: 10 mg via INTRAVENOUS

## 2020-01-31 MED ORDER — DEXMEDETOMIDINE HCL 200 MCG/2ML IV SOLN
INTRAVENOUS | Status: DC | PRN
  Administered 2020-01-31: 8 ug via INTRAVENOUS
  Administered 2020-01-31: 12 ug via INTRAVENOUS

## 2020-01-31 SURGICAL SUPPLY — 28 items
BLADE CLIPPER SURG (BLADE) ×3 IMPLANT
BLADE SURG 15 STRL LF DISP TIS (BLADE) ×2 IMPLANT
BLADE SURG 15 STRL SS (BLADE) ×3
BRIEF STRETCH MATERNITY 2XLG (MISCELLANEOUS) ×3 IMPLANT
CANISTER SUCT 1200ML W/VALVE (MISCELLANEOUS) ×3 IMPLANT
CANISTER SUCT 3000ML PPV (MISCELLANEOUS) ×3 IMPLANT
COVER WAND RF STERILE (DRAPES) ×3 IMPLANT
DRAPE 3/4 80X56 (DRAPES) ×3 IMPLANT
DRAPE LEGGINS SURG 28X43 STRL (DRAPES) ×3 IMPLANT
DRAPE UNDER BUTTOCK W/FLU (DRAPES) ×3 IMPLANT
DRSG GAUZE FLUFF 36X18 (GAUZE/BANDAGES/DRESSINGS) ×3 IMPLANT
ELECT CAUTERY BLADE 6.4 (BLADE) ×3 IMPLANT
ELECT REM PT RETURN 9FT ADLT (ELECTROSURGICAL) ×3
ELECTRODE REM PT RTRN 9FT ADLT (ELECTROSURGICAL) ×2 IMPLANT
GLOVE BIO SURGEON STRL SZ7 (GLOVE) ×3 IMPLANT
GOWN STRL REUS W/ TWL LRG LVL3 (GOWN DISPOSABLE) ×4 IMPLANT
GOWN STRL REUS W/TWL LRG LVL3 (GOWN DISPOSABLE) ×6
NEEDLE HYPO 22GX1.5 SAFETY (NEEDLE) ×3 IMPLANT
NS IRRIG 1000ML POUR BTL (IV SOLUTION) ×3 IMPLANT
NS IRRIG 500ML POUR BTL (IV SOLUTION) ×3 IMPLANT
PACK BASIN MINOR (MISCELLANEOUS) ×3 IMPLANT
PAD PREP 24X41 OB/GYN DISP (PERSONAL CARE ITEMS) ×3 IMPLANT
SHEARS HARMONIC 9CM CVD (BLADE) ×3 IMPLANT
SOL PREP PVP 2OZ (MISCELLANEOUS) ×3
SOLUTION PREP PVP 2OZ (MISCELLANEOUS) ×2 IMPLANT
SPONGE LAP 18X18 RF (DISPOSABLE) ×3 IMPLANT
SURGILUBE 2OZ TUBE FLIPTOP (MISCELLANEOUS) ×3 IMPLANT
SYR 20ML LL LF (SYRINGE) ×3 IMPLANT

## 2020-01-31 NOTE — Anesthesia Postprocedure Evaluation (Signed)
Anesthesia Post Note  Patient: Dan [name]  Procedure(s) Performed: EXAM UNDER ANESTHESIA (N/A Rectum) HEMORRHOIDECTOMY (N/A Rectum)  Patient location during evaluation: PACU Anesthesia Type: General Level of consciousness: awake and alert Pain management: pain level controlled Vital Signs Assessment: post-procedure vital signs reviewed and stable Respiratory status: spontaneous breathing, nonlabored ventilation, respiratory function stable and patient connected to nasal cannula oxygen Cardiovascular status: blood pressure returned to baseline and stable Postop Assessment: no apparent nausea or vomiting Anesthetic complications: no     Last Vitals:  Vitals:   01/31/20 0858 01/31/20 0905  BP:    Pulse:    Resp:    Temp:  36.5 C  SpO2: 97%     Last Pain:  Vitals:   01/31/20 0852  PainSc: 0-No pain                 Precious Haws Lissett Favorite

## 2020-01-31 NOTE — H&P (Signed)
Chief Complaint  Patient presents with  . New Patient (Initial Visit)    Resection of Colon Polyp on top of hemorrhoid     HPI: 54 year old male seen in consultation at the request of Dr. Vicente Males after visualizing a rectal lesion on top of a hemorrhoid.  He did have polyps appendectomy performed during the same time.  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 He denies any rectal pain denies any rectal bleeding.  No fevers no chills.  He has done well after the colonoscopy.  Is not that I have personally reviewed the endoscopic images.      Past Medical History:  Diagnosis Date  . Acid reflux   . Asthma   . Diabetes mellitus without complication ([place])   . Hypertension   . Kidney stones          Past Surgical History:  Procedure Laterality Date  . COLONOSCOPY WITH PROPOFOL N/A 12/02/2019   Procedure: COLONOSCOPY WITH PROPOFOL;  Surgeon: [name], MD;  Location: [facility] ENDOSCOPY;  Service: Gastroenterology;  Laterality: N/A;  . FRACTURE SURGERY Left at age 36   pelvic fracture  . pelvis fractur    . ROTATOR CUFF REPAIR           Family History  Problem Relation Age of Onset  . Cancer Sister   . Diabetes Brother   . Hypertension Brother   . Heart attack Father   . Asthma Son     Social History:  reports that he has been smoking cigarettes. He has a 12.50 pack-year smoking history. He has never used smokeless tobacco. He reports current alcohol use of about 6.0 standard drinks of alcohol per week. He reports that he does not use drugs.  Allergies:       Allergies  Allergen Reactions  . Ibuprofen Nausea And Vomiting  . Tramadol Other (See Comments)    Headaches.     Medications reviewed.     ROS Full ROS performed and is otherwise negative other than what is stated in the HPI   Physical Exam Vitals and nursing note reviewed. Exam conducted with a chaperone present.  Constitutional:      General: He is not in acute distress.  Appearance: Normal appearance. He is normal weight.  Eyes:     General: No scleral icterus.       Right eye: No discharge.        Left eye: No discharge.  Cardiovascular:     Rate and Rhythm: Normal rate and regular rhythm.  Pulmonary:     Effort: Pulmonary effort is normal. No respiratory distress.     Breath sounds: Normal breath sounds. No stridor.  Abdominal:     General: Abdomen is flat. There is no distension.     Palpations: There is no mass.     Tenderness: There is no abdominal tenderness. There is no guarding or rebound.     Hernia: No hernia is present.  Genitourinary:    Comments: Perirectal exam performed.  There is no evidence of anal lesions.  Rectally no palpable both lesions were appreciated.  Exam is limited due to discomfort. Musculoskeletal:        General: No swelling or tenderness. Normal range of motion.     Cervical back: Normal range of motion and neck supple. No rigidity.  Skin:    General: Skin is warm and dry.     Capillary Refill: Capillary refill takes less than 2 seconds.  Neurological:     General: No focal  deficit present.     Mental Status: He is alert and oriented to person, place, and time.  Psychiatric:        Mood and Affect: Mood normal.        Behavior: Behavior normal.        Thought Content: Thought content normal.        Judgment: Judgment normal.        Assessment/Plan: 54 year old male with a rectal mass unable to be removed endoscopically.  DisCussed with the patient in detail about the next course of action.  I do recommend examination under anesthesia and removal of rectal lesion transrectally.  I do not feel any masses and this does not seem to be malignant in nature.  Procedure discussed with the patient in detail.  Risks, benefits and possible implications discussed with the patient in detail.  He understands and wishes to proceed.  A copy of this report was sent to the referring provider  Caroleen Hamman, MD [facility] General  Surgeon

## 2020-01-31 NOTE — Anesthesia Procedure Notes (Signed)
Procedure Name: LMA Insertion Date/Time: 01/31/2020 7:35 AM Performed by: Lia Foyer, CRNA Pre-anesthesia Checklist: Patient identified, Emergency Drugs available, Suction available and Patient being monitored Patient Re-evaluated:Patient Re-evaluated prior to induction Oxygen Delivery Method: Circle system utilized Preoxygenation: Pre-oxygenation with 100% oxygen Induction Type: IV induction Ventilation: Mask ventilation without difficulty LMA: LMA inserted LMA Size: 4.5 Number of attempts: 1 Airway Equipment and Method: Patient positioned with wedge pillow Placement Confirmation: positive ETCO2 and breath sounds checked- equal and bilateral Tube secured with: Tape Dental Injury: Teeth and Oropharynx as per pre-operative assessment

## 2020-01-31 NOTE — Transfer of Care (Signed)
Immediate Anesthesia Transfer of Care Note  Patient: Dan [name]  Procedure(s) Performed: EXAM UNDER ANESTHESIA (N/A Rectum) HEMORRHOIDECTOMY (N/A Rectum)  Patient Location: PACU  Anesthesia Type:General  Level of Consciousness: drowsy  Airway & Oxygen Therapy: Patient Spontanous Breathing and Patient connected to face mask oxygen  Post-op Assessment: Report given to RN and Post -op Vital signs reviewed and stable  Post vital signs: Reviewed and stable  Last Vitals:  Vitals Value Taken Time  BP 121/79 (92) 0818  Temp 36.5 C 0818  Pulse 76 0818  Resp 12 0818  SpO2 99 % 0818    Last Pain:  Vitals:   01/31/20 0625  PainSc: 0-No pain         Complications: No apparent anesthesia complications

## 2020-01-31 NOTE — Anesthesia Preprocedure Evaluation (Signed)
Anesthesia Evaluation  Patient identified by MRN, date of birth, ID band Patient awake    Reviewed: Allergy & Precautions, H&P , NPO status , Patient's Chart, lab work & pertinent test results  History of Anesthesia Complications Negative for: history of anesthetic complications  Airway Mallampati: II  TM Distance: >3 FB Neck ROM: full    Dental  (+) Chipped, Poor Dentition, Missing   Pulmonary neg shortness of breath, asthma , Current Smoker and Patient abstained from smoking.,    Pulmonary exam normal        Cardiovascular Exercise Tolerance: Good hypertension, (-) angina(-) Past MI and (-) DOE Normal cardiovascular exam     Neuro/Psych negative neurological ROS  negative psych ROS   GI/Hepatic Neg liver ROS, GERD  Medicated and Controlled,  Endo/Other  diabetes, Type 2  Renal/GU Renal disease     Musculoskeletal   Abdominal   Peds  Hematology negative hematology ROS (+)   Anesthesia Other Findings Past Medical History: No date: Acid reflux No date: Asthma No date: Diabetes mellitus without complication (H[facility]) No date: History of kidney stones No date: Hypertension No date: Kidney stones  Past Surgical History: 12/02/2019: COLONOSCOPY WITH PROPOFOL; N/A     Comment:  Procedure: COLONOSCOPY WITH PROPOFOL;  Surgeon: [name], MD;  Location: [facility] ENDOSCOPY;  Service:               Gastroenterology;  Laterality: N/A; at age 54: FRACTURE SURGERY; Left     Comment:  pelvic fracture No date: pelvis fractur No date: ROTATOR CUFF REPAIR  BMI    Body Mass Index: 28.70 kg/m      Reproductive/Obstetrics negative OB ROS                             Anesthesia Physical Anesthesia Plan  ASA: III  Anesthesia Plan: General LMA   Post-op Pain Management:    Induction: Intravenous  PONV Risk Score and Plan: Dexamethasone, Ondansetron, Midazolam and Treatment may vary  due to age or medical condition  Airway Management Planned: LMA  Additional Equipment:   Intra-op Plan:   Post-operative Plan: Extubation in OR  Informed Consent: I have reviewed the patients History and Physical, chart, labs and discussed the procedure including the risks, benefits and alternatives for the proposed anesthesia with the patient or authorized representative who has indicated his/her understanding and acceptance.     Dental Advisory Given  Plan Discussed with: Anesthesiologist, CRNA and Surgeon  Anesthesia Plan Comments: (Patient consented for risks of anesthesia including but not limited to:  - adverse reactions to medications - damage to eyes, teeth, lips or other oral mucosa - nerve damage due to positioning  - sore throat or hoarseness - Damage to heart, brain, nerves, lungs, other parts of body or loss of life  Patient voiced understanding.)        Anesthesia Quick Evaluation

## 2020-01-31 NOTE — Op Note (Signed)
  01/31/2020  8:04 AM  PATIENT:  Dan [name]  54 y.o. male  PRE-OPERATIVE DIAGNOSIS:  Rectal nodule/ polyp  POST-OPERATIVE DIAGNOSIS:  Same  PROCEDURE:   1. Exam under anesthesia with transrectal excision of rectal polyp.  SURGEON:  Surgeon(s) and Role:    * Merilynn Haydu F, MD - Primary  FINDINGS:  34mm polyp on top hemorrhoid anterior midline. No evidence of fixed rectal masses.   ANESTHESIA: LMA  EBL: minimal    DICTATION:  Patient was explained  About the procedure in detail. Risks, benefits and possible complications and a consent was obtained. The patient taken to the operating room and placed in the lithotomy position.  Exam revealed a small  Polyp on anterior midline on top a hemorrhoid, no evidence of masses or other lesions. I elevated the polyp with an allis and excised it with a harmonic device. Good hemostasis observed.  Liposomal Marcaine was injected around the wound site. Needle and laparotomy counts were correct and there were no immediate complications  Jules Husbands, MD

## 2020-01-31 NOTE — Discharge Instructions (Addendum)
AMBULATORY SURGERY  DISCHARGE INSTRUCTIONS   1) The drugs that you were given will stay in your system until tomorrow so for the next 24 hours you should not:  A) Drive an automobile B) Make any legal decisions C) Drink any alcoholic beverage   2) You may resume regular meals tomorrow.  Today it is better to start with liquids and gradually work up to solid foods.  You may eat anything you prefer, but it is better to start with liquids, then soup and crackers, and gradually work up to solid foods.   3) Please notify your doctor immediately if you have any unusual bleeding, trouble breathing, redness and pain at the surgery site, drainage, fever, or pain not relieved by medication.    4) Additional Instructions:        Please contact your physician with any problems or Same Day Surgery at [phone], Monday through Friday 6 am to 4 pm, or [facility] [place] Main number at [phone].   Bupivacaine Liposomal Suspension for Injection What is this medicine? BUPIVACAINE LIPOSOMAL (bue PIV a kane LIP oh som al) is an anesthetic. It causes loss of feeling in the skin or other tissues. It is used to prevent and to treat pain from some procedures. This medicine may be used for other purposes; ask your health care provider or pharmacist if you have questions. COMMON BRAND NAME(S): EXPAREL What should I tell my health care provider before I take this medicine? They need to know if you have any of these conditions:  G6PD deficiency  heart disease  kidney disease  liver disease  low blood pressure  lung or breathing disease, like asthma  an unusual or allergic reaction to bupivacaine, other medicines, foods, dyes, or preservatives  pregnant or trying to get pregnant  breast-feeding How should I use this medicine? This medicine is for injection into the affected area. It is given by a health care professional in a hospital or clinic setting. Talk to your  pediatrician regarding the use of this medicine in children. Special care may be needed. Overdosage: If you think you have taken too much of this medicine contact a poison control center or emergency room at once. NOTE: This medicine is only for you. Do not share this medicine with others. What if I miss a dose? This does not apply. What may interact with this medicine? This medicine may interact with the following medications:  acetaminophen  certain antibiotics like dapsone, nitrofurantoin, aminosalicylic acid, sulfonamides  certain medicines for seizures like phenobarbital, phenytoin, valproic acid  chloroquine  cyclophosphamide  flutamide  hydroxyurea  ifosfamide  metoclopramide  nitric oxide  nitroglycerin  nitroprusside  nitrous oxide  other local anesthetics like lidocaine, pramoxine, tetracaine  primaquine  quinine  rasburicase  sulfasalazine This list may not describe all possible interactions. Give your health care provider a list of all the medicines, herbs, non-prescription drugs, or dietary supplements you use. Also tell them if you smoke, drink alcohol, or use illegal drugs. Some items may interact with your medicine. What should I watch for while using this medicine? Your condition will be monitored carefully while you are receiving this medicine. Be careful to avoid injury while the area is numb, and you are not aware of pain. What side effects may I notice from receiving this medicine? Side effects that you should report to your doctor or health care professional as soon as possible:  allergic reactions like skin rash, itching or hives, swelling of the face,   lips, or tongue  seizures  signs and symptoms of a dangerous change in heartbeat or heart rhythm like chest pain; dizziness; fast, irregular heartbeat; palpitations; feeling faint or lightheaded; falls; breathing problems  signs and symptoms of methemoglobinemia such as pale, gray, or blue  colored skin; headache; fast heartbeat; shortness of breath; feeling faint or lightheaded, falls; tiredness Side effects that usually do not require medical attention (report to your doctor or health care professional if they continue or are bothersome):  anxious  back pain  changes in taste  changes in vision  constipation  dizziness  fever  nausea, vomiting This list may not describe all possible side effects. Call your doctor for medical advice about side effects. You may report side effects to FDA at 1-800-FDA-1088. Where should I keep my medicine? This drug is given in a hospital or clinic and will not be stored at home. NOTE: This sheet is a summary. It may not cover all possible information. If you have questions about this medicine, talk to your doctor, pharmacist, or health care provider.  2020 Elsevier/Gold Standard (2019-05-24 10:48:23)   

## 2020-02-01 ENCOUNTER — Telehealth: Payer: Self-pay

## 2020-02-01 LAB — SURGICAL PATHOLOGY

## 2020-02-01 NOTE — Telephone Encounter (Signed)
-----   Message from Jules Husbands, MD sent at 02/01/2020 12:51 PM EDT ----- Please let the pt know that pathology came back c/w hemorrhoid, no cancer or polyp to be concerned about ----- Message ----- From: Interface, Lab In [place] Sent: 01/31/2020  10:34 AM EDT To: Jules Husbands, MD

## 2020-02-01 NOTE — Telephone Encounter (Signed)
Left detailed message for patient to give our office call back.

## 2020-02-02 ENCOUNTER — Telehealth: Payer: Self-pay

## 2020-02-02 NOTE — Telephone Encounter (Signed)
Spoke with patient notified of recent results. Patient verbalized understanding and has no further questions. Advise to give our office a call if he has any questions or concerns.

## 2020-02-02 NOTE — Telephone Encounter (Signed)
-----   Message from Jules Husbands, MD sent at 02/01/2020 12:51 PM EDT ----- Please let the pt know that pathology came back c/w hemorrhoid, no cancer or polyp to be concerned about ----- Message ----- From: Interface, Lab In [place] Sent: 01/31/2020  10:34 AM EDT To: Jules Husbands, MD

## 2020-02-15 ENCOUNTER — Telehealth (INDEPENDENT_AMBULATORY_CARE_PROVIDER_SITE_OTHER): Payer: BLUE CROSS/BLUE SHIELD | Admitting: Surgery

## 2020-02-15 ENCOUNTER — Other Ambulatory Visit: Payer: Self-pay

## 2020-02-15 DIAGNOSIS — Z09 Encounter for follow-up examination after completed treatment for conditions other than malignant neoplasm: Secondary | ICD-10-CM

## 2020-02-15 DIAGNOSIS — K6289 Other specified diseases of anus and rectum: Secondary | ICD-10-CM

## 2020-02-15 MED ORDER — KETOROLAC TROMETHAMINE 10 MG PO TABS: 10.0000 mg | ORAL_TABLET | Freq: Four times a day (QID) | ORAL | Status: AC | PRN

## 2020-02-15 NOTE — Progress Notes (Signed)
I called the patient and his cell phone.  He is doing well reports some soreness.  No fevers no chills no hematochezia.  Pathology discussed with him in detail tried to be hemorrhoidal tissue. Cussed with him about pain control he was out of Lortab.  I discussed that I did not feel comfortable doing another refill narcotics and I would encourage him to do NSAIDs.  I also offer to do gabapentin but he declined.  I will send a prescription for Toradol by mouth. He is currently working and doing otherwise well. Dr Georgeann Oppenheim office will contact him for next colonoscopy 3 years.

## 2020-05-08 ENCOUNTER — Emergency Department
Admission: EM | Admit: 2020-05-08 | Discharge: 2020-05-08 | Disposition: A | Discharge status: Home / Self Care | Payer: BLUE CROSS/BLUE SHIELD | Attending: Emergency Medicine | Admitting: Emergency Medicine

## 2020-05-08 ENCOUNTER — Encounter: Payer: Self-pay | Admitting: *Deleted

## 2020-05-08 ENCOUNTER — Other Ambulatory Visit: Payer: Self-pay

## 2020-05-08 DIAGNOSIS — R739 Hyperglycemia, unspecified: Secondary | ICD-10-CM | POA: Insufficient documentation

## 2020-05-08 DIAGNOSIS — Z5321 Procedure and treatment not carried out due to patient leaving prior to being seen by health care provider: Secondary | ICD-10-CM | POA: Insufficient documentation

## 2020-05-08 LAB — CBC
HCT: 35.2 % — ABNORMAL LOW (ref 39.0–52.0)
Hemoglobin: 12.5 g/dL — ABNORMAL LOW (ref 13.0–17.0)
MCH: 29.1 pg (ref 26.0–34.0)
MCHC: 35.5 g/dL (ref 30.0–36.0)
MCV: 81.9 fL (ref 80.0–100.0)
Platelets: 262 10*3/uL (ref 150–400)
RBC: 4.3 MIL/uL (ref 4.22–5.81)
RDW: 12.4 % (ref 11.5–15.5)
WBC: 5.5 10*3/uL (ref 4.0–10.5)
nRBC: 0 % (ref 0.0–0.2)

## 2020-05-08 LAB — BASIC METABOLIC PANEL
Anion gap: 11 (ref 5–15)
BUN: 26 mg/dL — ABNORMAL HIGH (ref 6–20)
CO2: 28 mmol/L (ref 22–32)
Calcium: 9.8 mg/dL (ref 8.9–10.3)
Chloride: 93 mmol/L — ABNORMAL LOW (ref 98–111)
Creatinine, Ser: 1.14 mg/dL (ref 0.61–1.24)
GFR calc Af Amer: >60 mL/min (ref >60)
GFR calc non Af Amer: >60 mL/min (ref >60)
Glucose, Bld: 592 mg/dL (ref 70–99)
Potassium: 4 mmol/L (ref 3.5–5.1)
Sodium: 132 mmol/L — ABNORMAL LOW (ref 135–145)

## 2020-05-08 LAB — URINALYSIS, COMPLETE (UACMP) WITH MICROSCOPIC
Bacteria, UA: NONE SEEN
Bilirubin Urine: NEGATIVE
Glucose, UA: >=500 mg/dL — AB
Hgb urine dipstick: NEGATIVE
Ketones, ur: NEGATIVE mg/dL
Leukocytes,Ua: NEGATIVE
Nitrite: NEGATIVE
Protein, ur: NEGATIVE mg/dL
Specific Gravity, Urine: 1.029 (ref 1.005–1.030)
Squamous Epithelial / HPF: NONE SEEN (ref 0–5)
pH: 6 (ref 5.0–8.0)

## 2020-05-08 NOTE — ED Triage Notes (Signed)
Pt ambulatory to triage.  Pt states his sugar was 595 at home today.  Pt started metformin 2 weeks ago.  Pt feels thirsty.  No n/v/d.  Pt alert  Speech clear.

## 2020-05-09 ENCOUNTER — Emergency Department
Admission: EM | Admit: 2020-05-09 | Discharge: 2020-05-09 | Disposition: A | Discharge status: Home / Self Care | Payer: BLUE CROSS/BLUE SHIELD | Attending: Emergency Medicine | Admitting: Emergency Medicine

## 2020-05-09 ENCOUNTER — Other Ambulatory Visit: Payer: Self-pay

## 2020-05-09 DIAGNOSIS — E1165 Type 2 diabetes mellitus with hyperglycemia: Secondary | ICD-10-CM | POA: Diagnosis not present

## 2020-05-09 DIAGNOSIS — Z79899 Other long term (current) drug therapy: Secondary | ICD-10-CM | POA: Diagnosis not present

## 2020-05-09 DIAGNOSIS — J45909 Unspecified asthma, uncomplicated: Secondary | ICD-10-CM | POA: Diagnosis not present

## 2020-05-09 DIAGNOSIS — R739 Hyperglycemia, unspecified: Secondary | ICD-10-CM

## 2020-05-09 DIAGNOSIS — Z87891 Personal history of nicotine dependence: Secondary | ICD-10-CM | POA: Diagnosis not present

## 2020-05-09 DIAGNOSIS — B370 Candidal stomatitis: Secondary | ICD-10-CM

## 2020-05-09 DIAGNOSIS — I10 Essential (primary) hypertension: Secondary | ICD-10-CM | POA: Diagnosis not present

## 2020-05-09 LAB — CBC
HCT: 38.3 % — ABNORMAL LOW (ref 39.0–52.0)
Hemoglobin: 13.5 g/dL (ref 13.0–17.0)
MCH: 28.4 pg (ref 26.0–34.0)
MCHC: 35.2 g/dL (ref 30.0–36.0)
MCV: 80.6 fL (ref 80.0–100.0)
Platelets: 249 10*3/uL (ref 150–400)
RBC: 4.75 MIL/uL (ref 4.22–5.81)
RDW: 12.6 % (ref 11.5–15.5)
WBC: 5.3 10*3/uL (ref 4.0–10.5)
nRBC: 0 % (ref 0.0–0.2)

## 2020-05-09 LAB — BASIC METABOLIC PANEL
Anion gap: 11 (ref 5–15)
BUN: 22 mg/dL — ABNORMAL HIGH (ref 6–20)
CO2: 29 mmol/L (ref 22–32)
Calcium: 9.6 mg/dL (ref 8.9–10.3)
Chloride: 95 mmol/L — ABNORMAL LOW (ref 98–111)
Creatinine, Ser: 1.07 mg/dL (ref 0.61–1.24)
GFR calc Af Amer: >60 mL/min (ref >60)
GFR calc non Af Amer: >60 mL/min (ref >60)
Glucose, Bld: 449 mg/dL — ABNORMAL HIGH (ref 70–99)
Potassium: 4 mmol/L (ref 3.5–5.1)
Sodium: 135 mmol/L (ref 135–145)

## 2020-05-09 LAB — URINALYSIS, COMPLETE (UACMP) WITH MICROSCOPIC
Bacteria, UA: NONE SEEN
Bilirubin Urine: NEGATIVE
Glucose, UA: >=500 mg/dL — AB
Hgb urine dipstick: NEGATIVE
Ketones, ur: 5 mg/dL — AB
Leukocytes,Ua: NEGATIVE
Nitrite: NEGATIVE
Protein, ur: NEGATIVE mg/dL
Specific Gravity, Urine: 1.033 — ABNORMAL HIGH (ref 1.005–1.030)
pH: 6 (ref 5.0–8.0)

## 2020-05-09 LAB — GLUCOSE, CAPILLARY
Glucose-Capillary: 446 mg/dL — ABNORMAL HIGH (ref 70–99)
Glucose-Capillary: 439 mg/dL — ABNORMAL HIGH (ref 70–99)

## 2020-05-09 MED ORDER — NYSTATIN 100000 UNIT/ML MT SUSP
5.0000 mL | Freq: Four times a day (QID) | OROMUCOSAL | 0 refills | Status: AC
Start: 2020-05-09 — End: 2020-05-16

## 2020-05-09 MED ORDER — LACTATED RINGERS IV BOLUS
1000.0000 mL | Freq: Once | INTRAVENOUS | Status: AC
  Administered 2020-05-09: 1000 mL via INTRAVENOUS

## 2020-05-09 MED ORDER — NYSTATIN 100000 UNIT/ML MT SUSP
5.0000 mL | Freq: Once | OROMUCOSAL | Status: DC
  Filled 2020-05-09: qty 5

## 2020-05-09 NOTE — ED Triage Notes (Signed)
Pt states he was here last night but had to leave and take care of his diabled wife at home, states he was just started back on metformin 2 weeks ago and has been having blurred vision with increased thirst and urination and felt like his glucose was elevated. It was over 500 yesterday

## 2020-05-09 NOTE — Discharge Instructions (Signed)
You were seen in the ED because of your high blood sugar levels.  Thankfully, there is no evidence of more severe damage from your high sugars.  You are being discharged with a prescription for nystatin mouthwash, which will be waiting for you at the [facility] on [facility].  This is to treat the thrush within your mouth, and should be used 4 times a day for the next 7 days.  Swish and then swallow 5 mL of fluid for each dose.  Attached are a couple handouts regarding high blood sugar and diabetes nutrition/diet recommendations.  Please follow-up with your primary care physician in the next 1-2 weeks to discuss your blood sugar readings and diabetes medications.  If you develop any worsening symptoms, especially high sugar levels in the setting of throwing up or acute illness, please return to the ED.

## 2020-05-09 NOTE — ED Provider Notes (Signed)
[facility] Emergency Department Provider Note ____________________________________________   First MD Initiated Contact with Patient 05/09/20 1200     (approximate)  I have reviewed the triage vital signs and the nursing notes.  HISTORY  Chief Complaint Hyperglycemia   HPI Dan FINNAN is a 54 y.o. malewho presents to the ED for evaluation of hyperglycemia.   Chart review indicates history of HTN on multiple oral agents, previous borderline DM with A1c of 6.1% on 02/21/2020.  Patient reports being restarted on his Metformin 2 weeks ago by his PCP.  Patient reports about 2 weeks of increased thirst, increased urination and intermittent and generalized blurry vision.  He reports the symptoms have been consistent for 2 weeks without significant change.  Reports going to a pharmacy and buying OTC glucometer, noting blood glucose levels 400s-500s over the past 2 days.  After a reading in the 590s today, patient presents to the ED for evaluation because "that is just too high."  Here in the ED, patient reports his vision "seems okay."  His only complaint is thirst.  Patient denies any pain anywhere, trauma, falls, abdominal pain, headache, dysuria, vomiting.  He does report associated polyuria and polydipsia.  He has multiple questions about diabetic diet that we discussed initially.  He asks about fruit juices, raw fruits and sodas.  We discussed minimizing sugar intake and simple carbohydrate intake.    Past Medical History:  Diagnosis Date  . Acid reflux   . Asthma   . Diabetes mellitus without complication ([place])   . History of kidney stones   . Hypertension   . Kidney stones     Patient Active Problem List   Diagnosis Date Noted  . Flank pain 11/14/2019  . Asthma   . Acid reflux   . Type 2 diabetes mellitus without complication ([place]) 75/64/3329  . HTN (hypertension) 08/17/2013    Past Surgical History:  Procedure Laterality Date  . COLONOSCOPY  WITH PROPOFOL N/A 12/02/2019   Procedure: COLONOSCOPY WITH PROPOFOL;  Surgeon: [name], MD;  Location: [facility] ENDOSCOPY;  Service: Gastroenterology;  Laterality: N/A;  . FRACTURE SURGERY Left at age 44   pelvic fracture  . HEMORRHOID SURGERY N/A 01/31/2020   Procedure: HEMORRHOIDECTOMY;  Surgeon: Jules Husbands, MD;  Location: [facility] ORS;  Service: General;  Laterality: N/A;  . pelvis fractur    . ROTATOR CUFF REPAIR      Prior to Admission medications   Medication Sig Start Date End Date Taking? Authorizing Provider  albuterol (VENTOLIN HFA) 108 (90 Base) MCG/ACT inhaler Inhale 2 puffs into the lungs every 6 (six) hours as needed for wheezing or shortness of breath. 09/08/19   Volney American, PA-C  amLODipine (NORVASC) 10 MG tablet TAKE 1 TABLET(10 MG) BY MOUTH DAILY Patient taking differently: Take 10 mg by mouth daily.  12/12/19   Volney American, PA-C  etodolac (LODINE) 200 MG capsule Take 1 capsule (200 mg total) by mouth every 6 (six) hours as needed for moderate pain. Patient not taking: Reported on 01/11/2020 11/14/19   Eulogio Bear, NP  fluticasone [facility]) 50 MCG/ACT nasal spray Place 1 spray into both nostrils daily as needed for allergies or rhinitis.    [provider]  hydrochlorothiazide (HYDRODIURIL) 12.5 MG tablet TAKE 1 TABLET(12.5 MG) BY MOUTH DAILY Patient taking differently: Take 12.5 mg by mouth daily.  11/17/19   Volney American, PA-C  HYDROcodone-acetaminophen (NORCO/VICODIN) 5-325 MG tablet Take 1 tablet by mouth every 6 (six)  hours as needed for moderate pain. 01/31/20   Pabon, Diego F, MD  losartan (COZAAR) 100 MG tablet TAKE 1 TABLET(100 MG) BY MOUTH DAILY Patient taking differently: Take 100 mg by mouth daily.  12/12/19   Volney American, PA-C  nystatin (MYCOSTATIN) 100000 UNIT/ML suspension Take 5 mLs (500,000 Units total) by mouth 4 (four) times daily for 7 days. 05/09/20 05/16/20  Vladimir Crofts, MD  omeprazole (PRILOSEC OTC) 20 MG  tablet Take 20 mg by mouth daily.    [provider]  omeprazole (PRILOSEC) 40 MG capsule Take 1 capsule (40 mg total) by mouth daily. 09/08/19   Volney American, PA-C  ondansetron (ZOFRAN ODT) 4 MG disintegrating tablet Take 1 tablet (4 mg total) by mouth every 8 (eight) hours as needed for nausea or vomiting. Patient not taking: Reported on 01/11/2020 11/14/19   Eulogio Bear, NP  tamsulosin (FLOMAX) 0.4 MG CAPS capsule Take 1 capsule (0.4 mg total) by mouth at bedtime. Patient not taking: Reported on 01/11/2020 11/14/19   Eulogio Bear, NP    Allergies Cherry, Ibuprofen, Other, and Tramadol  Family History  Problem Relation Age of Onset  . Cancer Sister   . Diabetes Brother   . Hypertension Brother   . Heart attack Father   . Asthma Son     Social History Social History   Tobacco Use  . Smoking status: Former Smoker    Packs/day: 0.50    Years: 25.00    Pack years: 12.50    Types: Cigarettes  . Smokeless tobacco: Never Used  Vaping Use  . Vaping Use: Never used  Substance Use Topics  . Alcohol use: Yes    Alcohol/week: 6.0 standard drinks    Types: 6 Cans of beer per week    Comment: weekends only  . Drug use: No    Review of Systems  Constitutional: No fever/chills Eyes: Positive for intermittent diffuse blurry vision. ENT: No sore throat. Cardiovascular: Denies chest pain. Respiratory: Denies shortness of breath. Gastrointestinal: No abdominal pain.  No nausea, no vomiting.  No diarrhea.  No constipation.  Positive for polydipsia Genitourinary: Negative for dysuria.  Positive for polyuria Musculoskeletal: Negative for back pain. Skin: Negative for rash. Neurological: Negative for headaches, focal weakness or numbness.   ____________________________________________   PHYSICAL EXAM:  VITAL SIGNS: Vitals:   05/09/20 0919 05/09/20 1327  BP: 129/86 132/81  Pulse: 94 62  Resp: 18   Temp: 98.5 F (36.9 C) 98.5 F (36.9 C)  SpO2:  98% 98%      Constitutional: Alert and oriented. Well appearing and in no acute distress. Eyes: Conjunctivae are normal. PERRL. EOMI. Head: Atraumatic. Nose: No congestion/rhinnorhea. Mouth/Throat: Mucous membranes are moist.  Oropharynx non-erythematous. White scrappable lesions to his tongue and inside of the mouth.  Uvula is midline and no posterior oropharyngeal lesions. Neck: No stridor. No cervical spine tenderness to palpation. Cardiovascular: Normal rate, regular rhythm. Grossly normal heart sounds.  Good peripheral circulation. Respiratory: Normal respiratory effort.  No retractions. Lungs CTAB. Gastrointestinal: Soft , nondistended, nontender to palpation. No abdominal bruits. No CVA tenderness. Musculoskeletal: No lower extremity tenderness nor edema.  No joint effusions. No signs of acute trauma. Neurologic:  Normal speech and language. No gross focal neurologic deficits are appreciated. No gait instability noted. Cranial nerves II through XII intact 5/5 strength and sensation in all 4 extremities Ambulatory with a normal gait independently. Skin:  Skin is warm, dry and intact. No rash noted. Psychiatric: Mood and affect  are normal. Speech and behavior are normal.  ____________________________________________   LABS (all labs ordered are listed, but only abnormal results are displayed)  Labs Reviewed  GLUCOSE, CAPILLARY - Abnormal; Notable for the following components:      Result Value   Glucose-Capillary 446 (*)    All other components within normal limits  BASIC METABOLIC PANEL - Abnormal; Notable for the following components:   Chloride 95 (*)    Glucose, Bld 449 (*)    BUN 22 (*)    All other components within normal limits  CBC - Abnormal; Notable for the following components:   HCT 38.3 (*)    All other components within normal limits  URINALYSIS, COMPLETE (UACMP) WITH MICROSCOPIC - Abnormal; Notable for the following components:   Color, Urine YELLOW (*)     APPearance CLEAR (*)    Specific Gravity, Urine 1.033 (*)    Glucose, UA >=500 (*)    Ketones, ur 5 (*)    All other components within normal limits  GLUCOSE, CAPILLARY - Abnormal; Notable for the following components:   Glucose-Capillary 439 (*)    All other components within normal limits  CBG MONITORING, ED  CBG MONITORING, ED  CBG MONITORING, ED   ____________________________________________   PROCEDURES and INTERVENTIONS  Procedure(s) performed (including Critical Care):  Procedures  Medications  lactated ringers bolus 1,000 mL (0 mLs Intravenous Stopped 05/09/20 1340)    ____________________________________________   MDM / ED COURSE  54 year old diabetic presents with hyperglycemia and evidence of thrush, without evidence of DKA or HHS, amenable to outpatient management.  Normal vital signs on room air.  Exam with a well-appearing patient without distress.  He does have scrappable white plaque lesions on his tongue inside of his mouth most consistent with thrush, for which he received a prescription for 1 week of oral nystatin.  Blood work demonstrates hyperglycemia to the 400s without acidosis.  No other significant metabolic disturbances.  Provided patient p.o. water and liter of LR with resolution of his polyuria, polydipsia and blurring vision.  Recheck of capillary blood glucose shows downtrending glucose.  Advised patient to continue taking his diabetic medications and to follow-up with his PCP within the next 5 days for recheck and discuss escalation of his diabetic medications.  We discussed outpatient management of his condition, and discussed return precautions for the ED.  Patient medically stable for discharge home.  Clinical Course as of May 09 1532  Wed May 09, 2020  1321 Reassessed.  Patient reports resolution of symptoms.  He still has not received his nystatin mouthwash from pharmacy yet, but he is eager to go home.  We discussed picking up his nystatin from  the local pharmacy and starting the prescription today.  He is agreeable.  I disconnect him from the IV and he ambulates independently to the restroom to void.   [DS]    Clinical Course User Index [DS] Vladimir Crofts, MD   ____________________________________________   FINAL CLINICAL IMPRESSION(S) / ED DIAGNOSES  Final diagnoses:  Hyperglycemia  Thrush     ED Discharge Orders         Ordered    nystatin (MYCOSTATIN) 100000 UNIT/ML suspension  4 times daily        05/09/20 1241           Koji Niehoff   Note:  This document was prepared using Systems analyst and may include unintentional dictation errors.   Vladimir Crofts, MD 05/09/20 1535

## 2020-05-11 ENCOUNTER — Encounter: Payer: Self-pay | Admitting: Nurse Practitioner

## 2020-11-19 ENCOUNTER — Other Ambulatory Visit: Payer: Self-pay

## 2020-11-19 ENCOUNTER — Telehealth: Payer: Self-pay | Admitting: Internal Medicine

## 2020-11-19 ENCOUNTER — Encounter: Payer: Self-pay | Admitting: Emergency Medicine

## 2020-11-19 ENCOUNTER — Emergency Department: Payer: BLUE CROSS/BLUE SHIELD

## 2020-11-19 ENCOUNTER — Inpatient Hospital Stay
Admission: EM | Admit: 2020-11-19 | Discharge: 2020-11-20 | DRG: 436 | Disposition: A | Discharge status: Home / Self Care | Payer: BLUE CROSS/BLUE SHIELD | Attending: Internal Medicine | Admitting: Internal Medicine

## 2020-11-19 DIAGNOSIS — R160 Hepatomegaly, not elsewhere classified: Secondary | ICD-10-CM

## 2020-11-19 DIAGNOSIS — Z803 Family history of malignant neoplasm of breast: Secondary | ICD-10-CM | POA: Diagnosis not present

## 2020-11-19 DIAGNOSIS — Z20822 Contact with and (suspected) exposure to covid-19: Secondary | ICD-10-CM | POA: Diagnosis present

## 2020-11-19 DIAGNOSIS — E119 Type 2 diabetes mellitus without complications: Secondary | ICD-10-CM | POA: Diagnosis present

## 2020-11-19 DIAGNOSIS — Z91018 Allergy to other foods: Secondary | ICD-10-CM

## 2020-11-19 DIAGNOSIS — Z7984 Long term (current) use of oral hypoglycemic drugs: Secondary | ICD-10-CM

## 2020-11-19 DIAGNOSIS — Z888 Allergy status to other drugs, medicaments and biological substances status: Secondary | ICD-10-CM

## 2020-11-19 DIAGNOSIS — I10 Essential (primary) hypertension: Secondary | ICD-10-CM | POA: Diagnosis present

## 2020-11-19 DIAGNOSIS — K8689 Other specified diseases of pancreas: Secondary | ICD-10-CM | POA: Diagnosis present

## 2020-11-19 DIAGNOSIS — C259 Malignant neoplasm of pancreas, unspecified: Principal | ICD-10-CM | POA: Diagnosis present

## 2020-11-19 DIAGNOSIS — Z885 Allergy status to narcotic agent status: Secondary | ICD-10-CM

## 2020-11-19 DIAGNOSIS — Z87442 Personal history of urinary calculi: Secondary | ICD-10-CM | POA: Diagnosis not present

## 2020-11-19 DIAGNOSIS — J45909 Unspecified asthma, uncomplicated: Secondary | ICD-10-CM | POA: Diagnosis present

## 2020-11-19 DIAGNOSIS — J4520 Mild intermittent asthma, uncomplicated: Secondary | ICD-10-CM | POA: Diagnosis not present

## 2020-11-19 DIAGNOSIS — Z833 Family history of diabetes mellitus: Secondary | ICD-10-CM

## 2020-11-19 DIAGNOSIS — E876 Hypokalemia: Secondary | ICD-10-CM | POA: Diagnosis present

## 2020-11-19 DIAGNOSIS — Z8249 Family history of ischemic heart disease and other diseases of the circulatory system: Secondary | ICD-10-CM

## 2020-11-19 DIAGNOSIS — Z825 Family history of asthma and other chronic lower respiratory diseases: Secondary | ICD-10-CM | POA: Diagnosis not present

## 2020-11-19 DIAGNOSIS — Z79899 Other long term (current) drug therapy: Secondary | ICD-10-CM | POA: Diagnosis not present

## 2020-11-19 DIAGNOSIS — Z87891 Personal history of nicotine dependence: Secondary | ICD-10-CM

## 2020-11-19 DIAGNOSIS — K219 Gastro-esophageal reflux disease without esophagitis: Secondary | ICD-10-CM | POA: Diagnosis present

## 2020-11-19 DIAGNOSIS — C787 Secondary malignant neoplasm of liver and intrahepatic bile duct: Secondary | ICD-10-CM | POA: Diagnosis present

## 2020-11-19 LAB — COMPREHENSIVE METABOLIC PANEL
ALT: 44 U/L (ref 0–44)
AST: 51 U/L — ABNORMAL HIGH (ref 15–41)
Albumin: 4 g/dL (ref 3.5–5.0)
Alkaline Phosphatase: 89 U/L (ref 38–126)
Anion gap: 12 (ref 5–15)
BUN: 18 mg/dL (ref 6–20)
CO2: 24 mmol/L (ref 22–32)
Calcium: 9.2 mg/dL (ref 8.9–10.3)
Chloride: 100 mmol/L (ref 98–111)
Creatinine, Ser: 1.1 mg/dL (ref 0.61–1.24)
GFR, Estimated: >60 mL/min (ref >60)
Glucose, Bld: 172 mg/dL — ABNORMAL HIGH (ref 70–99)
Potassium: 3.1 mmol/L — ABNORMAL LOW (ref 3.5–5.1)
Sodium: 136 mmol/L (ref 135–145)
Total Bilirubin: 0.9 mg/dL (ref 0.3–1.2)
Total Protein: 8.1 g/dL (ref 6.5–8.1)

## 2020-11-19 LAB — URINALYSIS, COMPLETE (UACMP) WITH MICROSCOPIC
Bacteria, UA: NONE SEEN
Bilirubin Urine: NEGATIVE
Glucose, UA: >=500 mg/dL — AB
Hgb urine dipstick: NEGATIVE
Ketones, ur: NEGATIVE mg/dL
Leukocytes,Ua: NEGATIVE
Nitrite: NEGATIVE
Protein, ur: NEGATIVE mg/dL
Specific Gravity, Urine: 1.029 (ref 1.005–1.030)
pH: 6 (ref 5.0–8.0)

## 2020-11-19 LAB — GLUCOSE, CAPILLARY
Glucose-Capillary: 128 mg/dL — ABNORMAL HIGH (ref 70–99)
Glucose-Capillary: 149 mg/dL — ABNORMAL HIGH (ref 70–99)

## 2020-11-19 LAB — PROTIME-INR
INR: 1.1 (ref 0.8–1.2)
Prothrombin Time: 13.5 seconds (ref 11.4–15.2)

## 2020-11-19 LAB — LIPASE, BLOOD: Lipase: 41 U/L (ref 11–51)

## 2020-11-19 LAB — CBC
HCT: 34 % — ABNORMAL LOW (ref 39.0–52.0)
Hemoglobin: 11.6 g/dL — ABNORMAL LOW (ref 13.0–17.0)
MCH: 27.8 pg (ref 26.0–34.0)
MCHC: 34.1 g/dL (ref 30.0–36.0)
MCV: 81.5 fL (ref 80.0–100.0)
Platelets: 213 10*3/uL (ref 150–400)
RBC: 4.17 MIL/uL — ABNORMAL LOW (ref 4.22–5.81)
RDW: 13.6 % (ref 11.5–15.5)
WBC: 5.8 10*3/uL (ref 4.0–10.5)
nRBC: 0 % (ref 0.0–0.2)

## 2020-11-19 LAB — MAGNESIUM: Magnesium: 1.9 mg/dL (ref 1.7–2.4)

## 2020-11-19 LAB — HIV ANTIBODY (ROUTINE TESTING W REFLEX): HIV Screen 4th Generation wRfx: NONREACTIVE

## 2020-11-19 LAB — APTT: aPTT: 24 seconds (ref 24–36)

## 2020-11-19 MED ORDER — MORPHINE SULFATE (PF) 2 MG/ML IV SOLN
2.0000 mg | INTRAVENOUS | Status: DC | PRN
Start: 2020-11-19 — End: 2020-11-20
  Administered 2020-11-19 – 2020-11-20 (×3): 2 mg via INTRAVENOUS
  Filled 2020-11-19 (×3): qty 1

## 2020-11-19 MED ORDER — ALBUTEROL SULFATE (2.5 MG/3ML) 0.083% IN NEBU: 2.5000 mg | INHALATION_SOLUTION | RESPIRATORY_TRACT | Status: DC | PRN

## 2020-11-19 MED ORDER — POTASSIUM CHLORIDE CRYS ER 20 MEQ PO TBCR
40.0000 meq | EXTENDED_RELEASE_TABLET | Freq: Once | ORAL | Status: AC
  Administered 2020-11-19: 40 meq via ORAL
  Filled 2020-11-19: qty 2

## 2020-11-19 MED ORDER — ONDANSETRON HCL 4 MG/2ML IJ SOLN: 4.0000 mg | Freq: Three times a day (TID) | INTRAMUSCULAR | Status: DC | PRN

## 2020-11-19 MED ORDER — HEPARIN SODIUM (PORCINE) 5000 UNIT/ML IJ SOLN: 5000.0000 [IU] | Freq: Three times a day (TID) | INTRAMUSCULAR | Status: DC

## 2020-11-19 MED ORDER — AMLODIPINE BESYLATE 10 MG PO TABS
10.0000 mg | ORAL_TABLET | Freq: Every day | ORAL | Status: DC
  Administered 2020-11-19 – 2020-11-20 (×2): 10 mg via ORAL
  Filled 2020-11-19 (×2): qty 1

## 2020-11-19 MED ORDER — LOSARTAN POTASSIUM 50 MG PO TABS
100.0000 mg | ORAL_TABLET | Freq: Every day | ORAL | Status: DC
  Administered 2020-11-19 – 2020-11-20 (×2): 100 mg via ORAL
  Filled 2020-11-19 (×2): qty 2

## 2020-11-19 MED ORDER — OXYCODONE-ACETAMINOPHEN 5-325 MG PO TABS
1.0000 | ORAL_TABLET | ORAL | Status: DC | PRN
  Administered 2020-11-20: 15:00:00 1 via ORAL
  Filled 2020-11-19: qty 1

## 2020-11-19 MED ORDER — INSULIN ASPART 100 UNIT/ML ~~LOC~~ SOLN: 0.0000 [IU] | Freq: Three times a day (TID) | SUBCUTANEOUS | Status: DC

## 2020-11-19 MED ORDER — HYDRALAZINE HCL 20 MG/ML IJ SOLN: 5.0000 mg | INTRAMUSCULAR | Status: DC | PRN

## 2020-11-19 MED ORDER — INSULIN ASPART 100 UNIT/ML ~~LOC~~ SOLN: 0.0000 [IU] | Freq: Every day | SUBCUTANEOUS | Status: DC

## 2020-11-19 MED ORDER — PANTOPRAZOLE SODIUM 40 MG PO TBEC: 40.0000 mg | DELAYED_RELEASE_TABLET | Freq: Every day | ORAL | Status: DC

## 2020-11-19 MED ORDER — ACETAMINOPHEN 325 MG PO TABS: 650.0000 mg | ORAL_TABLET | Freq: Four times a day (QID) | ORAL | Status: DC | PRN

## 2020-11-19 MED ORDER — DM-GUAIFENESIN ER 30-600 MG PO TB12: 1.0000 | ORAL_TABLET | Freq: Two times a day (BID) | ORAL | Status: DC | PRN

## 2020-11-19 MED ORDER — MORPHINE SULFATE (PF) 4 MG/ML IV SOLN
4.0000 mg | Freq: Once | INTRAVENOUS | Status: AC
  Administered 2020-11-19: 4 mg via INTRAVENOUS
  Filled 2020-11-19: qty 1

## 2020-11-19 MED ORDER — ONDANSETRON HCL 4 MG/2ML IJ SOLN
4.0000 mg | Freq: Once | INTRAMUSCULAR | Status: AC
  Administered 2020-11-19: 4 mg via INTRAVENOUS
  Filled 2020-11-19: qty 2

## 2020-11-19 MED ORDER — CHLORTHALIDONE 25 MG PO TABS
25.0000 mg | ORAL_TABLET | Freq: Every morning | ORAL | Status: DC
  Administered 2020-11-19 – 2020-11-20 (×2): 25 mg via ORAL
  Filled 2020-11-19 (×2): qty 1

## 2020-11-19 MED ORDER — LACTATED RINGERS IV BOLUS
1000.0000 mL | Freq: Once | INTRAVENOUS | Status: AC
  Administered 2020-11-19: 1000 mL via INTRAVENOUS

## 2020-11-19 MED ORDER — IOHEXOL 300 MG/ML  SOLN
125.0000 mL | Freq: Once | INTRAMUSCULAR | Status: AC | PRN
  Administered 2020-11-19: 125 mL via INTRAVENOUS

## 2020-11-19 MED ORDER — ALBUTEROL SULFATE HFA 108 (90 BASE) MCG/ACT IN AERS
2.0000 | INHALATION_SPRAY | RESPIRATORY_TRACT | Status: DC | PRN
  Filled 2020-11-19: qty 6.7

## 2020-11-19 NOTE — ED Triage Notes (Signed)
C/O mid to lower abdominal pain since Friday, pain worse when taking a deep breath.  Also urine is dark since Saturday.  Reports recent change to diabetic medication.  AAOx3.  Skin warm and dry. NAD

## 2020-11-19 NOTE — Consult Note (Signed)
[place] NOTE  Patient Care Team: [facility] as PCP - General  CHIEF COMPLAINTS/PURPOSE OF CONSULTATION: Pancreatic mass/liver mass  HISTORY OF PRESENTING ILLNESS:  Dan [name] 55 y.o.  male history of diabetes is currently admitted to hospital for pancreatic mass/liver lesion noted on CT imaging.  Patient noted to have worsening abdominal pain nausea with vomiting over the last week or so.  Denies any weight loss.  Denies any swelling in the legs.  Denies any shortness of breath or cough.  Patient has been recently switched over to [facility] because of concerns of abdominal discomfort from Metformin.   In the emergency room CT scan shows-approximately a centimeter pancreatic mass; adrenal lesion; also approximately 6 cm liver lesion.  Oncology has been consulted for further evaluation recommendations.  Review of Systems  Constitutional: Positive for malaise/fatigue. Negative for chills, diaphoresis, fever and weight loss.  HENT: Negative for nosebleeds and sore throat.   Eyes: Negative for double vision.  Respiratory: Negative for cough, hemoptysis, sputum production, shortness of breath and wheezing.   Cardiovascular: Negative for chest pain, palpitations, orthopnea and leg swelling.  Gastrointestinal: Positive for abdominal pain, nausea and vomiting. Negative for blood in stool, constipation, diarrhea, heartburn and melena.  Genitourinary: Negative for dysuria, frequency and urgency.  Musculoskeletal: Negative for back pain and joint pain.  Skin: Negative.  Negative for itching and rash.  Neurological: Negative for dizziness, tingling, focal weakness, weakness and headaches.  Endo/Heme/Allergies: Does not bruise/bleed easily.  Psychiatric/Behavioral: Negative for depression. The patient is not nervous/anxious and does not have insomnia.      MEDICAL HISTORY:  Past Medical History:  Diagnosis Date  . Acid reflux   . Asthma   . Diabetes mellitus  without complication ([place])   . History of kidney stones   . Hypertension   . Kidney stones     SURGICAL HISTORY: Past Surgical History:  Procedure Laterality Date  . COLONOSCOPY WITH PROPOFOL N/A 12/02/2019   Procedure: COLONOSCOPY WITH PROPOFOL;  Surgeon: [name], MD;  Location: [facility] ENDOSCOPY;  Service: Gastroenterology;  Laterality: N/A;  . FRACTURE SURGERY Left at age 42   pelvic fracture  . HEMORRHOID SURGERY N/A 01/31/2020   Procedure: HEMORRHOIDECTOMY;  Surgeon: Jules Husbands, MD;  Location: [facility] ORS;  Service: General;  Laterality: N/A;  . pelvis fractur    . ROTATOR CUFF REPAIR      SOCIAL HISTORY: Social History   Socioeconomic History  . Marital status: Married    Spouse name: Not on file  . Number of children: Not on file  . Years of education: Not on file  . Highest education level: Not on file  Occupational History  . Not on file  Tobacco Use  . Smoking status: Former Smoker    Packs/day: 0.50    Years: 25.00    Pack years: 12.50    Types: Cigarettes  . Smokeless tobacco: Never Used  Vaping Use  . Vaping Use: Never used  Substance and Sexual Activity  . Alcohol use: Yes    Alcohol/week: 6.0 standard drinks    Types: 6 Cans of beer per week    Comment: weekends only  . Drug use: No  . Sexual activity: Yes  Other Topics Concern  . Not on file  Social History Narrative   Works at [employer]. In [place]. Quit smoking 2021 June. Social. Alcohol.    Social Determinants of Health   Financial Resource Strain: Not on file  Food Insecurity: Not on  file  Transportation Needs: Not on file  Physical Activity: Not on file  Stress: Not on file  Social Connections: Not on file  Intimate Partner Violence: Not on file    FAMILY HISTORY: Family History  Problem Relation Age of Onset  . Cancer Sister        2 sisters 77 and 73- both died of breast cancer  . Diabetes Brother   . Hypertension Brother   . Heart attack Father   . Asthma Son     ALLERGIES:  is  allergic to cherry, ibuprofen, other, and tramadol.  MEDICATIONS:  Current Facility-Administered Medications  Medication Dose Route Frequency Provider Last Rate Last Admin  . acetaminophen (TYLENOL) tablet 650 mg  650 mg Oral Q6H PRN Ivor Costa, MD      . albuterol (PROVENTIL) (2.5 MG/3ML) 0.083% nebulizer solution 2.5 mg  2.5 mg Nebulization Q4H PRN Shanlever, Pierce Crane, RPH      . amLODipine (NORVASC) tablet 10 mg  10 mg Oral Daily Ivor Costa, MD   10 mg at 11/19/20 1422  . chlorthalidone (HYGROTON) tablet 25 mg  25 mg Oral q morning Ivor Costa, MD   25 mg at 11/19/20 1422  . dextromethorphan-guaiFENesin (MUCINEX DM) 30-600 MG per 12 hr tablet 1 tablet  1 tablet Oral BID PRN Ivor Costa, MD      . heparin injection 5,000 Units  5,000 Units Subcutaneous Q8H Ivor Costa, MD      . hydrALAZINE (APRESOLINE) injection 5 mg  5 mg Intravenous Q2H PRN Ivor Costa, MD      . insulin aspart (novoLOG) injection 0-5 Units  0-5 Units Subcutaneous QHS Ivor Costa, MD      . insulin aspart (novoLOG) injection 0-9 Units  0-9 Units Subcutaneous TID WC Ivor Costa, MD      . losartan (COZAAR) tablet 100 mg  100 mg Oral Daily Ivor Costa, MD   100 mg at 11/19/20 1422  . morphine 2 MG/ML injection 2 mg  2 mg Intravenous Q3H PRN Ivor Costa, MD   2 mg at 11/19/20 1427  . ondansetron (ZOFRAN) injection 4 mg  4 mg Intravenous Q8H PRN Ivor Costa, MD      . oxyCODONE-acetaminophen (PERCOCET/ROXICET) 5-325 MG per tablet 1 tablet  1 tablet Oral Q4H PRN Ivor Costa, MD      . pantoprazole (PROTONIX) EC tablet 40 mg  40 mg Oral Daily Ivor Costa, MD          .  PHYSICAL EXAMINATION:  Vitals:   11/19/20 1116 11/19/20 1235  BP:  130/83  Pulse: 60 68  Resp: 11   Temp: 98.9 F (37.2 C)   SpO2: 96% 98%   Filed Weights   11/19/20 0743  Weight: 222 lb (100.7 kg)    Physical Exam HENT:     Head: Normocephalic and atraumatic.     Mouth/Throat:     Pharynx: No oropharyngeal exudate.  Eyes:     Pupils: Pupils are  equal, round, and reactive to light.  Cardiovascular:     Rate and Rhythm: Normal rate and regular rhythm.  Pulmonary:     Effort: Pulmonary effort is normal. No respiratory distress.     Breath sounds: Normal breath sounds. No wheezing.  Abdominal:     General: Bowel sounds are normal. There is no distension.     Palpations: Abdomen is soft. There is no mass.     Tenderness: There is no abdominal tenderness. There is no guarding or rebound.  Musculoskeletal:        General: No tenderness. Normal range of motion.     Cervical back: Normal range of motion and neck supple.  Skin:    General: Skin is warm.  Neurological:     Mental Status: He is alert and oriented to person, place, and time.  Psychiatric:        Mood and Affect: Affect normal.      LABORATORY DATA:  I have reviewed the data as listed Lab Results  Component Value Date   WBC 5.8 11/19/2020   HGB 11.6 (L) 11/19/2020   HCT 34.0 (L) 11/19/2020   MCV 81.5 11/19/2020   PLT 213 11/19/2020   Recent Labs    01/27/20 1012 05/08/20 1955 05/09/20 0921 11/19/20 0745  NA 140 132* 135 136  K 3.9 4.0 4.0 3.1*  CL 105 93* 95* 100  CO2 27 28 29 24   GLUCOSE 106* 592* 449* 172*  BUN 13 26* 22* 18  CREATININE 0.90 1.14 1.07 1.10  CALCIUM 9.1 9.8 9.6 9.2  GFRNONAA >60 >60 >60 >60  GFRAA >60 >60 >60  --   PROT  --   --   --  8.1  ALBUMIN  --   --   --  4.0  AST  --   --   --  51*  ALT  --   --   --  44  ALKPHOS  --   --   --  89  BILITOT  --   --   --  0.9    RADIOGRAPHIC STUDIES: I have personally reviewed the radiological images as listed and agreed with the findings in the report. CT Abdomen Pelvis W Contrast  Result Date: 11/19/2020 CLINICAL DATA:  Mid to lower abdominal pain for several days, worse with inspiration. EXAM: CT ABDOMEN AND PELVIS WITH CONTRAST TECHNIQUE: Multidetector CT imaging of the abdomen and pelvis was performed using the standard protocol following bolus administration of intravenous  contrast. CONTRAST:  123mL OMNIPAQUE IOHEXOL 300 MG/ML  SOLN COMPARISON:  05/18/2013 CT abdomen/pelvis. FINDINGS: Lower chest: No significant pulmonary nodules or acute consolidative airspace disease. Hepatobiliary: Hypodense 6.3 x 4.9 cm peripheral segment 7 right liver mass (series 2/image 19), new. No additional liver masses. Normal gallbladder with no radiopaque cholelithiasis. No biliary ductal dilatation. Pancreas: Heterogeneous hypodense 8.2 x 8.1 cm pancreatic tail mass (series 2/image 24), new. No pancreatic duct dilation. Spleen: Normal size. No mass. Adrenals/Urinary Tract: Left adrenal 1.9 cm nodule with density 55 HU, new. No right adrenal nodules. Normal kidneys with no hydronephrosis and no renal mass. Normal bladder. Stomach/Bowel: Normal non-distended stomach. Normal caliber small bowel with no small bowel wall thickening. Normal appendix. Scattered mild to moderate colonic diverticulosis with no large bowel wall thickening or significant pericolonic fat stranding. Vascular/Lymphatic: Atherosclerotic nonaneurysmal abdominal aorta. Patent portal, splenic, hepatic and renal veins. Enlarged 2.4 cm short axis diameter peripancreatic lymph node adjacent to the pancreatic tail (series 2/image 25). No additional pathologically enlarged lymph nodes in the abdomen or pelvis. Reproductive: Normal size prostate. Other: No pneumoperitoneum, ascites or focal fluid collection. Musculoskeletal: Dense patchy sclerosis in the L5 vertebral body, increased from prior. IMPRESSION: 1. Heterogeneous hypodense 8.2 x 8.1 cm pancreatic tail mass, new, suspicious for primary pancreatic malignancy. MRI abdomen without and with IV contrast recommended for further evaluation. 2. Adjacent peripancreatic lymphadenopathy, suspicious for local nodal metastasis. 3. New hypodense 6.3 cm peripheral segment 7 right liver mass, suspicious for liver metastasis. 4. New left adrenal nodule,  suspicious for adrenal metastasis. 5. Dense  patchy sclerosis in the L5 vertebral body, increased from prior, indeterminate for bone metastasis versus Paget's disease. 6. Aortic Atherosclerosis (ICD10-I70.0). Electronically Signed   By: Ilona Sorrel M.D.   On: 11/19/2020 08:58    Pancreatic mass Control#55 year old male patient with history of diabetes discontentment hospital for worsening abdominal pain/nausea vomiting; CT scan shows pancreatic mass liver lesion/adrenal mass.  #Pancreatic head mass/liver lesion adrenal mass; question T5 vertebral lesion-highly concerning for metastatic malignancy.  #Diabetes-poorly controlled [name] 2022- HbA1c-8.7]-monitor closely.  Recommendations:  #I would recommend biopsy of the liver lesion as part of the diagnostic process.  Discussed with the patient/patient's wife at length regarding my significant concerns for this being malignancy.  Also check CA 19-9; PT PTT.  #N.p.o. post midnight; no anticoagulation.   # Thank you Dr.Niu for allowing me to participate in the care of your pleasant patient. Please do not hesitate to contact me with questions or concerns in the interim.  I left voicemail/messages for radiology department regarding liver biopsy.  # I reviewed the blood work- with the patient in detail; also reviewed the imaging independently [as summarized above]; and with the patient in detail.    All questions were answered. The patient knows to call the clinic with any problems, questions or concerns.    [name], MD 11/19/2020 3:35 PM

## 2020-11-19 NOTE — Assessment & Plan Note (Addendum)
#  55 year old male patient with history of diabetes currently admitted to the hospital  for worsening abdominal pain/nausea vomiting; CT scan shows pancreatic mass liver lesion/adrenal mass.  #Pancreatic head mass/liver lesion adrenal mass; question T5 vertebral lesion-highly concerning for metastatic malignancy-patient awaiting liver biopsy this morning.  Awaiting CA 19-9.  #Abdominal pain secondary malignancy-patient needed 10 mg of IV morphine/24 hours yesterday.  Patient could be transitioned to Percocet oral-post biopsy.   #Diabetes-poorly controlled [name] 2022- HbA1c-8.7]-monitor closely-especially while on treatments.   #Discussed with the patient that we will plan to follow-up later in the week at the cancer center to discuss the pathology results/pain control/treatment options.  Patient in agreement.

## 2020-11-19 NOTE — Consult Note (Signed)
Chief Complaint: Liver mass segment 7. Request is for liver biopsy  Referring Physician(s): Dr. Rogue Bussing  Supervising Physician: Daryll Brod  Patient Status: Dan [name] - In-pt  History of Present Illness: Dan [name] is a 55 y.o. male. H istory of HTN, DM, GERD, kidney stones.  Presented to the ED at [facility] on 3.28.22 with persistent mid and lower abd pain X 3 days with nausea. Pain is worse with inspiration. CT abd pelvis from 3.28.22 reads New hypodense 6.3 cm peripheral segment 7 right liver mass.  Suspicious for liver metastasis.team is requesting liver biopsy for further evaluation.   Currently without any significant complaints. Patient alert and laying in bed, calm and comfortable. Denies any fevers, headache, chest pain, SOB, cough,  vomiting or bleeding. Endorses generalized abdominal pain with nausea. Return precautions and treatment recommendations and follow-up discussed with the patient who is agreeable with the plan.    Past Medical History:  Diagnosis Date  . Acid reflux   . Asthma   . Diabetes mellitus without complication ([place])   . History of kidney stones   . Hypertension   . Kidney stones     Past Surgical History:  Procedure Laterality Date  . COLONOSCOPY WITH PROPOFOL N/A 12/02/2019   Procedure: COLONOSCOPY WITH PROPOFOL;  Surgeon: [name], MD;  Location: [facility] ENDOSCOPY;  Service: Gastroenterology;  Laterality: N/A;  . FRACTURE SURGERY Left at age 51   pelvic fracture  . HEMORRHOID SURGERY N/A 01/31/2020   Procedure: HEMORRHOIDECTOMY;  Surgeon: Jules Husbands, MD;  Location: [facility] ORS;  Service: General;  Laterality: N/A;  . pelvis fractur    . ROTATOR CUFF REPAIR      Allergies: Cherry, Ibuprofen, Other, and Tramadol  Medications: Prior to Admission medications   Medication Sig Start Date End Date Taking? Authorizing Provider  amLODipine (NORVASC) 10 MG tablet TAKE 1 TABLET(10 MG) BY MOUTH DAILY Patient taking differently: Take 10 mg by mouth  daily. 12/12/19  Yes Volney American, PA-C  chlorthalidone (HYGROTON) 25 MG tablet Take 25 mg by mouth every morning. 09/09/20  Yes [provider]  JARDIANCE 10 MG TABS tablet Take 10 mg by mouth daily. 11/06/20  Yes [provider]  losartan (COZAAR) 100 MG tablet TAKE 1 TABLET(100 MG) BY MOUTH DAILY Patient taking differently: Take 100 mg by mouth daily. 12/12/19  Yes Volney American, PA-C  omeprazole (PRILOSEC) 40 MG capsule Take 1 capsule (40 mg total) by mouth daily. 09/08/19  Yes Volney American, PA-C     Family History  Problem Relation Age of Onset  . Cancer Sister        2 sisters 88 and 30- both died of breast cancer  . Diabetes Brother   . Hypertension Brother   . Heart attack Father   . Asthma Son     Social History   Socioeconomic History  . Marital status: Married    Spouse name: Not on file  . Number of children: Not on file  . Years of education: Not on file  . Highest education level: Not on file  Occupational History  . Not on file  Tobacco Use  . Smoking status: Former Smoker    Packs/day: 0.50    Years: 25.00    Pack years: 12.50    Types: Cigarettes  . Smokeless tobacco: Never Used  Vaping Use  . Vaping Use: Never used  Substance and Sexual Activity  . Alcohol use: Yes    Alcohol/week: 6.0 standard drinks  Types: 6 Cans of beer per week    Comment: weekends only  . Drug use: No  . Sexual activity: Yes  Other Topics Concern  . Not on file  Social History Narrative   Works at [employer]. In [place]. Quit smoking 2021 June. Social. Alcohol.    Social Determinants of Health   Financial Resource Strain: Not on file  Food Insecurity: Not on file  Transportation Needs: Not on file  Physical Activity: Not on file  Stress: Not on file  Social Connections: Not on file    Review of Systems: A 12 point ROS discussed and pertinent positives are indicated in the HPI above.  All other systems are negative.  Review of  Systems  Constitutional: Negative for fever.  HENT: Negative for congestion.   Respiratory: Negative for cough and shortness of breath.   Cardiovascular: Negative for chest pain.  Gastrointestinal: Positive for abdominal pain and nausea.  Neurological: Negative for headaches.  Psychiatric/Behavioral: Negative for behavioral problems and confusion.    Vital Signs: BP 132/83 (BP Location: Left Arm)   Pulse 77   Temp 97.8 F (36.6 C)   Resp 18   Ht 6' (1.829 m)   Wt 222 lb (100.7 kg)   SpO2 98%   BMI 30.11 kg/m   Physical Exam Vitals and nursing note reviewed.  Constitutional:      Appearance: He is well-developed.  HENT:     Head: Normocephalic.  Cardiovascular:     Rate and Rhythm: Normal rate and regular rhythm.  Pulmonary:     Effort: Pulmonary effort is normal.     Breath sounds: Normal breath sounds.  Musculoskeletal:        General: Normal range of motion.     Cervical back: Normal range of motion.  Skin:    General: Skin is dry.  Neurological:     Mental Status: He is alert and oriented to person, place, and time.     Imaging: CT Abdomen Pelvis W Contrast  Result Date: 11/19/2020 CLINICAL DATA:  Mid to lower abdominal pain for several days, worse with inspiration. EXAM: CT ABDOMEN AND PELVIS WITH CONTRAST TECHNIQUE: Multidetector CT imaging of the abdomen and pelvis was performed using the standard protocol following bolus administration of intravenous contrast. CONTRAST:  130mL OMNIPAQUE IOHEXOL 300 MG/ML  SOLN COMPARISON:  05/18/2013 CT abdomen/pelvis. FINDINGS: Lower chest: No significant pulmonary nodules or acute consolidative airspace disease. Hepatobiliary: Hypodense 6.3 x 4.9 cm peripheral segment 7 right liver mass (series 2/image 19), new. No additional liver masses. Normal gallbladder with no radiopaque cholelithiasis. No biliary ductal dilatation. Pancreas: Heterogeneous hypodense 8.2 x 8.1 cm pancreatic tail mass (series 2/image 24), new. No pancreatic  duct dilation. Spleen: Normal size. No mass. Adrenals/Urinary Tract: Left adrenal 1.9 cm nodule with density 55 HU, new. No right adrenal nodules. Normal kidneys with no hydronephrosis and no renal mass. Normal bladder. Stomach/Bowel: Normal non-distended stomach. Normal caliber small bowel with no small bowel wall thickening. Normal appendix. Scattered mild to moderate colonic diverticulosis with no large bowel wall thickening or significant pericolonic fat stranding. Vascular/Lymphatic: Atherosclerotic nonaneurysmal abdominal aorta. Patent portal, splenic, hepatic and renal veins. Enlarged 2.4 cm short axis diameter peripancreatic lymph node adjacent to the pancreatic tail (series 2/image 25). No additional pathologically enlarged lymph nodes in the abdomen or pelvis. Reproductive: Normal size prostate. Other: No pneumoperitoneum, ascites or focal fluid collection. Musculoskeletal: Dense patchy sclerosis in the L5 vertebral body, increased from prior. IMPRESSION: 1. Heterogeneous hypodense 8.2 x 8.1  cm pancreatic tail mass, new, suspicious for primary pancreatic malignancy. MRI abdomen without and with IV contrast recommended for further evaluation. 2. Adjacent peripancreatic lymphadenopathy, suspicious for local nodal metastasis. 3. New hypodense 6.3 cm peripheral segment 7 right liver mass, suspicious for liver metastasis. 4. New left adrenal nodule, suspicious for adrenal metastasis. 5. Dense patchy sclerosis in the L5 vertebral body, increased from prior, indeterminate for bone metastasis versus Paget's disease. 6. Aortic Atherosclerosis (ICD10-I70.0). Electronically Signed   By: Ilona Sorrel M.D.   On: 11/19/2020 08:58    Labs:  CBC: Recent Labs    01/27/20 1012 05/08/20 1955 05/09/20 0921 11/19/20 0745  WBC 6.2 5.5 5.3 5.8  HGB 13.3 12.5* 13.5 11.6*  HCT 39.1 35.2* 38.3* 34.0*  PLT 236 262 249 213    COAGS: Recent Labs    11/19/20 1346  INR 1.1  APTT 24    BMP: Recent Labs     01/27/20 1012 05/08/20 1955 05/09/20 0921 11/19/20 0745  NA 140 132* 135 136  K 3.9 4.0 4.0 3.1*  CL 105 93* 95* 100  CO2 27 28 29 24   GLUCOSE 106* 592* 449* 172*  BUN 13 26* 22* 18  CALCIUM 9.1 9.8 9.6 9.2  CREATININE 0.90 1.14 1.07 1.10  GFRNONAA >60 >60 >60 >60  GFRAA >60 >60 >60  --     LIVER FUNCTION TESTS: Recent Labs    11/19/20 0745  BILITOT 0.9  AST 51*  ALT 44  ALKPHOS 89  PROT 8.1  ALBUMIN 4.0     Assessment and Plan:  55 y.o. male inpatient. History of HTN, DM, GERD, kidney stones.  Presented to the ED at [facility] on 3.28.22 with persistent mid and lower abd pain X 3 days with nausea. Pain is worse with inspiration. CT abd pelvis from 3.28.22 reads New hypodense 6.3 cm peripheral segment 7 right liver mass.  Suspicious for liver metastasis.team is requesting liver biopsy for further evaluation.   AST 51, potassium 3.1. Pharmacy verifying prophylactic heparin. Allergies include tramadol and ibuprofen.  IR consulted for possible liver biopsy. Case has been reviewed and procedure approved by Dr. Annamaria Boots.  Patient tentatively scheduled for 3.29.22.  Team instructed to: Keep Patient to be NPO after midnight Hold prophylactic anticoagulation 24 hours prior to scheduled procedure IR will call patient when ready.  Risks and benefits of liver was discussed with the patient and/or patient's family including, but not limited to bleeding, infection, damage to adjacent structures or low yield requiring additional tests.  All of the questions were answered and there is agreement to proceed.  Consent signed and in IR Charge RN office      Thank you for this interesting consult.  I greatly enjoyed meeting YUKI PURVES and look forward to participating in their care.  A copy of this report was sent to the requesting provider on this date.  Electronically Signed: Jacqualine Mau, NP 11/19/2020, 4:20 PM   I spent a total of 40 Minutes    in face to face in clinical  consultation, greater than 50% of which was counseling/coordinating care for liver biopsy

## 2020-11-19 NOTE — H&P (Addendum)
History and Physical    Dan [name] KGY:[id number] DOB: 04/20/1966 DOA: 11/19/2020  Referring MD/NP/PA:   [facility]   Patient coming from:  The patient is coming from home.  At baseline, pt is independent for most of ADL.        Chief Complaint: Abdominal pain  HPI: Dan [name] is a 55 y.o. male with medical history significant of hypertension, diabetes mellitus, asthma, GERD, kidney stone, who presents with abdominal pain.  Patient states that he has been having abdominal pain for more than 3 days, which is located in the left lower quadrant, initially 10 out of 10 in severity, currently mild, sharp, radiating to the left flank area.  It is aggravated with deep breath.  Patient has nausea and vomited once, no diarrhea.  Denies fever or chills.  No chest pain, cough, shortness breath.  No symptoms of UTI.  No unilateral weakness.  ED Course: pt was found to have WBC 5.8, lipase 41, negative urinalysis, pending COVID-19 PCR, potassium 3.1, renal function okay, temperature normal, blood pressure 128/80, heart rate 76, RR 17, oxygen saturation 97, 93% on room air.  CT abdomen/pelvis that showed pancreatic tail mass with possible liver metastasis.  Patient is admitted to [facility] bed as inpatient.  Dr. Rogue Bussing of oncology is consulted.  CT abdomen/pelvis: 1. Heterogeneous hypodense 8.2 x 8.1 cm pancreatic tail mass, new, suspicious for primary pancreatic malignancy. MRI abdomen without and with IV contrast recommended for further evaluation. 2. Adjacent peripancreatic lymphadenopathy, suspicious for local nodal metastasis. 3. New hypodense 6.3 cm peripheral segment 7 right liver mass, suspicious for liver metastasis. 4. New left adrenal nodule, suspicious for adrenal metastasis. 5. Dense patchy sclerosis in the L5 vertebral body, increased from prior, indeterminate for bone metastasis versus Paget's disease. 6. Aortic Atherosclerosis (ICD10-I70.0    Review of Systems:    General: no fevers, chills, no body weight gain, has fatigue HEENT: no blurry vision, hearing changes or sore throat Respiratory: no dyspnea, coughing, wheezing CV: no chest pain, no palpitations GI: Has nausea, vomiting, abdominal pain, no diarrhea, constipation GU: no dysuria, burning on urination, increased urinary frequency, hematuria  Ext: no leg edema Neuro: no unilateral weakness, numbness, or tingling, no vision change or hearing loss Skin: no rash, no skin tear. MSK: No muscle spasm, no deformity, no limitation of range of movement in spin Heme: No easy bruising.  Travel history: No recent long distant travel.  Allergy:  Allergies  Allergen Reactions  . Cherry Nausea And Vomiting  . Ibuprofen Nausea And Vomiting  . Other Nausea And Vomiting    olives  . Tramadol Other (See Comments)    Headaches.     Past Medical History:  Diagnosis Date  . Acid reflux   . Asthma   . Diabetes mellitus without complication ([place])   . History of kidney stones   . Hypertension   . Kidney stones     Past Surgical History:  Procedure Laterality Date  . COLONOSCOPY WITH PROPOFOL N/A 12/02/2019   Procedure: COLONOSCOPY WITH PROPOFOL;  Surgeon: [name], MD;  Location: [facility] ENDOSCOPY;  Service: Gastroenterology;  Laterality: N/A;  . FRACTURE SURGERY Left at age 1   pelvic fracture  . HEMORRHOID SURGERY N/A 01/31/2020   Procedure: HEMORRHOIDECTOMY;  Surgeon: Jules Husbands, MD;  Location: [facility] ORS;  Service: General;  Laterality: N/A;  . pelvis fractur    . ROTATOR CUFF REPAIR      Social History:  reports that he  has quit smoking. His smoking use included cigarettes. He has a 12.50 pack-year smoking history. He has never used smokeless tobacco. He reports current alcohol use of about 6.0 standard drinks of alcohol per week. He reports that he does not use drugs.  Family History:  Family History  Problem Relation Age of Onset  . Cancer Sister        2 sisters 83 and 69- both died  of breast cancer  . Diabetes Brother   . Hypertension Brother   . Heart attack Father   . Asthma Son      Prior to Admission medications   Medication Sig Start Date End Date Taking? Authorizing Provider  amLODipine (NORVASC) 10 MG tablet TAKE 1 TABLET(10 MG) BY MOUTH DAILY Patient taking differently: Take 10 mg by mouth daily. 12/12/19  Yes Volney American, PA-C  chlorthalidone (HYGROTON) 25 MG tablet Take 25 mg by mouth every morning. 09/09/20  Yes [provider]  JARDIANCE 10 MG TABS tablet Take 10 mg by mouth daily. 11/06/20  Yes [provider]  losartan (COZAAR) 100 MG tablet TAKE 1 TABLET(100 MG) BY MOUTH DAILY Patient taking differently: Take 100 mg by mouth daily. 12/12/19  Yes Volney American, PA-C  omeprazole (PRILOSEC) 40 MG capsule Take 1 capsule (40 mg total) by mouth daily. 09/08/19  Yes Volney American, [place]    Physical Exam: Vitals:   11/19/20 0837 11/19/20 1116 11/19/20 1235 11/19/20 1611  BP:   130/83 132/83  Pulse: 76 60 68 77  Resp: 17 11  18   Temp:  98.9 F (37.2 C)  97.8 F (36.6 C)  TempSrc:  Oral    SpO2: 93% 96% 98% 98%  Weight:      Height:       General: Not in acute distress HEENT:       Eyes: PERRL, EOMI, no scleral icterus.       ENT: No discharge from the ears and nose, no pharynx injection, no tonsillar enlargement.        Neck: No JVD, no bruit, no mass felt. Heme: No neck lymph node enlargement. Cardiac: S1/S2, RRR, No murmurs, No gallops or rubs. Respiratory: No rales, wheezing, rhonchi or rubs. GI: Soft, nondistended, has tenderness left lower quadrant, no rebound pain, no organomegaly, BS present. GU: No hematuria Ext: No pitting leg edema bilaterally. 1+DP/PT pulse bilaterally. Musculoskeletal: No joint deformities, No joint redness or warmth, no limitation of ROM in spin. Skin: No rashes.  Neuro: Alert, oriented X3, cranial nerves II-XII grossly intact, moves all extremities normally.  Psych:  Patient is not psychotic, no suicidal or hemocidal ideation.  Labs on Admission: I have personally reviewed following labs and imaging studies  CBC: Recent Labs  Lab 11/19/20 0745  WBC 5.8  HGB 11.6*  HCT 34.0*  MCV 81.5  PLT 810   Basic Metabolic Panel: Recent Labs  Lab 11/19/20 0745 11/19/20 1346  NA 136  --   K 3.1*  --   CL 100  --   CO2 24  --   GLUCOSE 172*  --   BUN 18  --   CREATININE 1.10  --   CALCIUM 9.2  --   MG  --  1.9   GFR: Estimated Creatinine Clearance: 92.1 mL/min (by C-G formula based on SCr of 1.1 mg/dL). Liver Function Tests: Recent Labs  Lab 11/19/20 0745  AST 51*  ALT 44  ALKPHOS 89  BILITOT 0.9  PROT 8.1  ALBUMIN 4.0  Recent Labs  Lab 11/19/20 0745  LIPASE 41   No results for input(s): AMMONIA in the last 168 hours. Coagulation Profile: Recent Labs  Lab 11/19/20 1346  INR 1.1   Cardiac Enzymes: No results for input(s): CKTOTAL, CKMB, CKMBINDEX, TROPONINI in the last 168 hours. BNP (last 3 results) No results for input(s): PROBNP in the last 8760 hours. HbA1C: No results for input(s): HGBA1C in the last 72 hours. CBG: Recent Labs  Lab 11/19/20 1612  GLUCAP 128*   Lipid Profile: No results for input(s): CHOL, HDL, LDLCALC, TRIG, CHOLHDL, LDLDIRECT in the last 72 hours. Thyroid Function Tests: No results for input(s): TSH, T4TOTAL, FREET4, T3FREE, THYROIDAB in the last 72 hours. Anemia Panel: No results for input(s): VITAMINB12, FOLATE, FERRITIN, TIBC, IRON, RETICCTPCT in the last 72 hours. Urine analysis:    Component Value Date/Time   COLORURINE YELLOW (A) 11/19/2020 0744   APPEARANCEUR CLEAR (A) 11/19/2020 0744   APPEARANCEUR Clear 11/14/2019 1533   LABSPEC 1.029 11/19/2020 0744   PHURINE 6.0 11/19/2020 0744   GLUCOSEU >=500 (A) 11/19/2020 0744   HGBUR NEGATIVE 11/19/2020 0744   BILIRUBINUR NEGATIVE 11/19/2020 0744   BILIRUBINUR Negative 11/14/2019 1533   KETONESUR NEGATIVE 11/19/2020 0744   PROTEINUR  NEGATIVE 11/19/2020 0744   UROBILINOGEN 0.2 06/07/2013 1513   NITRITE NEGATIVE 11/19/2020 0744   LEUKOCYTESUR NEGATIVE 11/19/2020 0744   Sepsis Labs: @LABRCNTIP (procalcitonin:4,lacticidven:4) )No results found for this or any previous visit (from the past 240 hour(s)).   Radiological Exams on Admission: CT Abdomen Pelvis W Contrast  Result Date: 11/19/2020 CLINICAL DATA:  Mid to lower abdominal pain for several days, worse with inspiration. EXAM: CT ABDOMEN AND PELVIS WITH CONTRAST TECHNIQUE: Multidetector CT imaging of the abdomen and pelvis was performed using the standard protocol following bolus administration of intravenous contrast. CONTRAST:  133mL OMNIPAQUE IOHEXOL 300 MG/ML  SOLN COMPARISON:  05/18/2013 CT abdomen/pelvis. FINDINGS: Lower chest: No significant pulmonary nodules or acute consolidative airspace disease. Hepatobiliary: Hypodense 6.3 x 4.9 cm peripheral segment 7 right liver mass (series 2/image 19), new. No additional liver masses. Normal gallbladder with no radiopaque cholelithiasis. No biliary ductal dilatation. Pancreas: Heterogeneous hypodense 8.2 x 8.1 cm pancreatic tail mass (series 2/image 24), new. No pancreatic duct dilation. Spleen: Normal size. No mass. Adrenals/Urinary Tract: Left adrenal 1.9 cm nodule with density 55 HU, new. No right adrenal nodules. Normal kidneys with no hydronephrosis and no renal mass. Normal bladder. Stomach/Bowel: Normal non-distended stomach. Normal caliber small bowel with no small bowel wall thickening. Normal appendix. Scattered mild to moderate colonic diverticulosis with no large bowel wall thickening or significant pericolonic fat stranding. Vascular/Lymphatic: Atherosclerotic nonaneurysmal abdominal aorta. Patent portal, splenic, hepatic and renal veins. Enlarged 2.4 cm short axis diameter peripancreatic lymph node adjacent to the pancreatic tail (series 2/image 25). No additional pathologically enlarged lymph nodes in the abdomen or  pelvis. Reproductive: Normal size prostate. Other: No pneumoperitoneum, ascites or focal fluid collection. Musculoskeletal: Dense patchy sclerosis in the L5 vertebral body, increased from prior. IMPRESSION: 1. Heterogeneous hypodense 8.2 x 8.1 cm pancreatic tail mass, new, suspicious for primary pancreatic malignancy. MRI abdomen without and with IV contrast recommended for further evaluation. 2. Adjacent peripancreatic lymphadenopathy, suspicious for local nodal metastasis. 3. New hypodense 6.3 cm peripheral segment 7 right liver mass, suspicious for liver metastasis. 4. New left adrenal nodule, suspicious for adrenal metastasis. 5. Dense patchy sclerosis in the L5 vertebral body, increased from prior, indeterminate for bone metastasis versus Paget's disease. 6. Aortic Atherosclerosis (ICD10-I70.0). Electronically Signed  By: Ilona Sorrel M.D.   On: 11/19/2020 08:58     EKG: Not done in ED, will get one.   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  Dr. Rogue Bussing of oncology is consulted.  Plan to do liver biopsy tomorrow  -Admitted to MedSurg vaccine patient -As needed morphine and Percocet for pain -As needed Zofran for nausea -N.p.o. after midnight  HTN (hypertension) -IV hydralazine as needed -Continue home amlodipine, [place], Cozaar  Type 2 diabetes mellitus without complication ([place]): Recent A1c 5.5, well controlled.  Patient is taking Jardiance -Sliding scale insulin  Asthma: Stable -As needed albuterol  Acid reflux -Protonix  Hypokalemia: Potassium 3.1 -Repleted potassium -Check magnesium level    DVT ppx: SQ Heparin  Code Status: Full code Family Communication: not done, no family member is at bed side.  Disposition Plan:  Anticipate discharge  back to previous environment Consults called: Dr. Rogue Bussing of oncology Admission status and Level of care: Med-Surg:   as inpt        Status is: Inpatient  Remains inpatient appropriate because:Inpatient level of care appropriate due to severity of illness   Dispo: The patient is from: Home              Anticipated d/c is to: Home              Patient currently is not medically stable to d/c.   Difficult to place patient No          Date of Service 11/19/2020    [facility]   If 7PM-7AM, please contact night-coverage [email or web address] 11/19/2020, 6:13 PM

## 2020-11-19 NOTE — Telephone Encounter (Signed)
On 3/28-spoke to patient's daughter Nat Math [phone] the significant concerns for malignancy noted on the CT scan.; and the plan to proceed with liver biopsy in AM.  Dr.B

## 2020-11-19 NOTE — ED Provider Notes (Signed)
[facility] Emergency Department Provider Note   ____________________________________________   Event Date/Time   First MD Initiated Contact with Patient 11/19/20 [id number]     (approximate)  I have reviewed the triage vital signs and the nursing notes.   HISTORY  Chief Complaint Abdominal Pain    HPI Dan [name] is a 55 y.o. male with past medical history of hypertension, diabetes, GERD, asthma, and kidney stones who presents to the ED complaining of abdominal pain.  Patient reports that he has had constant pain in the center of his lower abdomen for the past 3 days.  He describes it as sharp and it is exacerbated when he goes to take a deep breath.  It is associated with pain extending up into his left flank, he had some nausea with vomiting 3 days ago, but this has since resolved.  He denies any fevers, cough, chest pain, or shortness of breath.  He denies dysuria or hematuria, but states his urine has appeared darker with an odor recently.  He describes current symptoms as similar to prior kidney stones, he denies any history of UTI in the past and has never had abdominal surgery.        Past Medical History:  Diagnosis Date  . Acid reflux   . Asthma   . Diabetes mellitus without complication ([place])   . History of kidney stones   . Hypertension   . Kidney stones     Patient Active Problem List   Diagnosis Date Noted  . Flank pain 11/14/2019  . Asthma   . Acid reflux   . Type 2 diabetes mellitus without complication ([place]) 32/67/1245  . HTN (hypertension) 08/17/2013    Past Surgical History:  Procedure Laterality Date  . COLONOSCOPY WITH PROPOFOL N/A 12/02/2019   Procedure: COLONOSCOPY WITH PROPOFOL;  Surgeon: [name], MD;  Location: [facility] ENDOSCOPY;  Service: Gastroenterology;  Laterality: N/A;  . FRACTURE SURGERY Left at age 1   pelvic fracture  . HEMORRHOID SURGERY N/A 01/31/2020   Procedure: HEMORRHOIDECTOMY;  Surgeon: Jules Husbands, MD;   Location: [facility] ORS;  Service: General;  Laterality: N/A;  . pelvis fractur    . ROTATOR CUFF REPAIR      Prior to Admission medications   Medication Sig Start Date End Date Taking? Authorizing Provider  albuterol (VENTOLIN HFA) 108 (90 Base) MCG/ACT inhaler Inhale 2 puffs into the lungs every 6 (six) hours as needed for wheezing or shortness of breath. 09/08/19   Volney American, PA-C  amLODipine (NORVASC) 10 MG tablet TAKE 1 TABLET(10 MG) BY MOUTH DAILY Patient taking differently: Take 10 mg by mouth daily.  12/12/19   Volney American, PA-C  etodolac (LODINE) 200 MG capsule Take 1 capsule (200 mg total) by mouth every 6 (six) hours as needed for moderate pain. Patient not taking: Reported on 01/11/2020 11/14/19   Eulogio Bear, NP  fluticasone [facility]) 50 MCG/ACT nasal spray Place 1 spray into both nostrils daily as needed for allergies or rhinitis.    [provider]  hydrochlorothiazide (HYDRODIURIL) 12.5 MG tablet TAKE 1 TABLET(12.5 MG) BY MOUTH DAILY Patient taking differently: Take 12.5 mg by mouth daily.  11/17/19   Volney American, PA-C  HYDROcodone-acetaminophen (NORCO/VICODIN) 5-325 MG tablet Take 1 tablet by mouth every 6 (six) hours as needed for moderate pain. 01/31/20   Pabon, Diego F, MD  losartan (COZAAR) 100 MG tablet TAKE 1 TABLET(100 MG) BY MOUTH DAILY Patient taking differently: Take 100  mg by mouth daily.  12/12/19   Volney American, PA-C  omeprazole (PRILOSEC OTC) 20 MG tablet Take 20 mg by mouth daily.    [provider]  omeprazole (PRILOSEC) 40 MG capsule Take 1 capsule (40 mg total) by mouth daily. 09/08/19   Volney American, PA-C  ondansetron (ZOFRAN ODT) 4 MG disintegrating tablet Take 1 tablet (4 mg total) by mouth every 8 (eight) hours as needed for nausea or vomiting. Patient not taking: Reported on 01/11/2020 11/14/19   Eulogio Bear, NP  tamsulosin (FLOMAX) 0.4 MG CAPS capsule Take 1 capsule (0.4 mg total)  by mouth at bedtime. Patient not taking: Reported on 01/11/2020 11/14/19   Eulogio Bear, NP    Allergies Cherry, Ibuprofen, Other, and Tramadol  Family History  Problem Relation Age of Onset  . Cancer Sister   . Diabetes Brother   . Hypertension Brother   . Heart attack Father   . Asthma Son     Social History Social History   Tobacco Use  . Smoking status: Former Smoker    Packs/day: 0.50    Years: 25.00    Pack years: 12.50    Types: Cigarettes  . Smokeless tobacco: Never Used  Vaping Use  . Vaping Use: Never used  Substance Use Topics  . Alcohol use: Yes    Alcohol/week: 6.0 standard drinks    Types: 6 Cans of beer per week    Comment: weekends only  . Drug use: No    Review of Systems  Constitutional: No fever/chills Eyes: No visual changes. ENT: No sore throat. Cardiovascular: Denies chest pain. Respiratory: Denies shortness of breath. Gastrointestinal: Positive for abdominal pain, nausea, and vomiting.  No diarrhea.  No constipation. Genitourinary: Negative for dysuria. Musculoskeletal: Negative for back pain. Skin: Negative for rash. Neurological: Negative for headaches, focal weakness or numbness.  ____________________________________________   PHYSICAL EXAM:  VITAL SIGNS: ED Triage Vitals  Enc Vitals Group     BP 11/19/20 0742 (!) 128/92     Pulse Rate 11/19/20 0742 95     Resp 11/19/20 0742 16     Temp 11/19/20 0742 98.8 F (37.1 C)     Temp Source 11/19/20 0742 Oral     SpO2 11/19/20 0742 97 %     Weight 11/19/20 0743 222 lb (100.7 kg)     Height 11/19/20 0743 6' (1.829 m)     Head Circumference --      Peak Flow --      Pain Score 11/19/20 0742 10     Pain Loc --      Pain Edu? --      Excl. in [place]? --     Constitutional: Alert and oriented. Eyes: Conjunctivae are normal. Head: Atraumatic. Nose: No congestion/rhinnorhea. Mouth/Throat: Mucous membranes are moist. Neck: Normal ROM Cardiovascular: Normal rate, regular  rhythm. Grossly normal heart sounds. Respiratory: Normal respiratory effort.  No retractions. Lungs CTAB. Gastrointestinal: Soft and tender to palpation in bilateral lower quadrants with no rebound or guarding.  Left CVA tenderness noted. No distention. Genitourinary: deferred Musculoskeletal: No lower extremity tenderness nor edema. Neurologic:  Normal speech and language. No gross focal neurologic deficits are appreciated. Skin:  Skin is warm, dry and intact. No rash noted. Psychiatric: Mood and affect are normal. Speech and behavior are normal.  ____________________________________________   LABS (all labs ordered are listed, but only abnormal results are displayed)  Labs Reviewed  COMPREHENSIVE METABOLIC PANEL - Abnormal; Notable for the following components:  Result Value   Potassium 3.1 (*)    Glucose, Bld 172 (*)    AST 51 (*)    All other components within normal limits  CBC - Abnormal; Notable for the following components:   RBC 4.17 (*)    Hemoglobin 11.6 (*)    HCT 34.0 (*)    All other components within normal limits  URINALYSIS, COMPLETE (UACMP) WITH MICROSCOPIC - Abnormal; Notable for the following components:   Color, Urine YELLOW (*)    APPearance CLEAR (*)    Glucose, UA >=500 (*)    All other components within normal limits  LIPASE, BLOOD    PROCEDURES  Procedure(s) performed (including Critical Care):  Procedures   ____________________________________________   INITIAL IMPRESSION / ASSESSMENT AND PLAN / ED COURSE       55 year old male with past medical history of hypertension, diabetes, GERD, asthma, and kidney stones who presents to the ED with 3 days of constant lower abdominal pain associated with dark-colored urine and left-sided flank pain.  Pain is reproduced with palpation of the both lower quadrants and patient additionally has left CVA tenderness.  UA shows no signs of infection and no hematuria.  We will treat symptomatically with  morphine and Zofran, hydrate with IV fluids, further assess for kidney stone or other pathology with CT scan.  CT scan shows pancreatic mass as well as additional liver and adrenal masses concerning for pancreatic malignancy with metastasis.  Case discussed with Dr. Rogue Bussing of oncology, who recommends admission for liver biopsy and further management.  Results of CT imaging discussed with patient and wife and they are in agreement with plan for admission.  He reports feeling much better following morphine and Zofran.      ____________________________________________   FINAL CLINICAL IMPRESSION(S) / ED DIAGNOSES  Final diagnoses:  Pancreatic mass     ED Discharge Orders    None       Note:  This document was prepared using Dragon voice recognition software and may include unintentional dictation errors.   Blake Divine, MD 11/19/20 1016

## 2020-11-20 ENCOUNTER — Telehealth: Payer: Self-pay | Admitting: Internal Medicine

## 2020-11-20 ENCOUNTER — Inpatient Hospital Stay: Payer: BLUE CROSS/BLUE SHIELD

## 2020-11-20 DIAGNOSIS — R160 Hepatomegaly, not elsewhere classified: Secondary | ICD-10-CM

## 2020-11-20 DIAGNOSIS — K8689 Other specified diseases of pancreas: Secondary | ICD-10-CM | POA: Diagnosis not present

## 2020-11-20 DIAGNOSIS — I10 Essential (primary) hypertension: Secondary | ICD-10-CM | POA: Diagnosis not present

## 2020-11-20 LAB — CBC
HCT: 32.5 % — ABNORMAL LOW (ref 39.0–52.0)
Hemoglobin: 10.5 g/dL — ABNORMAL LOW (ref 13.0–17.0)
MCH: 27 pg (ref 26.0–34.0)
MCHC: 32.3 g/dL (ref 30.0–36.0)
MCV: 83.5 fL (ref 80.0–100.0)
Platelets: 199 10*3/uL (ref 150–400)
RBC: 3.89 MIL/uL — ABNORMAL LOW (ref 4.22–5.81)
RDW: 13.6 % (ref 11.5–15.5)
WBC: 4.9 10*3/uL (ref 4.0–10.5)
nRBC: 0 % (ref 0.0–0.2)

## 2020-11-20 LAB — BASIC METABOLIC PANEL
Anion gap: 7 (ref 5–15)
BUN: 15 mg/dL (ref 6–20)
CO2: 28 mmol/L (ref 22–32)
Calcium: 9.2 mg/dL (ref 8.9–10.3)
Chloride: 103 mmol/L (ref 98–111)
Creatinine, Ser: 1.09 mg/dL (ref 0.61–1.24)
GFR, Estimated: >60 mL/min (ref >60)
Glucose, Bld: 148 mg/dL — ABNORMAL HIGH (ref 70–99)
Potassium: 3.7 mmol/L (ref 3.5–5.1)
Sodium: 138 mmol/L (ref 135–145)

## 2020-11-20 LAB — SARS CORONAVIRUS 2 (TAT 6-24 HRS): SARS Coronavirus 2: NEGATIVE

## 2020-11-20 LAB — CA 19-9 (SERIAL): CA 19-9: 11 U/mL (ref 0–35)

## 2020-11-20 LAB — GLUCOSE, CAPILLARY: Glucose-Capillary: 146 mg/dL — ABNORMAL HIGH (ref 70–99)

## 2020-11-20 MED ORDER — MIDAZOLAM HCL 2 MG/2ML IJ SOLN
INTRAMUSCULAR | Status: AC
  Filled 2020-11-20: qty 2

## 2020-11-20 MED ORDER — SODIUM CHLORIDE 0.9 % IV SOLN
INTRAVENOUS | Status: AC | PRN
  Administered 2020-11-20: 10 mL/h via INTRAVENOUS

## 2020-11-20 MED ORDER — OXYCODONE-ACETAMINOPHEN 5-325 MG PO TABS: 1.0000 | ORAL_TABLET | Freq: Four times a day (QID) | ORAL | 0 refills | Status: DC | PRN

## 2020-11-20 MED ORDER — FENTANYL CITRATE (PF) 100 MCG/2ML IJ SOLN
INTRAMUSCULAR | Status: AC | PRN
  Administered 2020-11-20: 50 ug via INTRAVENOUS

## 2020-11-20 MED ORDER — FENTANYL CITRATE (PF) 100 MCG/2ML IJ SOLN
INTRAMUSCULAR | Status: AC
  Filled 2020-11-20: qty 2

## 2020-11-20 MED ORDER — MIDAZOLAM HCL 2 MG/2ML IJ SOLN
INTRAMUSCULAR | Status: AC | PRN
  Administered 2020-11-20: 1 mg via INTRAVENOUS

## 2020-11-20 NOTE — Discharge Summary (Signed)
Physician Discharge Summary  Dan [name]:[id number] DOB: 1966/04/20 DOA: 11/19/2020  [facility]  Admit date: 11/19/2020 Discharge date: 11/20/2020  Admitted From: home  Disposition:  Home   Recommendations for Outpatient Follow-up:  1. Follow up with PCP in 1-2 weeks 2. F/u w/ onco, Dr. Rogue Bussing in 1 week   Home Health: no  Equipment/Devices  Discharge Condition: stable  CODE STATUS: full  Diet recommendation: Heart Healthy / Carb Modified  Brief/Interim Summary: HPI was taken from Dr. Blaine Hamper: Dan Coyer Brownis a 55 y.o.malewith medical history significant ofhypertension, diabetes mellitus, asthma, GERD, kidney stone, who presents with abdominal pain.  Patient states that he has been having abdominal pain for more than 3 days, which is located in the left lower quadrant, initially 10 out of 10 in severity, currently mild, sharp, radiating to the left flank area. It is aggravated with deep breath. Patient has nausea and vomited once, no diarrhea. Denies fever or chills. No chest pain, cough, shortness breath. No symptoms of UTI. No unilateral weakness.  ED Course:pt was found to have WBC 5.8, lipase 41, negative urinalysis, pending COVID-19 PCR, potassium 3.1, renal function okay, temperature normal, blood pressure 128/80, heart rate 76, RR 17, oxygen saturation 97, 93% on room air. CT abdomen/pelvis that showed pancreatic tail mass with possible liver metastasis.Patient is admitted to [facility] bed as inpatient. Dr. Rogue Bussing of oncology is consulted.  Hospital course from Dr. [name] 11/20/20: Pt was found to have pancreatic mass with likely liver mets. CA 19-9 was WNL. Pt is s/p liver biopsy by IR. Pathology was pending at time of d/c. Pt will f/u w/ oncology, Dr. Rogue Bussing, in 1 week to f/u on pathology results. Pt verbalized his understanding.   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 Will go for liver biopsy today. Onco is following and recs apprec. Morphine, percocet prn   HTN: continue on amlodipine, losartan, chlorthalidone  DM2: well controlled w/ HbA1c 5.5. Continue on SSI w/ accuchecks   Asthma: w/o exacerbation. Continue on bronchodilators   GERD: continue on PPI  Hypokalemia: WNL today      Discharge Instructions  Discharge Instructions    Diet general   Complete by: As directed    Discharge instructions   Complete by: As directed    F/u PCP in 1-2 weeks. F/u w/ oncology, Dr. Rogue Bussing, in 1 week   Increase activity slowly   Complete by: As directed    No wound care   Complete by: As directed      Allergies as of 11/20/2020      Reactions   Cherry Nausea And Vomiting   Ibuprofen Nausea And Vomiting   Other Nausea And Vomiting   olives   Tramadol Other (See Comments)   Headaches.       Medication List    TAKE these medications   amLODipine 10 MG tablet Commonly known as: NORVASC TAKE 1 TABLET(10 MG) BY MOUTH DAILY What changed:   how much to take  how to take this  when to take this  additional instructions   chlorthalidone 25 MG tablet Commonly known as: HYGROTON Take 25 mg by mouth every morning.   Jardiance 10 MG Tabs tablet Generic drug: empagliflozin Take 10 mg by mouth daily.   losartan 100  MG tablet Commonly known as: COZAAR TAKE 1 TABLET(100 MG) BY MOUTH DAILY What changed:   how much to take  how to take this  when to take this  additional instructions   omeprazole 40 MG capsule Commonly known as: PRILOSEC Take 1 capsule (40 mg total) by mouth daily.   oxyCODONE-acetaminophen 5-325 MG tablet Commonly known as: PERCOCET/ROXICET Take 1 tablet by mouth every 6 (six) hours as needed for up to 7 days for moderate pain or severe pain.        Allergies  Allergen Reactions  . Cherry Nausea And Vomiting  . Ibuprofen Nausea And Vomiting  . Other Nausea And Vomiting    olives  . Tramadol Other (See Comments)    Headaches.     Consultations:  onco   Procedures/Studies: CT Abdomen Pelvis W Contrast  Result Date: 11/19/2020 CLINICAL DATA:  Mid to lower abdominal pain for several days, worse with inspiration. EXAM: CT ABDOMEN AND PELVIS WITH CONTRAST TECHNIQUE: Multidetector CT imaging of the abdomen and pelvis was performed using the standard protocol following bolus administration of intravenous contrast. CONTRAST:  148mL OMNIPAQUE IOHEXOL 300 MG/ML  SOLN COMPARISON:  05/18/2013 CT abdomen/pelvis. FINDINGS: Lower chest: No significant pulmonary nodules or acute consolidative airspace disease. Hepatobiliary: Hypodense 6.3 x 4.9 cm peripheral segment 7 right liver mass (series 2/image 19), new. No additional liver masses. Normal gallbladder with no radiopaque cholelithiasis. No biliary ductal dilatation. Pancreas: Heterogeneous hypodense 8.2 x 8.1 cm pancreatic tail mass (series 2/image 24), new. No pancreatic duct dilation. Spleen: Normal size. No mass. Adrenals/Urinary Tract: Left adrenal 1.9 cm nodule with density 55 HU, new. No right adrenal nodules. Normal kidneys with no hydronephrosis and no renal mass. Normal bladder. Stomach/Bowel: Normal non-distended stomach. Normal caliber small bowel with no small bowel wall thickening. Normal appendix. Scattered mild to moderate colonic diverticulosis with no large bowel wall thickening or significant pericolonic fat stranding. Vascular/Lymphatic: Atherosclerotic nonaneurysmal abdominal aorta. Patent portal, splenic, hepatic and renal veins. Enlarged 2.4 cm short axis diameter peripancreatic lymph node adjacent to the pancreatic tail (series 2/image 25). No additional pathologically enlarged lymph nodes in the abdomen or pelvis. Reproductive: Normal size prostate. Other: No pneumoperitoneum,  ascites or focal fluid collection. Musculoskeletal: Dense patchy sclerosis in the L5 vertebral body, increased from prior. IMPRESSION: 1. Heterogeneous hypodense 8.2 x 8.1 cm pancreatic tail mass, new, suspicious for primary pancreatic malignancy. MRI abdomen without and with IV contrast recommended for further evaluation. 2. Adjacent peripancreatic lymphadenopathy, suspicious for local nodal metastasis. 3. New hypodense 6.3 cm peripheral segment 7 right liver mass, suspicious for liver metastasis. 4. New left adrenal nodule, suspicious for adrenal metastasis. 5. Dense patchy sclerosis in the L5 vertebral body, increased from prior, indeterminate for bone metastasis versus Paget's disease. 6. Aortic Atherosclerosis (ICD10-I70.0). Electronically Signed   By: Ilona Sorrel M.D.   On: 11/19/2020 08:58   US BIOPSY (LIVER)  Result Date: 11/20/2020 INDICATION: 8 cm mass of the pancreatic tail and associated 6 cm mass within the liver. The patient presents for liver lesion biopsy. EXAM: ULTRASOUND GUIDED CORE BIOPSY OF LIVER MEDICATIONS: None. ANESTHESIA/SEDATION: Fentanyl 50 mcg IV; Versed 1.0 mg IV Moderate Sedation Time:  24 minutes. The patient was continuously monitored during the procedure by the interventional radiology nurse under my direct supervision. PROCEDURE: The procedure, risks, benefits, and alternatives were explained to the patient. Questions regarding the procedure were encouraged and answered. The patient understands and consents to the procedure. A time-out was performed prior to initiating the  procedure. Ultrasound was performed to localize a lesion within the right lobe of the liver. The right lateral abdominal wall was prepped with chlorhexidine in a sterile fashion, and a sterile drape was applied covering the operative field. A sterile gown and sterile gloves were used for the procedure. Local anesthesia was provided with 1% Lidocaine. Under direct ultrasound guidance, a 17 gauge trocar needle  was advanced to the level of the lesion. Three passes were obtained through the lesion with an 18 gauge core biopsy device. Tissue was submitted in formalin. Gel-Foam pledgets were advanced through the outer needle as the needle was removed and retracted. COMPLICATIONS: None immediate. FINDINGS: Solid hypoechoic mass within the lateral aspect of the right lobe of the liver measures approximately 6.9 x 4.5 x 5.5 cm. Solid tissue was obtained. IMPRESSION: Ultrasound-guided core biopsy performed of a mass within the lateral right lobe of the liver measuring 6.9 cm in greatest diameter by ultrasound. Electronically Signed   By: Aletta Edouard M.D.   On: 11/20/2020 12:37       Subjective: Pt c/o RUQ abd pain    Discharge Exam: Vitals:   11/20/20 1200 11/20/20 1215  BP: 119/80 117/76  Pulse: 75 65  Resp: 14 11  Temp:    SpO2: 95% 95%   Vitals:   11/20/20 1145 11/20/20 1147 11/20/20 1200 11/20/20 1215  BP: 112/79  119/80 117/76  Pulse: 62 64 75 65  Resp: (!) 9 13 14 11   Temp:      TempSrc:      SpO2: 97% 98% 95% 95%  Weight:      Height:        General: Pt is alert, awake, not in acute distress Cardiovascular:  S1/S2 +, no rubs, no gallops Respiratory: CTA bilaterally, no wheezing, no rhonchi Abdominal: Soft, tenderness to palpation, ND, bowel sounds + Extremities: no edema, no cyanosis    The results of significant diagnostics from this hospitalization (including imaging, microbiology, ancillary and laboratory) are listed below for reference.     Microbiology: Recent Results (from the past 240 hour(s))  SARS CORONAVIRUS 2 (TAT 6-24 HRS) Nasopharyngeal Nasopharyngeal Swab     Status: None   Collection Time: 11/19/20 10:51 AM   Specimen: Nasopharyngeal Swab  Result Value Ref Range Status   SARS Coronavirus 2 NEGATIVE NEGATIVE Final    Comment: (NOTE) SARS-CoV-2 target nucleic acids are NOT DETECTED.  The SARS-CoV-2 RNA is generally detectable in upper and  lower respiratory specimens during the acute phase of infection. Negative results do not preclude SARS-CoV-2 infection, do not rule out co-infections with other pathogens, and should not be used as the sole basis for treatment or other patient management decisions. Negative results must be combined with clinical observations, patient history, and epidemiological information. The expected result is Negative.  Fact Sheet for Patients: SugarRoll.be  Fact Sheet for Healthcare Providers: https://www.woods-mathews.com/  This test is not yet approved or cleared by the [place] FDA and  has been authorized for detection and/or diagnosis of SARS-CoV-2 by FDA under an Emergency Use Authorization (EUA). This EUA will remain  in effect (meaning this test can be used) for the duration of the COVID-19 declaration under Se ction 564(b)(1) of the Act, 21 U.S.C. section 360bbb-3(b)(1), unless the authorization is terminated or revoked sooner.  Performed at [facility], [place] [address], [place], [place] [address]      Labs: BNP (last 3 results) No results for input(s): BNP in the last 8760 hours. Basic  Metabolic Panel: Recent Labs  Lab 11/19/20 0745 11/19/20 1346 11/20/20 0514  NA 136  --  138  K 3.1*  --  3.7  CL 100  --  103  CO2 24  --  28  GLUCOSE 172*  --  148*  BUN 18  --  15  CREATININE 1.10  --  1.09  CALCIUM 9.2  --  9.2  MG  --  1.9  --    Liver Function Tests: Recent Labs  Lab 11/19/20 0745  AST 51*  ALT 44  ALKPHOS 89  BILITOT 0.9  PROT 8.1  ALBUMIN 4.0   Recent Labs  Lab 11/19/20 0745  LIPASE 41   No results for input(s): AMMONIA in the last 168 hours. CBC: Recent Labs  Lab 11/19/20 0745 11/20/20 0514  WBC 5.8 4.9  HGB 11.6* 10.5*  HCT 34.0* 32.5*  MCV 81.5 83.5  PLT 213 199   Cardiac Enzymes: No results for input(s): CKTOTAL, CKMB, CKMBINDEX, TROPONINI in the last 168 hours. BNP: Invalid  input(s): POCBNP CBG: Recent Labs  Lab 11/19/20 1612 11/19/20 2118 11/20/20 0723  GLUCAP 128* 149* 146*   D-Dimer No results for input(s): DDIMER in the last 72 hours. Hgb A1c No results for input(s): HGBA1C in the last 72 hours. Lipid Profile No results for input(s): CHOL, HDL, LDLCALC, TRIG, CHOLHDL, LDLDIRECT in the last 72 hours. Thyroid function studies No results for input(s): TSH, T4TOTAL, T3FREE, THYROIDAB in the last 72 hours.  Invalid input(s): FREET3 Anemia work up No results for input(s): VITAMINB12, FOLATE, FERRITIN, TIBC, IRON, RETICCTPCT in the last 72 hours. Urinalysis    Component Value Date/Time   COLORURINE YELLOW (A) 11/19/2020 0744   APPEARANCEUR CLEAR (A) 11/19/2020 0744   APPEARANCEUR Clear 11/14/2019 1533   LABSPEC 1.029 11/19/2020 0744   PHURINE 6.0 11/19/2020 0744   GLUCOSEU >=500 (A) 11/19/2020 0744   HGBUR NEGATIVE 11/19/2020 0744   BILIRUBINUR NEGATIVE 11/19/2020 0744   BILIRUBINUR Negative 11/14/2019 1533   KETONESUR NEGATIVE 11/19/2020 0744   PROTEINUR NEGATIVE 11/19/2020 0744   UROBILINOGEN 0.2 06/07/2013 1513   NITRITE NEGATIVE 11/19/2020 0744   LEUKOCYTESUR NEGATIVE 11/19/2020 0744   Sepsis Labs Invalid input(s): PROCALCITONIN,  WBC,  LACTICIDVEN Microbiology Recent Results (from the past 240 hour(s))  SARS CORONAVIRUS 2 (TAT 6-24 HRS) Nasopharyngeal Nasopharyngeal Swab     Status: None   Collection Time: 11/19/20 10:51 AM   Specimen: Nasopharyngeal Swab  Result Value Ref Range Status   SARS Coronavirus 2 NEGATIVE NEGATIVE Final    Comment: (NOTE) SARS-CoV-2 target nucleic acids are NOT DETECTED.  The SARS-CoV-2 RNA is generally detectable in upper and lower respiratory specimens during the acute phase of infection. Negative results do not preclude SARS-CoV-2 infection, do not rule out co-infections with other pathogens, and should not be used as the sole basis for treatment or other patient management decisions. Negative  results must be combined with clinical observations, patient history, and epidemiological information. The expected result is Negative.  Fact Sheet for Patients: SugarRoll.be  Fact Sheet for Healthcare Providers: https://www.woods-mathews.com/  This test is not yet approved or cleared by the [place] FDA and  has been authorized for detection and/or diagnosis of SARS-CoV-2 by FDA under an Emergency Use Authorization (EUA). This EUA will remain  in effect (meaning this test can be used) for the duration of the COVID-19 declaration under Se ction 564(b)(1) of the Act, 21 U.S.C. section 360bbb-3(b)(1), unless the authorization is terminated or revoked sooner.  Performed at  [facility], [place] [address] [place], [place] [address]      Time coordinating discharge: Over 30 minutes  SIGNED:   Wyvonnia Dusky, MD  [facility] 11/20/2020, 2:30 PM Pager   If 7PM-7AM, please contact night-coverage

## 2020-11-20 NOTE — Telephone Encounter (Signed)
Melissa-schedule appointment- on 4/01-at 9: MD; no labs. Re: pancreatic mass/ hospital follow up. Thanks GB

## 2020-11-20 NOTE — Procedures (Signed)
Interventional Radiology Procedure Note  Procedure: US Guided Biopsy of right lobe liver lesion  Complications: None  Estimated Blood Loss: < 10 mL  Findings: 18 G core biopsy of right lobe liver mass performed under US guidance.  Three core passes yielded 2 intact core samples.  Venetia Night. Kathlene Cote, M.D Pager:  [phone]

## 2020-11-20 NOTE — Progress Notes (Signed)
Dan [name]   DOB:07/05/1966   EY#:[id number]    Subjective: No acute events overnight.  Patient had intermittent abdominal pain needing morphine.  Patient is awaiting liver biopsy this morning.  Currently n.p.o.  Objective:  Vitals:   11/20/20 0453 11/20/20 0720  BP: 119/82 136/86  Pulse: 71 79  Resp: 17 16  Temp: 98.6 F (37 C) 98.7 F (37.1 C)  SpO2: 98% 96%     Intake/Output Summary (Last 24 hours) at 11/20/2020 0855 Last data filed at 11/19/2020 1358 Gross per 24 hour  Intake 120 ml  Output --  Net 120 ml    Physical Exam HENT:     Head: Normocephalic and atraumatic.     Mouth/Throat:     Pharynx: No oropharyngeal exudate.  Eyes:     Pupils: Pupils are equal, round, and reactive to light.  Cardiovascular:     Rate and Rhythm: Normal rate and regular rhythm.  Pulmonary:     Effort: Pulmonary effort is normal. No respiratory distress.     Breath sounds: Normal breath sounds. No wheezing.  Abdominal:     General: Bowel sounds are normal. There is no distension.     Palpations: Abdomen is soft. There is no mass.     Tenderness: There is no abdominal tenderness. There is no guarding or rebound.  Musculoskeletal:        General: No tenderness. Normal range of motion.     Cervical back: Normal range of motion and neck supple.  Skin:    General: Skin is warm.  Neurological:     Mental Status: He is alert and oriented to person, place, and time.  Psychiatric:        Mood and Affect: Affect normal.      Labs:  Lab Results  Component Value Date   WBC 4.9 11/20/2020   HGB 10.5 (L) 11/20/2020   HCT 32.5 (L) 11/20/2020   MCV 83.5 11/20/2020   PLT 199 11/20/2020   NEUTROABS 2.5 06/07/2013    Lab Results  Component Value Date   NA 138 11/20/2020   K 3.7 11/20/2020   CL 103 11/20/2020   CO2 28 11/20/2020    Studies:  CT Abdomen Pelvis W Contrast  Result Date: 11/19/2020 CLINICAL DATA:  Mid to lower abdominal pain for several days, worse with inspiration.  EXAM: CT ABDOMEN AND PELVIS WITH CONTRAST TECHNIQUE: Multidetector CT imaging of the abdomen and pelvis was performed using the standard protocol following bolus administration of intravenous contrast. CONTRAST:  121mL OMNIPAQUE IOHEXOL 300 MG/ML  SOLN COMPARISON:  05/18/2013 CT abdomen/pelvis. FINDINGS: Lower chest: No significant pulmonary nodules or acute consolidative airspace disease. Hepatobiliary: Hypodense 6.3 x 4.9 cm peripheral segment 7 right liver mass (series 2/image 19), new. No additional liver masses. Normal gallbladder with no radiopaque cholelithiasis. No biliary ductal dilatation. Pancreas: Heterogeneous hypodense 8.2 x 8.1 cm pancreatic tail mass (series 2/image 24), new. No pancreatic duct dilation. Spleen: Normal size. No mass. Adrenals/Urinary Tract: Left adrenal 1.9 cm nodule with density 55 HU, new. No right adrenal nodules. Normal kidneys with no hydronephrosis and no renal mass. Normal bladder. Stomach/Bowel: Normal non-distended stomach. Normal caliber small bowel with no small bowel wall thickening. Normal appendix. Scattered mild to moderate colonic diverticulosis with no large bowel wall thickening or significant pericolonic fat stranding. Vascular/Lymphatic: Atherosclerotic nonaneurysmal abdominal aorta. Patent portal, splenic, hepatic and renal veins. Enlarged 2.4 cm short axis diameter peripancreatic lymph node adjacent to the pancreatic tail (series 2/image 25). No additional  pathologically enlarged lymph nodes in the abdomen or pelvis. Reproductive: Normal size prostate. Other: No pneumoperitoneum, ascites or focal fluid collection. Musculoskeletal: Dense patchy sclerosis in the L5 vertebral body, increased from prior. IMPRESSION: 1. Heterogeneous hypodense 8.2 x 8.1 cm pancreatic tail mass, new, suspicious for primary pancreatic malignancy. MRI abdomen without and with IV contrast recommended for further evaluation. 2. Adjacent peripancreatic lymphadenopathy, suspicious for local  nodal metastasis. 3. New hypodense 6.3 cm peripheral segment 7 right liver mass, suspicious for liver metastasis. 4. New left adrenal nodule, suspicious for adrenal metastasis. 5. Dense patchy sclerosis in the L5 vertebral body, increased from prior, indeterminate for bone metastasis versus Paget's disease. 6. Aortic Atherosclerosis (ICD10-I70.0). Electronically Signed   By: Ilona Sorrel M.D.   On: 11/19/2020 08:58    Pancreatic mass #55 year old male patient with history of diabetes currently admitted to the hospital  for worsening abdominal pain/nausea vomiting; CT scan shows pancreatic mass liver lesion/adrenal mass.  #Pancreatic head mass/liver lesion adrenal mass; question T5 vertebral lesion-highly concerning for metastatic malignancy-patient awaiting liver biopsy this morning.  Awaiting CA 19-9.  #Abdominal pain secondary malignancy-patient needed 10 mg of IV morphine/24 hours yesterday.  Patient could be transitioned to Percocet oral-post biopsy.   #Diabetes-poorly controlled Dan [name] 2022- HbA1c-8.7]-monitor closely-especially while on treatments.   #Discussed with the patient that we will plan to follow-up later in the week at the cancer center to discuss the pathology results/pain control/treatment options.  Patient in agreement.    [name], MD 11/20/2020  8:55 AM

## 2020-11-23 ENCOUNTER — Telehealth: Payer: Self-pay

## 2020-11-23 ENCOUNTER — Encounter: Payer: Self-pay | Admitting: Internal Medicine

## 2020-11-23 ENCOUNTER — Inpatient Hospital Stay: Payer: BLUE CROSS/BLUE SHIELD | Attending: Internal Medicine | Admitting: Internal Medicine

## 2020-11-23 ENCOUNTER — Encounter: Payer: Self-pay | Admitting: *Deleted

## 2020-11-23 ENCOUNTER — Other Ambulatory Visit: Payer: Self-pay

## 2020-11-23 ENCOUNTER — Telehealth: Payer: Self-pay | Admitting: *Deleted

## 2020-11-23 DIAGNOSIS — Z452 Encounter for adjustment and management of vascular access device: Secondary | ICD-10-CM | POA: Insufficient documentation

## 2020-11-23 DIAGNOSIS — C787 Secondary malignant neoplasm of liver and intrahepatic bile duct: Secondary | ICD-10-CM | POA: Diagnosis not present

## 2020-11-23 DIAGNOSIS — Z5111 Encounter for antineoplastic chemotherapy: Secondary | ICD-10-CM | POA: Diagnosis not present

## 2020-11-23 DIAGNOSIS — Z5189 Encounter for other specified aftercare: Secondary | ICD-10-CM | POA: Diagnosis not present

## 2020-11-23 DIAGNOSIS — Z95828 Presence of other vascular implants and grafts: Secondary | ICD-10-CM

## 2020-11-23 DIAGNOSIS — T451X5A Adverse effect of antineoplastic and immunosuppressive drugs, initial encounter: Secondary | ICD-10-CM

## 2020-11-23 DIAGNOSIS — R112 Nausea with vomiting, unspecified: Secondary | ICD-10-CM

## 2020-11-23 DIAGNOSIS — E119 Type 2 diabetes mellitus without complications: Secondary | ICD-10-CM | POA: Diagnosis not present

## 2020-11-23 DIAGNOSIS — C252 Malignant neoplasm of tail of pancreas: Secondary | ICD-10-CM | POA: Insufficient documentation

## 2020-11-23 DIAGNOSIS — C259 Malignant neoplasm of pancreas, unspecified: Secondary | ICD-10-CM | POA: Insufficient documentation

## 2020-11-23 MED ORDER — MORPHINE SULFATE ER 30 MG PO TBCR: 30.0000 mg | EXTENDED_RELEASE_TABLET | Freq: Two times a day (BID) | ORAL | 0 refills | Status: DC

## 2020-11-23 MED ORDER — ONDANSETRON HCL 8 MG PO TABS: 8.0000 mg | ORAL_TABLET | Freq: Three times a day (TID) | ORAL | 3 refills | Status: DC | PRN

## 2020-11-23 MED ORDER — OXYCODONE-ACETAMINOPHEN 7.5-325 MG PO TABS: ORAL_TABLET | ORAL | 0 refills | Status: DC

## 2020-11-23 MED ORDER — LIDOCAINE-PRILOCAINE 2.5-2.5 % EX CREA: 1.0000 "application " | TOPICAL_CREAM | CUTANEOUS | 3 refills | Status: DC | PRN

## 2020-11-23 MED ORDER — PROCHLORPERAZINE MALEATE 10 MG PO TABS: 10.0000 mg | ORAL_TABLET | Freq: Four times a day (QID) | ORAL | 3 refills | Status: DC | PRN

## 2020-11-23 NOTE — Assessment & Plan Note (Addendum)
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  Discussed with pathology-liver biopsy positive for malignancy based on preliminary pathology.  CA 19-9 normal. ? Acinar type, but lipase is normal. Stains pending-to decide on the histology.   #Discussed with patient and wife that unfortunately given metastatic disease to the liver-malignancy is incurable.  Treatment is usually chemotherapy.  The type of chemotherapy-will depend upon the histology.  Recommend port placement-anticipation for chemotherapy.  # Pain control: Pain not well controlled on short-acting pain medication.  Recommend adding long-acting pain medication MS Contin 30 mg every 12 hours; continue Percocet 7.5 every 6-8 hours as needed.  Discussed regarding avoiding constipation; avoiding driving while on pain medication.  #Diabetes-continue current medication.  Need to monitor closely on chemotherapy.  #Work note given; await on final pathology for the critical illness letter.  # 40 minutes face-to-face with the patient discussing the above plan of care; more than 50% of time spent on prognosis/ natural history; counseling and coordination.  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

## 2020-11-23 NOTE — Progress Notes (Signed)
Having to double up on pain medication. State that the current strength is not strong enough but does help when doubling the medication. States that urine is a [place] color which has been going on since last Friday.

## 2020-11-23 NOTE — Progress Notes (Signed)
[facility] NOTE  Patient Care Team: [facility] as PCP - General Clent Jacks, RN as Oncology Nurse Navigator  CHIEF COMPLAINTS/PURPOSE OF CONSULTATION: Pancreatic cancer   Oncology History   No history exists.     HISTORY OF PRESENTING ILLNESS:  Dan [name] 55 y.o.  male with history of diabetes was recently admitted to hospital for for worsening abdominal pain on CT scan noted to have pancreatic mass/liver lesion noted on CT imaging.  The CT scan shows-approximately 8cm centimeter pancreatic mass; adrenal lesion; also approximately 6 cm liver lesion.  Patient underwent liver biopsy in the hospital.  Patient is here today to review the results of the pathology  Continues to have abdominal pain; currently narcotic pain medication.  Mild nausea no vomiting.  Review of Systems  Constitutional: Negative for chills, diaphoresis, fever, malaise/fatigue and weight loss.  HENT: Negative for nosebleeds and sore throat.   Eyes: Negative for double vision.  Respiratory: Negative for cough, hemoptysis, sputum production, shortness of breath and wheezing.   Cardiovascular: Negative for chest pain, palpitations, orthopnea and leg swelling.  Gastrointestinal: Negative for abdominal pain, blood in stool, constipation, diarrhea, heartburn, melena, nausea and vomiting.  Genitourinary: Negative for dysuria, frequency and urgency.  Musculoskeletal: Negative for back pain and joint pain.  Skin: Negative.  Negative for itching and rash.  Neurological: Negative for dizziness, tingling, focal weakness, weakness and headaches.  Endo/Heme/Allergies: Does not bruise/bleed easily.  Psychiatric/Behavioral: Negative for depression. The patient is not nervous/anxious and does not have insomnia.      MEDICAL HISTORY:  Past Medical History:  Diagnosis Date  . Acid reflux   . Asthma   . Diabetes mellitus without complication ([place])   . History of kidney stones   .  Hypertension   . Kidney stones     SURGICAL HISTORY: Past Surgical History:  Procedure Laterality Date  . COLONOSCOPY WITH PROPOFOL N/A 12/02/2019   Procedure: COLONOSCOPY WITH PROPOFOL;  Surgeon: [name], MD;  Location: [facility] ENDOSCOPY;  Service: Gastroenterology;  Laterality: N/A;  . FRACTURE SURGERY Left at age 29   pelvic fracture  . HEMORRHOID SURGERY N/A 01/31/2020   Procedure: HEMORRHOIDECTOMY;  Surgeon: Jules Husbands, MD;  Location: [facility] ORS;  Service: General;  Laterality: N/A;  . pelvis fractur    . ROTATOR CUFF REPAIR      SOCIAL HISTORY: Social History   Socioeconomic History  . Marital status: Married    Spouse name: Not on file  . Number of children: Not on file  . Years of education: Not on file  . Highest education level: Not on file  Occupational History  . Not on file  Tobacco Use  . Smoking status: Former Smoker    Packs/day: 0.50    Years: 25.00    Pack years: 12.50    Types: Cigarettes  . Smokeless tobacco: Never Used  Vaping Use  . Vaping Use: Never used  Substance and Sexual Activity  . Alcohol use: Yes    Alcohol/week: 6.0 standard drinks    Types: 6 Cans of beer per week    Comment: weekends only  . Drug use: No  . Sexual activity: Yes  Other Topics Concern  . Not on file  Social History Narrative   Works at [employer]. In [place]. Quit smoking 2021 June. Social. Alcohol.    Social Determinants of Health   Financial Resource Strain: Not on file  Food Insecurity: Not on file  Transportation Needs: Not on file  Physical Activity: Not on file  Stress: Not on file  Social Connections: Not on file  Intimate Partner Violence: Not on file    FAMILY HISTORY: Family History  Problem Relation Age of Onset  . Cancer Sister        2 sisters 58 and 42- both died of breast cancer  . Diabetes Brother   . Hypertension Brother   . Heart attack Father   . Asthma Son     ALLERGIES:  is allergic to cherry, ibuprofen, other, and  tramadol.  MEDICATIONS:  Current Outpatient Medications  Medication Sig Dispense Refill  . amLODipine (NORVASC) 10 MG tablet TAKE 1 TABLET(10 MG) BY MOUTH DAILY (Patient taking differently: Take 10 mg by mouth daily.) 90 tablet 1  . chlorthalidone (HYGROTON) 25 MG tablet Take 25 mg by mouth every morning.    Marland Kitchen JARDIANCE 10 MG TABS tablet Take 10 mg by mouth daily.    Marland Kitchen losartan (COZAAR) 100 MG tablet TAKE 1 TABLET(100 MG) BY MOUTH DAILY (Patient taking differently: Take 100 mg by mouth daily.) 90 tablet 1  . morphine (MS CONTIN) 30 MG 12 hr tablet Take 1 tablet (30 mg total) by mouth every 12 (twelve) hours. 30 tablet 0  . omeprazole (PRILOSEC) 40 MG capsule Take 1 capsule (40 mg total) by mouth daily. 30 capsule 3  . oxyCODONE-acetaminophen (PERCOCET) 7.5-325 MG tablet 1 pill every 6-8 hours as needed for pain 60 tablet 0  . lidocaine-prilocaine (EMLA) cream Apply 1 application topically as needed (prior to port a cath needle access on the days of chemotherapy). 30 g 3  . ondansetron (ZOFRAN) 8 MG tablet Take 1 tablet (8 mg total) by mouth every 8 (eight) hours as needed for nausea, vomiting or refractory nausea / vomiting. 20 tablet 3  . prochlorperazine (COMPAZINE) 10 MG tablet Take 1 tablet (10 mg total) by mouth every 6 (six) hours as needed for nausea or vomiting. 30 tablet 3   No current facility-administered medications for this visit.      Marland Kitchen  PHYSICAL EXAMINATION: ECOG PERFORMANCE STATUS: 1 - Symptomatic but completely ambulatory  Vitals:   11/23/20 0901  BP: (!) 133/96  Pulse: 89  Resp: 16  Temp: 97.6 F (36.4 C)  SpO2: 98%   Filed Weights   11/23/20 0901  Weight: 211 lb (95.7 kg)    Physical Exam Constitutional:      Comments: Accompanied by wife.  Ambulating independently.  HENT:     Head: Normocephalic and atraumatic.     Mouth/Throat:     Pharynx: No oropharyngeal exudate.  Eyes:     Pupils: Pupils are equal, round, and reactive to light.  Cardiovascular:      Rate and Rhythm: Normal rate and regular rhythm.  Pulmonary:     Effort: Pulmonary effort is normal. No respiratory distress.     Breath sounds: Normal breath sounds. No wheezing.  Abdominal:     General: Bowel sounds are normal. There is no distension.     Palpations: Abdomen is soft. There is no mass.     Tenderness: There is no abdominal tenderness. There is no guarding or rebound.  Musculoskeletal:        General: No tenderness. Normal range of motion.     Cervical back: Normal range of motion and neck supple.  Skin:    General: Skin is warm.  Neurological:     Mental Status: He is alert and oriented to person, place, and time.  Psychiatric:  Mood and Affect: Affect normal.      LABORATORY DATA:  I have reviewed the data as listed Lab Results  Component Value Date   WBC 4.9 11/20/2020   HGB 10.5 (L) 11/20/2020   HCT 32.5 (L) 11/20/2020   MCV 83.5 11/20/2020   PLT 199 11/20/2020   Recent Labs    01/27/20 1012 05/08/20 1955 05/09/20 0921 11/19/20 0745 11/20/20 0514  NA 140 132* 135 136 138  K 3.9 4.0 4.0 3.1* 3.7  CL 105 93* 95* 100 103  CO2 27 28 29 24 28   GLUCOSE 106* 592* 449* 172* 148*  BUN 13 26* 22* 18 15  CREATININE 0.90 1.14 1.07 1.10 1.09  CALCIUM 9.1 9.8 9.6 9.2 9.2  GFRNONAA >60 >60 >60 >60 >60  GFRAA >60 >60 >60  --   --   PROT  --   --   --  8.1  --   ALBUMIN  --   --   --  4.0  --   AST  --   --   --  51*  --   ALT  --   --   --  44  --   ALKPHOS  --   --   --  89  --   BILITOT  --   --   --  0.9  --     RADIOGRAPHIC STUDIES: I have personally reviewed the radiological images as listed and agreed with the findings in the report. CT Abdomen Pelvis W Contrast  Result Date: 11/19/2020 CLINICAL DATA:  Mid to lower abdominal pain for several days, worse with inspiration. EXAM: CT ABDOMEN AND PELVIS WITH CONTRAST TECHNIQUE: Multidetector CT imaging of the abdomen and pelvis was performed using the standard protocol following bolus  administration of intravenous contrast. CONTRAST:  158mL OMNIPAQUE IOHEXOL 300 MG/ML  SOLN COMPARISON:  05/18/2013 CT abdomen/pelvis. FINDINGS: Lower chest: No significant pulmonary nodules or acute consolidative airspace disease. Hepatobiliary: Hypodense 6.3 x 4.9 cm peripheral segment 7 right liver mass (series 2/image 19), new. No additional liver masses. Normal gallbladder with no radiopaque cholelithiasis. No biliary ductal dilatation. Pancreas: Heterogeneous hypodense 8.2 x 8.1 cm pancreatic tail mass (series 2/image 24), new. No pancreatic duct dilation. Spleen: Normal size. No mass. Adrenals/Urinary Tract: Left adrenal 1.9 cm nodule with density 55 HU, new. No right adrenal nodules. Normal kidneys with no hydronephrosis and no renal mass. Normal bladder. Stomach/Bowel: Normal non-distended stomach. Normal caliber small bowel with no small bowel wall thickening. Normal appendix. Scattered mild to moderate colonic diverticulosis with no large bowel wall thickening or significant pericolonic fat stranding. Vascular/Lymphatic: Atherosclerotic nonaneurysmal abdominal aorta. Patent portal, splenic, hepatic and renal veins. Enlarged 2.4 cm short axis diameter peripancreatic lymph node adjacent to the pancreatic tail (series 2/image 25). No additional pathologically enlarged lymph nodes in the abdomen or pelvis. Reproductive: Normal size prostate. Other: No pneumoperitoneum, ascites or focal fluid collection. Musculoskeletal: Dense patchy sclerosis in the L5 vertebral body, increased from prior. IMPRESSION: 1. Heterogeneous hypodense 8.2 x 8.1 cm pancreatic tail mass, new, suspicious for primary pancreatic malignancy. MRI abdomen without and with IV contrast recommended for further evaluation. 2. Adjacent peripancreatic lymphadenopathy, suspicious for local nodal metastasis. 3. New hypodense 6.3 cm peripheral segment 7 right liver mass, suspicious for liver metastasis. 4. New left adrenal nodule, suspicious for  adrenal metastasis. 5. Dense patchy sclerosis in the L5 vertebral body, increased from prior, indeterminate for bone metastasis versus Paget's disease. 6. Aortic Atherosclerosis (ICD10-I70.0). Electronically Signed  By: Ilona Sorrel M.D.   On: 11/19/2020 08:58   US BIOPSY (LIVER)  Result Date: 11/20/2020 INDICATION: 8 cm mass of the pancreatic tail and associated 6 cm mass within the liver. The patient presents for liver lesion biopsy. EXAM: ULTRASOUND GUIDED CORE BIOPSY OF LIVER MEDICATIONS: None. ANESTHESIA/SEDATION: Fentanyl 50 mcg IV; Versed 1.0 mg IV Moderate Sedation Time:  24 minutes. The patient was continuously monitored during the procedure by the interventional radiology nurse under my direct supervision. PROCEDURE: The procedure, risks, benefits, and alternatives were explained to the patient. Questions regarding the procedure were encouraged and answered. The patient understands and consents to the procedure. A time-out was performed prior to initiating the procedure. Ultrasound was performed to localize a lesion within the right lobe of the liver. The right lateral abdominal wall was prepped with chlorhexidine in a sterile fashion, and a sterile drape was applied covering the operative field. A sterile gown and sterile gloves were used for the procedure. Local anesthesia was provided with 1% Lidocaine. Under direct ultrasound guidance, a 17 gauge trocar needle was advanced to the level of the lesion. Three passes were obtained through the lesion with an 18 gauge core biopsy device. Tissue was submitted in formalin. Gel-Foam pledgets were advanced through the outer needle as the needle was removed and retracted. COMPLICATIONS: None immediate. FINDINGS: Solid hypoechoic mass within the lateral aspect of the right lobe of the liver measures approximately 6.9 x 4.5 x 5.5 cm. Solid tissue was obtained. IMPRESSION: Ultrasound-guided core biopsy performed of a mass within the lateral right lobe of the  liver measuring 6.9 cm in greatest diameter by ultrasound. Electronically Signed   By: Aletta Edouard M.D.   On: 11/20/2020 12:37    ASSESSMENT & PLAN:   Pancreatic cancer metastasized to liver [facility]) #Pancreatic tail mass-with metastasis to liver.  Discussed with pathology-liver biopsy positive for malignancy based on preliminary pathology.  CA 19-9 normal. ? Acinar type, but lipase is normal. Stains pending-to decide on the histology.   #Discussed with patient and wife that unfortunately given metastatic disease to the liver-malignancy is incurable.  Treatment is usually chemotherapy.  The type of chemotherapy-will depend upon the histology.  Recommend port placement-anticipation for chemotherapy.  # Pain control: Pain not well controlled on short-acting pain medication.  Recommend adding long-acting pain medication MS Contin 30 mg every 12 hours; continue Percocet 7.5 every 6-8 hours as needed.  Discussed regarding avoiding constipation; avoiding driving while on pain medication.  #Diabetes-continue current medication.  Need to monitor closely on chemotherapy.  #Work note given; await on final pathology for the critical illness letter.  # 40 minutes face-to-face with the patient discussing the above plan of care; more than 50% of time spent on prognosis/ natural history; counseling and coordination.  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 The patient knows to call the clinic with any problems, questions or concerns.    [name], MD 11/28/2020 7:22 AM

## 2020-11-23 NOTE — Telephone Encounter (Signed)
Porta cath to be placed on Friday 4/8. Arrival at 10 am [facility] mall. Patient needs to be NPO 6-8 hours prior to the procedure. He will need to bring a driver prior to this apt. Pt contacted with the above information.  Patient aware that prescription for emla cream/ zofran/ compazine

## 2020-11-23 NOTE — Progress Notes (Signed)
Briefly met with Mr. Veronica. Introduced Therapist, nutritional and provided my contact information for future needs.

## 2020-11-23 NOTE — Telephone Encounter (Signed)
PA done for morphine 30 mg. Waiting on response.   TEL:[id number]

## 2020-11-26 ENCOUNTER — Other Ambulatory Visit: Payer: Self-pay | Admitting: Radiology

## 2020-11-26 NOTE — Telephone Encounter (Signed)
PA for morphine was approved. Pharmacy and patient is aware.

## 2020-11-27 ENCOUNTER — Telehealth: Payer: Self-pay | Admitting: *Deleted

## 2020-11-27 NOTE — Telephone Encounter (Signed)
Patient aware that final path is not back. We are unable to complete his critical illness policy forms until final path is back.

## 2020-11-28 ENCOUNTER — Telehealth: Payer: Self-pay | Admitting: *Deleted

## 2020-11-28 ENCOUNTER — Other Ambulatory Visit: Payer: Self-pay | Admitting: Radiology

## 2020-11-28 ENCOUNTER — Telehealth: Payer: Self-pay | Admitting: Internal Medicine

## 2020-11-28 ENCOUNTER — Other Ambulatory Visit: Admission: RE | Admit: 2020-11-28 | Payer: BLUE CROSS/BLUE SHIELD | Source: Ambulatory Visit

## 2020-11-28 NOTE — Telephone Encounter (Signed)
On 04/06-spoke to patient regarding the pending results of the pathology., will inform pt of the plan after pathology available.  GB

## 2020-11-28 NOTE — Telephone Encounter (Signed)
Patient called requesting an update for his pathology reports. I called the main path #. The path dept had 'stains' pending. Two of the stains are resulted, but they are waiting on 1 more stain to result. The path report should be back by tom. Afternoon. Patient wants to make sure he needs the port placement. I told him that most likely the results are back tomorrow. He gave verbal understanding that we would call him tomorrow to let him know the outcome.  Dr. B said he would also personally reach out to him today.

## 2020-11-29 ENCOUNTER — Other Ambulatory Visit: Payer: Self-pay | Admitting: Pathology

## 2020-11-29 ENCOUNTER — Telehealth: Payer: Self-pay | Admitting: Internal Medicine

## 2020-11-29 NOTE — Telephone Encounter (Signed)
On 4/07- I spoke to pt re: results of the biopsy positive for a rare form of pancreactic cancer. Pt has port placement at 11am.   C- schedule follow up at 8:15am- MD; no labs- Dr.B

## 2020-11-30 ENCOUNTER — Other Ambulatory Visit: Payer: Self-pay

## 2020-11-30 ENCOUNTER — Telehealth: Payer: Self-pay

## 2020-11-30 ENCOUNTER — Encounter: Payer: Self-pay | Admitting: Internal Medicine

## 2020-11-30 ENCOUNTER — Inpatient Hospital Stay (HOSPITAL_BASED_OUTPATIENT_CLINIC_OR_DEPARTMENT_OTHER): Payer: BLUE CROSS/BLUE SHIELD | Admitting: Internal Medicine

## 2020-11-30 ENCOUNTER — Ambulatory Visit
Admission: RE | Admit: 2020-11-30 | Discharge: 2020-11-30 | Disposition: A | Discharge status: Home / Self Care | Payer: BLUE CROSS/BLUE SHIELD | Source: Ambulatory Visit | Attending: Internal Medicine | Admitting: Internal Medicine

## 2020-11-30 DIAGNOSIS — Z9102 Food additives allergy status: Secondary | ICD-10-CM | POA: Insufficient documentation

## 2020-11-30 DIAGNOSIS — Z5111 Encounter for antineoplastic chemotherapy: Secondary | ICD-10-CM | POA: Diagnosis not present

## 2020-11-30 DIAGNOSIS — Z79899 Other long term (current) drug therapy: Secondary | ICD-10-CM | POA: Diagnosis not present

## 2020-11-30 DIAGNOSIS — C259 Malignant neoplasm of pancreas, unspecified: Secondary | ICD-10-CM

## 2020-11-30 DIAGNOSIS — E119 Type 2 diabetes mellitus without complications: Secondary | ICD-10-CM | POA: Insufficient documentation

## 2020-11-30 DIAGNOSIS — C787 Secondary malignant neoplasm of liver and intrahepatic bile duct: Secondary | ICD-10-CM | POA: Diagnosis not present

## 2020-11-30 DIAGNOSIS — Z7984 Long term (current) use of oral hypoglycemic drugs: Secondary | ICD-10-CM | POA: Insufficient documentation

## 2020-11-30 DIAGNOSIS — Z886 Allergy status to analgesic agent status: Secondary | ICD-10-CM | POA: Insufficient documentation

## 2020-11-30 DIAGNOSIS — Z87891 Personal history of nicotine dependence: Secondary | ICD-10-CM | POA: Diagnosis not present

## 2020-11-30 DIAGNOSIS — Z885 Allergy status to narcotic agent status: Secondary | ICD-10-CM | POA: Insufficient documentation

## 2020-11-30 HISTORY — PX: IR IMAGING GUIDED PORT INSERTION: IMG5740

## 2020-11-30 LAB — SURGICAL PATHOLOGY

## 2020-11-30 LAB — GLUCOSE, CAPILLARY: Glucose-Capillary: 142 mg/dL — ABNORMAL HIGH (ref 70–99)

## 2020-11-30 MED ORDER — FENTANYL CITRATE (PF) 100 MCG/2ML IJ SOLN
INTRAMUSCULAR | Status: AC
  Filled 2020-11-30: qty 2

## 2020-11-30 MED ORDER — SODIUM CHLORIDE 0.9 % IV SOLN: INTRAVENOUS | Status: DC

## 2020-11-30 MED ORDER — MIDAZOLAM HCL 5 MG/5ML IJ SOLN
INTRAMUSCULAR | Status: AC
  Filled 2020-11-30: qty 5

## 2020-11-30 MED ORDER — FENTANYL CITRATE (PF) 100 MCG/2ML IJ SOLN
INTRAMUSCULAR | Status: AC | PRN
  Administered 2020-11-30: 50 ug via INTRAVENOUS

## 2020-11-30 MED ORDER — MIDAZOLAM HCL 5 MG/5ML IJ SOLN
INTRAMUSCULAR | Status: AC | PRN
  Administered 2020-11-30: 2 mg via INTRAVENOUS

## 2020-11-30 MED ORDER — FENTANYL CITRATE (PF) 100 MCG/2ML IJ SOLN
INTRAMUSCULAR | Status: AC | PRN
  Administered 2020-11-30: 25 ug via INTRAVENOUS

## 2020-11-30 NOTE — Procedures (Signed)
Interventional Radiology Procedure Note  Procedure: Single Lumen Power Port Placement    Access:  Right IJ vein.  Findings: Catheter tip positioned at SVC/RA junction. Port is ready for immediate use.   Complications: None  EBL: < 10 mL  Recommendations:  - Ok to shower in 24 hours - Do not submerge for 7 days - Routine line care   Joseph Johns T. Keinan Brouillet, M.D Pager:  [phone]   

## 2020-11-30 NOTE — H&P (Signed)
Chief Complaint: Chemotherapy access. Request is for portacath placement  Referring Physician(s): [name]  Supervising Physician: Aletta Edouard  Patient Status: Dan [name] - Out-pt  History of Present Illness: Dan [name] is a 55 y.o. male  outpatient. History of DM. Recently admitted to the hospital for abdominal pain found to metastatic pancreatic cancer ( awaiting final pathology result). Team is requesting a portacath placement for chemotherapy access. Currently without any significant complaints. Patient alert and sitting in recliner, calm and comfortable. Denies any fevers, headache, chest pain, SOB, cough, nausea, vomiting or bleeding. Patient endorses generalized abdominal pain for which her currently is taking oxycodone- aceta,omophen for. Return precautions and treatment recommendations and follow-up discussed with the patient who is agreeable with the plan.   Past Medical History:  Diagnosis Date  . Acid reflux   . Asthma   . Diabetes mellitus without complication ([place])   . History of kidney stones   . Hypertension   . Kidney stones     Past Surgical History:  Procedure Laterality Date  . COLONOSCOPY WITH PROPOFOL N/A 12/02/2019   Procedure: COLONOSCOPY WITH PROPOFOL;  Surgeon: [name], MD;  Location: [facility] ENDOSCOPY;  Service: Gastroenterology;  Laterality: N/A;  . FRACTURE SURGERY Left at age 64   pelvic fracture  . HEMORRHOID SURGERY N/A 01/31/2020   Procedure: HEMORRHOIDECTOMY;  Surgeon: Jules Husbands, MD;  Location: [facility] ORS;  Service: General;  Laterality: N/A;  . pelvis fractur    . ROTATOR CUFF REPAIR      Allergies: Cherry, Ibuprofen, Other, and Tramadol  Medications: Prior to Admission medications   Medication Sig Start Date End Date Taking? Authorizing Provider  amLODipine (NORVASC) 10 MG tablet TAKE 1 TABLET(10 MG) BY MOUTH DAILY Patient taking differently: Take 10 mg by mouth daily. 12/12/19   Volney American, PA-C   chlorthalidone (HYGROTON) 25 MG tablet Take 25 mg by mouth every morning. 09/09/20   [provider]  JARDIANCE 10 MG TABS tablet Take 10 mg by mouth daily. 11/06/20   [provider]  lidocaine-prilocaine (EMLA) cream Apply 1 application topically as needed (prior to port a cath needle access on the days of chemotherapy). 11/23/20   [name], MD  losartan (COZAAR) 100 MG tablet TAKE 1 TABLET(100 MG) BY MOUTH DAILY Patient taking differently: Take 100 mg by mouth daily. 12/12/19   Volney American, PA-C  morphine (MS CONTIN) 30 MG 12 hr tablet Take 1 tablet (30 mg total) by mouth every 12 (twelve) hours. 11/23/20   [name], MD  omeprazole (PRILOSEC) 40 MG capsule Take 1 capsule (40 mg total) by mouth daily. 09/08/19   Volney American, PA-C  ondansetron (ZOFRAN) 8 MG tablet Take 1 tablet (8 mg total) by mouth every 8 (eight) hours as needed for nausea, vomiting or refractory nausea / vomiting. 11/23/20   [name], MD  oxyCODONE-acetaminophen (PERCOCET) 7.5-325 MG tablet 1 pill every 6-8 hours as needed for pain 11/23/20   [name], MD  prochlorperazine (COMPAZINE) 10 MG tablet Take 1 tablet (10 mg total) by mouth every 6 (six) hours as needed for nausea or vomiting. 11/23/20   [name], MD     Family History  Problem Relation Age of Onset  . Cancer Sister        2 sisters 42 and 52- both died of breast cancer  . Diabetes Brother   . Hypertension Brother   . Heart attack Father   . Asthma Son  Social History   Socioeconomic History  . Marital status: Married    Spouse name: Not on file  . Number of children: Not on file  . Years of education: Not on file  . Highest education level: Not on file  Occupational History  . Not on file  Tobacco Use  . Smoking status: Former Smoker    Packs/day: 0.50    Years: 25.00    Pack years: 12.50    Types: Cigarettes  . Smokeless tobacco: Never Used  Vaping  Use  . Vaping Use: Never used  Substance and Sexual Activity  . Alcohol use: Yes    Alcohol/week: 6.0 standard drinks    Types: 6 Cans of beer per week    Comment: weekends only  . Drug use: No  . Sexual activity: Yes  Other Topics Concern  . Not on file  Social History Narrative   Works at [employer]. In [place]. Quit smoking 2021 June. Social. Alcohol.    Social Determinants of Health   Financial Resource Strain: Not on file  Food Insecurity: Not on file  Transportation Needs: Not on file  Physical Activity: Not on file  Stress: Not on file  Social Connections: Not on file     Review of Systems: A 12 point ROS discussed and pertinent positives are indicated in the HPI above.  All other systems are negative.  Review of Systems  Constitutional: Negative for fever.  HENT: Negative for congestion.   Respiratory: Negative for cough and shortness of breath.   Cardiovascular: Negative for chest pain.  Gastrointestinal: Positive for abdominal pain.  Neurological: Negative for headaches.  Psychiatric/Behavioral: Negative for behavioral problems and confusion.    Vital Signs: There were no vitals taken for this visit.  Physical Exam Vitals and nursing note reviewed.  Constitutional:      Appearance: He is well-developed.  HENT:     Head: Normocephalic.  Cardiovascular:     Rate and Rhythm: Normal rate and regular rhythm.     Heart sounds: Normal heart sounds.  Pulmonary:     Effort: Pulmonary effort is normal.     Breath sounds: Normal breath sounds.  Musculoskeletal:        General: Normal range of motion.     Cervical back: Normal range of motion.  Skin:    General: Skin is dry.  Neurological:     Mental Status: He is alert and oriented to person, place, and time.     Imaging: CT Abdomen Pelvis W Contrast  Result Date: 11/19/2020 CLINICAL DATA:  Mid to lower abdominal pain for several days, worse with inspiration. EXAM: CT ABDOMEN AND PELVIS WITH CONTRAST  TECHNIQUE: Multidetector CT imaging of the abdomen and pelvis was performed using the standard protocol following bolus administration of intravenous contrast. CONTRAST:  158mL OMNIPAQUE IOHEXOL 300 MG/ML  SOLN COMPARISON:  05/18/2013 CT abdomen/pelvis. FINDINGS: Lower chest: No significant pulmonary nodules or acute consolidative airspace disease. Hepatobiliary: Hypodense 6.3 x 4.9 cm peripheral segment 7 right liver mass (series 2/image 19), new. No additional liver masses. Normal gallbladder with no radiopaque cholelithiasis. No biliary ductal dilatation. Pancreas: Heterogeneous hypodense 8.2 x 8.1 cm pancreatic tail mass (series 2/image 24), new. No pancreatic duct dilation. Spleen: Normal size. No mass. Adrenals/Urinary Tract: Left adrenal 1.9 cm nodule with density 55 HU, new. No right adrenal nodules. Normal kidneys with no hydronephrosis and no renal mass. Normal bladder. Stomach/Bowel: Normal non-distended stomach. Normal caliber small bowel with no small bowel wall thickening. Normal appendix.  Scattered mild to moderate colonic diverticulosis with no large bowel wall thickening or significant pericolonic fat stranding. Vascular/Lymphatic: Atherosclerotic nonaneurysmal abdominal aorta. Patent portal, splenic, hepatic and renal veins. Enlarged 2.4 cm short axis diameter peripancreatic lymph node adjacent to the pancreatic tail (series 2/image 25). No additional pathologically enlarged lymph nodes in the abdomen or pelvis. Reproductive: Normal size prostate. Other: No pneumoperitoneum, ascites or focal fluid collection. Musculoskeletal: Dense patchy sclerosis in the L5 vertebral body, increased from prior. IMPRESSION: 1. Heterogeneous hypodense 8.2 x 8.1 cm pancreatic tail mass, new, suspicious for primary pancreatic malignancy. MRI abdomen without and with IV contrast recommended for further evaluation. 2. Adjacent peripancreatic lymphadenopathy, suspicious for local nodal metastasis. 3. New hypodense 6.3 cm  peripheral segment 7 right liver mass, suspicious for liver metastasis. 4. New left adrenal nodule, suspicious for adrenal metastasis. 5. Dense patchy sclerosis in the L5 vertebral body, increased from prior, indeterminate for bone metastasis versus Paget's disease. 6. Aortic Atherosclerosis (ICD10-I70.0). Electronically Signed   By: Ilona Sorrel M.D.   On: 11/19/2020 08:58   US BIOPSY (LIVER)  Result Date: 11/20/2020 INDICATION: 8 cm mass of the pancreatic tail and associated 6 cm mass within the liver. The patient presents for liver lesion biopsy. EXAM: ULTRASOUND GUIDED CORE BIOPSY OF LIVER MEDICATIONS: None. ANESTHESIA/SEDATION: Fentanyl 50 mcg IV; Versed 1.0 mg IV Moderate Sedation Time:  24 minutes. The patient was continuously monitored during the procedure by the interventional radiology nurse under my direct supervision. PROCEDURE: The procedure, risks, benefits, and alternatives were explained to the patient. Questions regarding the procedure were encouraged and answered. The patient understands and consents to the procedure. A time-out was performed prior to initiating the procedure. Ultrasound was performed to localize a lesion within the right lobe of the liver. The right lateral abdominal wall was prepped with chlorhexidine in a sterile fashion, and a sterile drape was applied covering the operative field. A sterile gown and sterile gloves were used for the procedure. Local anesthesia was provided with 1% Lidocaine. Under direct ultrasound guidance, a 17 gauge trocar needle was advanced to the level of the lesion. Three passes were obtained through the lesion with an 18 gauge core biopsy device. Tissue was submitted in formalin. Gel-Foam pledgets were advanced through the outer needle as the needle was removed and retracted. COMPLICATIONS: None immediate. FINDINGS: Solid hypoechoic mass within the lateral aspect of the right lobe of the liver measures approximately 6.9 x 4.5 x 5.5 cm. Solid tissue  was obtained. IMPRESSION: Ultrasound-guided core biopsy performed of a mass within the lateral right lobe of the liver measuring 6.9 cm in greatest diameter by ultrasound. Electronically Signed   By: Aletta Edouard M.D.   On: 11/20/2020 12:37    Labs:  CBC: Recent Labs    05/08/20 1955 05/09/20 0921 11/19/20 0745 11/20/20 0514  WBC 5.5 5.3 5.8 4.9  HGB 12.5* 13.5 11.6* 10.5*  HCT 35.2* 38.3* 34.0* 32.5*  PLT 262 249 213 199    COAGS: Recent Labs    11/19/20 1346  INR 1.1  APTT 24    BMP: Recent Labs    01/27/20 1012 05/08/20 1955 05/09/20 0921 11/19/20 0745 11/20/20 0514  NA 140 132* 135 136 138  K 3.9 4.0 4.0 3.1* 3.7  CL 105 93* 95* 100 103  CO2 27 28 29 24 28   GLUCOSE 106* 592* 449* 172* 148*  BUN 13 26* 22* 18 15  CALCIUM 9.1 9.8 9.6 9.2 9.2  CREATININE 0.90 1.14 1.07 1.10 1.09  GFRNONAA >60 >60 >60 >60 >60  GFRAA >60 >60 >60  --   --     LIVER FUNCTION TESTS: Recent Labs    11/19/20 0745  BILITOT 0.9  AST 51*  ALT 44  ALKPHOS 89  PROT 8.1  ALBUMIN 4.0     Assessment and Plan:  55 y.o. male outpatient. History of DM. Recently admitted to the hospital for abdominal pain found to metastatic pancreatic cancer  awaiting final pathology result). Team is requesting a portacath placement for chemotherapy access.   No recent pertinent imaging. Allergies include ibuprofen and tramadol. IR performed a US liver on 3.29.22. Cytology probable metastatic pancreatic carcinoma.  All labs and medications are within acceptable parameters. Patient has been NPO since midnight.    Risks and benefits of image guided port-a-catheter placement was discussed with the patient including, but not limited to bleeding, infection, pneumothorax, or fibrin sheath development and need for additional procedures.  All of the patient's questions were answered, patient is agreeable to proceed. Consent signed and in chart.   Thank you for this interesting consult.  I greatly  enjoyed meeting ALTAIR APPENZELLER and look forward to participating in their care.  A copy of this report was sent to the requesting provider on this date.  Electronically Signed: Jacqualine Mau, NP 11/30/2020, 10:10 AM   I spent a total of  30 Minutes   in face to face in clinical consultation, greater than 50% of which was counseling/coordinating care for portacath placement

## 2020-11-30 NOTE — Assessment & Plan Note (Addendum)
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  Lipase normal; CA 19-9 normal discussed the staging pathology with the patient and family in detail.  Discussed this is a rare form of pancreatic cancer which is slightly better prognosis compared to ductal carcinoma of the pancreas [5-year survival-metastatic acinar carcinoma -15 to 20% versus 5% for pancreatic adenocarcinoma].  However given metastatic disease to the liver/adrenal unlikely to be cured.  # Palliative chemotherapy could be recommended to slow down the growth of the disease. I went over the treatment regimens-FOLFIRINOX ~30% response rate.   I discussed that FOLFIRINOX chemotherapy is given every 2 weeks; discuss the potential side effects including but not limited to nausea vomiting diarrhea, sores in the mouth, hand-foot syndrome; also tingling and numbness/cold sensitivity with oxaliplatin.  # Growth factor- would be given as prophylaxis for chemotherapy-induced neutropenia to prevent febrile neutropenias.   # Pain control: Secondary malignancy stable continue Percocet 7.5 currently taking twice daily; MS Contin nausea stopped.  #Diabetes-continue current medication.  Need to monitor closely on chemotherapy.  #Had a long discussion with patient/wife regarding the difficult situation.  Discussed regarding a second opinion at tertiary center given the rare diagnosis.  Will reach out to patient's PCP re: opinion at [facility] oncology.   * Barista, PA-C; [phone] (Work)  # DISPOSITION:  # Chemo education- FOLFIRINOX next week # PET scan ASAP # follow up 4/18- MD; labs- cbc/cmp;FOLFORINOX; day-2 pump off; udenyca- Dr.B  Addendum: on 4/08-I spoke to patient's primary care physician Ms. tester regarding patient's diagnosis/plan of care.  Also discussed regarding seeking opinion with [facility] oncology; defer to PCP regarding referral.   # 40 minutes face-to-face with the patient discussing the above plan of care; more than  50% of time spent on prognosis/ natural history; counseling and coordination.

## 2020-11-30 NOTE — Telephone Encounter (Signed)
LVM with [facility] PA, patients PCP, to call back Dr. B directly in regards to patients care.

## 2020-11-30 NOTE — Progress Notes (Signed)
START ON PATHWAY REGIMEN - Pancreatic Adenocarcinoma     A cycle is every 14 days:     Oxaliplatin      Leucovorin      Irinotecan      Fluorouracil   **Always confirm dose/schedule in your pharmacy ordering system**  Patient Characteristics: Metastatic Disease, First Line, PS = 0,1, BRCA1/2 and PALB2  Mutation Absent/Unknown Therapeutic Status: Metastatic Disease Line of Therapy: First Line ECOG Performance Status: 1 BRCA1/2 Mutation Status: Awaiting Test Results PALB2 Mutation Status: Awaiting Test Results Intent of Therapy: Non-Curative / Palliative Intent, Discussed with Patient 

## 2020-11-30 NOTE — Progress Notes (Signed)
[place] CONSULT NOTE  Patient Care Team: [facility] as PCP - General Clent Jacks, RN as Oncology Nurse Navigator  CHIEF COMPLAINTS/PURPOSE OF CONSULTATION: Pancreatic cancer   Oncology History Overview Note  # April 2022- [name],  BIOPSY:  - METASTATIC PANCREATIC ACINAR CELL CARCINOMA.; STAGE IV; CT scan shows-approximately 8cm centimeter pancreatic mass; adrenal lesion; also approximately 6 cm liver lesion.   ; LIPASE NORMAL.   There is sufficient material for ancillary molecular studies if needed  (A1, A3, A4, A2)   # NGS/MOLECULAR TESTS:    # PALLIATIVE CARE EVALUATION:  # PAIN MANAGEMENT:    DIAGNOSIS:   STAGE:         ;  GOALS:  CURRENT/MOST RECENT THERAPY :     Pancreatic cancer metastasized to liver ([place])  11/23/2020 Initial Diagnosis   Pancreatic cancer metastasized to liver ([place])   11/28/2020 Cancer Staging   Staging form: Exocrine Pancreas, AJCC 8th Edition - Clinical: Stage IV (cM1) - Signed by [name], MD on 11/28/2020 Histopathologic type: Acinar cell carcinoma   12/10/2020 -  Chemotherapy    Patient is on Treatment Plan: PANCREAS MODIFIED FOLFIRINOX Q14D X 4 CYCLES         HISTORY OF PRESENTING ILLNESS:  Dan [name] 55 y.o.  male with history of diabetes was recently admitted to hospital for for worsening abdominal pain on CT scan noted to have pancreatic mass/liver lesion noted on CT imaging/is here to discuss the pathology/treatment options  Patient is due for his Mediport later in the morning today.  He continues abdominal pain; currently on narcotic pain medication. Mild nausea -with morphine long-acting.  Stopped taking MS Contin.  Patient currently taking Percocet 7.5 up to 2 a day.   Review of Systems  Constitutional: Negative for chills, diaphoresis, fever, malaise/fatigue and weight loss.  HENT: Negative for nosebleeds and sore throat.   Eyes: Negative for double vision.  Respiratory: Negative  for cough, hemoptysis, sputum production, shortness of breath and wheezing.   Cardiovascular: Negative for chest pain, palpitations, orthopnea and leg swelling.  Gastrointestinal: Positive for abdominal pain and nausea. Negative for blood in stool, constipation, diarrhea, heartburn, melena and vomiting.  Genitourinary: Negative for dysuria, frequency and urgency.  Musculoskeletal: Negative for back pain and joint pain.  Skin: Negative.  Negative for itching and rash.  Neurological: Negative for dizziness, tingling, focal weakness, weakness and headaches.  Endo/Heme/Allergies: Does not bruise/bleed easily.  Psychiatric/Behavioral: Negative for depression. The patient is not nervous/anxious and does not have insomnia.      MEDICAL HISTORY:  Past Medical History:  Diagnosis Date  . Acid reflux   . Asthma   . Diabetes mellitus without complication ([place])   . History of kidney stones   . Hypertension   . Kidney stones     SURGICAL HISTORY: Past Surgical History:  Procedure Laterality Date  . COLONOSCOPY WITH PROPOFOL N/A 12/02/2019   Procedure: COLONOSCOPY WITH PROPOFOL;  Surgeon: [name], MD;  Location: [facility] ENDOSCOPY;  Service: Gastroenterology;  Laterality: N/A;  . FRACTURE SURGERY Left at age 31   pelvic fracture  . HEMORRHOID SURGERY N/A 01/31/2020   Procedure: HEMORRHOIDECTOMY;  Surgeon: Jules Husbands, MD;  Location: [facility] ORS;  Service: General;  Laterality: N/A;  . IR IMAGING GUIDED PORT INSERTION  11/30/2020  . pelvis fractur    . ROTATOR CUFF REPAIR      SOCIAL HISTORY: Social History   Socioeconomic History  . Marital status: Married  Spouse name: Not on file  . Number of children: Not on file  . Years of education: Not on file  . Highest education level: Not on file  Occupational History  . Not on file  Tobacco Use  . Smoking status: Former Smoker    Packs/day: 0.50    Years: 25.00    Pack years: 12.50    Types: Cigarettes  . Smokeless tobacco: Never Used   Vaping Use  . Vaping Use: Never used  Substance and Sexual Activity  . Alcohol use: Yes    Alcohol/week: 6.0 standard drinks    Types: 6 Cans of beer per week    Comment: weekends only  . Drug use: No  . Sexual activity: Yes  Other Topics Concern  . Not on file  Social History Narrative   Works at [employer]. In [place]. Quit smoking 2021 June. Social. Alcohol.    Social Determinants of Health   Financial Resource Strain: Not on file  Food Insecurity: Not on file  Transportation Needs: Not on file  Physical Activity: Not on file  Stress: Not on file  Social Connections: Not on file  Intimate Partner Violence: Not on file    FAMILY HISTORY: Family History  Problem Relation Age of Onset  . Cancer Sister        2 sisters 66 and 79- both died of breast cancer  . Diabetes Brother   . Hypertension Brother   . Heart attack Father   . Asthma Son     ALLERGIES:  is allergic to cherry, ibuprofen, other, and tramadol.  MEDICATIONS:  Current Outpatient Medications  Medication Sig Dispense Refill  . amLODipine (NORVASC) 10 MG tablet TAKE 1 TABLET(10 MG) BY MOUTH DAILY (Patient taking differently: Take 10 mg by mouth daily.) 90 tablet 1  . chlorthalidone (HYGROTON) 25 MG tablet Take 25 mg by mouth every morning.    Marland Kitchen JARDIANCE 10 MG TABS tablet Take 10 mg by mouth daily.    Marland Kitchen lidocaine-prilocaine (EMLA) cream Apply 1 application topically as needed (prior to port a cath needle access on the days of chemotherapy). 30 g 3  . losartan (COZAAR) 100 MG tablet TAKE 1 TABLET(100 MG) BY MOUTH DAILY (Patient taking differently: Take 100 mg by mouth daily.) 90 tablet 1  . morphine (MS CONTIN) 30 MG 12 hr tablet Take 1 tablet (30 mg total) by mouth every 12 (twelve) hours. 30 tablet 0  . omeprazole (PRILOSEC) 40 MG capsule Take 1 capsule (40 mg total) by mouth daily. 30 capsule 3  . ondansetron (ZOFRAN) 8 MG tablet Take 1 tablet (8 mg total) by mouth every 8 (eight) hours as needed for nausea,  vomiting or refractory nausea / vomiting. 20 tablet 3  . oxyCODONE-acetaminophen (PERCOCET) 7.5-325 MG tablet 1 pill every 6-8 hours as needed for pain 60 tablet 0  . prochlorperazine (COMPAZINE) 10 MG tablet Take 1 tablet (10 mg total) by mouth every 6 (six) hours as needed for nausea or vomiting. 30 tablet 3   No current facility-administered medications for this visit.      Marland Kitchen  PHYSICAL EXAMINATION: ECOG PERFORMANCE STATUS: 1 - Symptomatic but completely ambulatory  Vitals:   11/30/20 0822  BP: (!) 128/96  Pulse: 91  Resp: 16  Temp: 97.7 F (36.5 C)  SpO2: 98%   Filed Weights   11/30/20 0822  Weight: 213 lb (96.6 kg)    Physical Exam Constitutional:      Comments: Accompanied by wife.  Ambulating independently.  HENT:     Head: Normocephalic and atraumatic.     Mouth/Throat:     Pharynx: No oropharyngeal exudate.  Eyes:     Pupils: Pupils are equal, round, and reactive to light.  Cardiovascular:     Rate and Rhythm: Normal rate and regular rhythm.  Pulmonary:     Effort: Pulmonary effort is normal. No respiratory distress.     Breath sounds: Normal breath sounds. No wheezing.  Abdominal:     General: Bowel sounds are normal. There is no distension.     Palpations: Abdomen is soft. There is no mass.     Tenderness: There is no abdominal tenderness. There is no guarding or rebound.  Musculoskeletal:        General: No tenderness. Normal range of motion.     Cervical back: Normal range of motion and neck supple.  Skin:    General: Skin is warm.  Neurological:     Mental Status: He is alert and oriented to person, place, and time.  Psychiatric:        Mood and Affect: Affect normal.      LABORATORY DATA:  I have reviewed the data as listed Lab Results  Component Value Date   WBC 4.9 11/20/2020   HGB 10.5 (L) 11/20/2020   HCT 32.5 (L) 11/20/2020   MCV 83.5 11/20/2020   PLT 199 11/20/2020   Recent Labs    01/27/20 1012 05/08/20 1955 05/09/20 0921  11/19/20 0745 11/20/20 0514  NA 140 132* 135 136 138  K 3.9 4.0 4.0 3.1* 3.7  CL 105 93* 95* 100 103  CO2 27 28 29 24 28   GLUCOSE 106* 592* 449* 172* 148*  BUN 13 26* 22* 18 15  CREATININE 0.90 1.14 1.07 1.10 1.09  CALCIUM 9.1 9.8 9.6 9.2 9.2  GFRNONAA >60 >60 >60 >60 >60  GFRAA >60 >60 >60  --   --   PROT  --   --   --  8.1  --   ALBUMIN  --   --   --  4.0  --   AST  --   --   --  51*  --   ALT  --   --   --  44  --   ALKPHOS  --   --   --  89  --   BILITOT  --   --   --  0.9  --     RADIOGRAPHIC STUDIES: I have personally reviewed the radiological images as listed and agreed with the findings in the report. CT Abdomen Pelvis W Contrast  Result Date: 11/19/2020 CLINICAL DATA:  Mid to lower abdominal pain for several days, worse with inspiration. EXAM: CT ABDOMEN AND PELVIS WITH CONTRAST TECHNIQUE: Multidetector CT imaging of the abdomen and pelvis was performed using the standard protocol following bolus administration of intravenous contrast. CONTRAST:  160mL OMNIPAQUE IOHEXOL 300 MG/ML  SOLN COMPARISON:  05/18/2013 CT abdomen/pelvis. FINDINGS: Lower chest: No significant pulmonary nodules or acute consolidative airspace disease. Hepatobiliary: Hypodense 6.3 x 4.9 cm peripheral segment 7 right liver mass (series 2/image 19), new. No additional liver masses. Normal gallbladder with no radiopaque cholelithiasis. No biliary ductal dilatation. Pancreas: Heterogeneous hypodense 8.2 x 8.1 cm pancreatic tail mass (series 2/image 24), new. No pancreatic duct dilation. Spleen: Normal size. No mass. Adrenals/Urinary Tract: Left adrenal 1.9 cm nodule with density 55 HU, new. No right adrenal nodules. Normal kidneys with no hydronephrosis and no renal mass. Normal bladder. Stomach/Bowel: Normal  non-distended stomach. Normal caliber small bowel with no small bowel wall thickening. Normal appendix. Scattered mild to moderate colonic diverticulosis with no large bowel wall thickening or significant  pericolonic fat stranding. Vascular/Lymphatic: Atherosclerotic nonaneurysmal abdominal aorta. Patent portal, splenic, hepatic and renal veins. Enlarged 2.4 cm short axis diameter peripancreatic lymph node adjacent to the pancreatic tail (series 2/image 25). No additional pathologically enlarged lymph nodes in the abdomen or pelvis. Reproductive: Normal size prostate. Other: No pneumoperitoneum, ascites or focal fluid collection. Musculoskeletal: Dense patchy sclerosis in the L5 vertebral body, increased from prior. IMPRESSION: 1. Heterogeneous hypodense 8.2 x 8.1 cm pancreatic tail mass, new, suspicious for primary pancreatic malignancy. MRI abdomen without and with IV contrast recommended for further evaluation. 2. Adjacent peripancreatic lymphadenopathy, suspicious for local nodal metastasis. 3. New hypodense 6.3 cm peripheral segment 7 right liver mass, suspicious for liver metastasis. 4. New left adrenal nodule, suspicious for adrenal metastasis. 5. Dense patchy sclerosis in the L5 vertebral body, increased from prior, indeterminate for bone metastasis versus Paget's disease. 6. Aortic Atherosclerosis (ICD10-I70.0). Electronically Signed   By: Ilona Sorrel M.D.   On: 11/19/2020 08:58   US BIOPSY (LIVER)  Result Date: 11/20/2020 INDICATION: 8 cm mass of the pancreatic tail and associated 6 cm mass within the liver. The patient presents for liver lesion biopsy. EXAM: ULTRASOUND GUIDED CORE BIOPSY OF LIVER MEDICATIONS: None. ANESTHESIA/SEDATION: Fentanyl 50 mcg IV; Versed 1.0 mg IV Moderate Sedation Time:  24 minutes. The patient was continuously monitored during the procedure by the interventional radiology nurse under my direct supervision. PROCEDURE: The procedure, risks, benefits, and alternatives were explained to the patient. Questions regarding the procedure were encouraged and answered. The patient understands and consents to the procedure. A time-out was performed prior to initiating the procedure.  Ultrasound was performed to localize a lesion within the right lobe of the liver. The right lateral abdominal wall was prepped with chlorhexidine in a sterile fashion, and a sterile drape was applied covering the operative field. A sterile gown and sterile gloves were used for the procedure. Local anesthesia was provided with 1% Lidocaine. Under direct ultrasound guidance, a 17 gauge trocar needle was advanced to the level of the lesion. Three passes were obtained through the lesion with an 18 gauge core biopsy device. Tissue was submitted in formalin. Gel-Foam pledgets were advanced through the outer needle as the needle was removed and retracted. COMPLICATIONS: None immediate. FINDINGS: Solid hypoechoic mass within the lateral aspect of the right lobe of the liver measures approximately 6.9 x 4.5 x 5.5 cm. Solid tissue was obtained. IMPRESSION: Ultrasound-guided core biopsy performed of a mass within the lateral right lobe of the liver measuring 6.9 cm in greatest diameter by ultrasound. Electronically Signed   By: Aletta Edouard M.D.   On: 11/20/2020 12:37   IR IMAGING GUIDED PORT INSERTION  Result Date: 11/30/2020 CLINICAL DATA:  Metastatic pancreatic carcinoma and need for porta cath to begin chemotherapy. EXAM: IMPLANTED PORT A CATH PLACEMENT WITH ULTRASOUND AND FLUOROSCOPIC GUIDANCE ANESTHESIA/SEDATION: 2.0 mg IV Versed; 75 mcg IV Fentanyl Total Moderate Sedation Time:  28 minutes The patient's level of consciousness and physiologic status were continuously monitored during the procedure by Radiology nursing. FLUOROSCOPY TIME:  18 seconds.  2.7 mGy. PROCEDURE: The procedure, risks, benefits, and alternatives were explained to the patient. Questions regarding the procedure were encouraged and answered. The patient understands and consents to the procedure. A time-out was performed prior to initiating the procedure. Ultrasound was utilized to confirm patency of  the right internal jugular vein. The right  neck and chest were prepped with chlorhexidine in a sterile fashion, and a sterile drape was applied covering the operative field. Maximum barrier sterile technique with sterile gowns and gloves were used for the procedure. Local anesthesia was provided with 1% lidocaine. After creating a small venotomy incision, a 21 gauge needle was advanced into the right internal jugular vein under direct, real-time ultrasound guidance. Ultrasound image documentation was performed. After securing guidewire access, an 8 Fr dilator was placed. A J-wire was kinked to measure appropriate catheter length. A subcutaneous port pocket was then created along the upper chest wall utilizing sharp and blunt dissection. Portable cautery was utilized. The pocket was irrigated with sterile saline. A single lumen power injectable port was chosen for placement. The 8 Fr catheter was tunneled from the port pocket site to the venotomy incision. The port was placed in the pocket. External catheter was trimmed to appropriate length based on guidewire measurement. At the venotomy, an 8 Fr peel-away sheath was placed over a guidewire. The catheter was then placed through the sheath and the sheath removed. Final catheter positioning was confirmed and documented with a fluoroscopic spot image. The port was accessed with a needle and aspirated and flushed with heparinized saline. The access needle was removed. The venotomy and port pocket incisions were closed with subcutaneous 3-0 Monocryl and subcuticular 4-0 Vicryl. Dermabond was applied to both incisions. COMPLICATIONS: COMPLICATIONS None FINDINGS: After catheter placement, the tip lies at the cavo-atrial junction. The catheter aspirates normally and is ready for immediate use. IMPRESSION: Placement of single lumen port a cath via right internal jugular vein. The catheter tip lies at the cavo-atrial junction. A power injectable port a cath was placed and is ready for immediate use. Electronically  Signed   By: Aletta Edouard M.D.   On: 11/30/2020 13:51    ASSESSMENT & PLAN:   Pancreatic cancer metastasized to liver [facility]) #Pancreatic acinar cell cancer with metastasis to liver.  Lipase normal; CA 19-9 normal discussed the staging pathology with the patient and family in detail.  Discussed this is a rare form of pancreatic cancer which is slightly better prognosis compared to ductal carcinoma of the pancreas [5-year survival-metastatic acinar carcinoma -15 to 20% versus 5% for pancreatic adenocarcinoma].  However given metastatic disease to the liver/adrenal unlikely to be cured.  # Palliative chemotherapy could be recommended to slow down the growth of the disease. I went over the treatment regimens-FOLFIRINOX ~30% response rate.   I discussed that FOLFIRINOX chemotherapy is given every 2 weeks; discuss the potential side effects including but not limited to nausea vomiting diarrhea, sores in the mouth, hand-foot syndrome; also tingling and numbness/cold sensitivity with oxaliplatin.  # Growth factor- would be given as prophylaxis for chemotherapy-induced neutropenia to prevent febrile neutropenias.   # Pain control: Secondary malignancy stable continue Percocet 7.5 currently taking twice daily; MS Contin nausea stopped.  #Diabetes-continue current medication.  Need to monitor closely on chemotherapy.  #Had a long discussion with patient/wife regarding the difficult situation.  Discussed regarding a second opinion at tertiary center given the rare diagnosis.  Will reach out to patient's PCP re: opinion at [facility] oncology.   * Barista, PA-C; [phone] (Work)  # DISPOSITION:  # Chemo education- FOLFIRINOX next week # PET scan ASAP # follow up 4/18- MD; labs- cbc/cmp;FOLFORINOX; day-2 pump off; udenyca- Dr.B  Addendum: on 4/08-I spoke to patient's primary care physician Ms. tester regarding patient's diagnosis/plan of care.  Also discussed regarding seeking opinion with [facility]  oncology; defer to PCP regarding referral.   # 40 minutes face-to-face with the patient discussing the above plan of care; more than 50% of time spent on prognosis/ natural history; counseling and coordination.    All questions were answered. The patient knows to call the clinic with any problems, questions or concerns.    [name], MD 12/04/2020 9:11 PM

## 2020-12-03 ENCOUNTER — Other Ambulatory Visit: Payer: Self-pay | Admitting: *Deleted

## 2020-12-03 ENCOUNTER — Encounter: Payer: Self-pay | Admitting: *Deleted

## 2020-12-04 ENCOUNTER — Encounter: Payer: Self-pay | Admitting: *Deleted

## 2020-12-06 ENCOUNTER — Other Ambulatory Visit: Payer: Self-pay

## 2020-12-06 ENCOUNTER — Other Ambulatory Visit: Payer: BLUE CROSS/BLUE SHIELD

## 2020-12-06 ENCOUNTER — Encounter
Admission: RE | Admit: 2020-12-06 | Discharge: 2020-12-06 | Disposition: A | Discharge status: Home / Self Care | Payer: BLUE CROSS/BLUE SHIELD | Source: Ambulatory Visit | Attending: Internal Medicine | Admitting: Internal Medicine

## 2020-12-06 DIAGNOSIS — C787 Secondary malignant neoplasm of liver and intrahepatic bile duct: Secondary | ICD-10-CM | POA: Insufficient documentation

## 2020-12-06 DIAGNOSIS — C259 Malignant neoplasm of pancreas, unspecified: Secondary | ICD-10-CM | POA: Insufficient documentation

## 2020-12-06 LAB — GLUCOSE, CAPILLARY: Glucose-Capillary: 208 mg/dL — ABNORMAL HIGH (ref 70–99)

## 2020-12-06 MED ORDER — FLUDEOXYGLUCOSE F - 18 (FDG) INJECTION
11.0000 | Freq: Once | INTRAVENOUS | Status: AC | PRN
  Administered 2020-12-06: 11.76 via INTRAVENOUS

## 2020-12-06 NOTE — Patient Instructions (Signed)
Irinotecan injection What is this medicine? IRINOTECAN (ir in oh TEE kan ) is a chemotherapy drug. It is used to treat colon and rectal cancer. This medicine may be used for other purposes; ask your health care provider or pharmacist if you have questions. COMMON BRAND NAME(S): Camptosar What should I tell my health care provider before I take this medicine? They need to know if you have any of these conditions:  dehydration  diarrhea  infection (especially a virus infection such as chickenpox, cold sores, or herpes)  liver disease  low blood counts, like low white cell, platelet, or red cell counts  low levels of calcium, magnesium, or potassium in the blood  recent or ongoing radiation therapy  an unusual or allergic reaction to irinotecan, other medicines, foods, dyes, or preservatives  pregnant or trying to get pregnant  breast-feeding How should I use this medicine? This drug is given as an infusion into a vein. It is administered in a hospital or clinic by a specially trained health care professional. Talk to your pediatrician regarding the use of this medicine in children. Special care may be needed. Overdosage: If you think you have taken too much of this medicine contact a poison control center or emergency room at once. NOTE: This medicine is only for you. Do not share this medicine with others. What if I miss a dose? It is important not to miss your dose. Call your doctor or health care professional if you are unable to keep an appointment. What may interact with this medicine? Do not take this medicine with any of the following medications:  cobicistat  itraconazole This medicine may interact with the following medications:  antiviral medicines for HIV or AIDS  certain antibiotics like rifampin or rifabutin  certain medicines for fungal infections like ketoconazole, posaconazole, and voriconazole  certain medicines for seizures like carbamazepine,  phenobarbital, phenotoin  clarithromycin  gemfibrozil  nefazodone  St. John's Wort This list may not describe all possible interactions. Give your health care provider a list of all the medicines, herbs, non-prescription drugs, or dietary supplements you use. Also tell them if you smoke, drink alcohol, or use illegal drugs. Some items may interact with your medicine. What should I watch for while using this medicine? Your condition will be monitored carefully while you are receiving this medicine. You will need important blood work done while you are taking this medicine. This drug may make you feel generally unwell. This is not uncommon, as chemotherapy can affect healthy cells as well as cancer cells. Report any side effects. Continue your course of treatment even though you feel ill unless your doctor tells you to stop. In some cases, you may be given additional medicines to help with side effects. Follow all directions for their use. You may get drowsy or dizzy. Do not drive, use machinery, or do anything that needs mental alertness until you know how this medicine affects you. Do not stand or sit up quickly, especially if you are an older patient. This reduces the risk of dizzy or fainting spells. Call your health care professional for advice if you get a fever, chills, or sore throat, or other symptoms of a cold or flu. Do not treat yourself. This medicine decreases your body's ability to fight infections. Try to avoid being around people who are sick. Avoid taking products that contain aspirin, acetaminophen, ibuprofen, naproxen, or ketoprofen unless instructed by your doctor. These medicines may hide a fever. This medicine may increase your risk  to bruise or bleed. Call your doctor or health care professional if you notice any unusual bleeding. Be careful brushing and flossing your teeth or using a toothpick because you may get an infection or bleed more easily. If you have any dental work  done, tell your dentist you are receiving this medicine. Do not become pregnant while taking this medicine or for 6 months after stopping it. Women should inform their health care professional if they wish to become pregnant or think they might be pregnant. Men should not father a child while taking this medicine and for 3 months after stopping it. There is potential for serious side effects to an unborn child. Talk to your health care professional for more information. Do not breast-feed an infant while taking this medicine or for 7 days after stopping it. This medicine has caused ovarian failure in some women. This medicine may make it more difficult to get pregnant. Talk to your health care professional if you are concerned about your fertility. This medicine has caused decreased sperm counts in some men. This may make it more difficult to father a child. Talk to your health care professional if you are concerned about your fertility. What side effects may I notice from receiving this medicine? Side effects that you should report to your doctor or health care professional as soon as possible:  allergic reactions like skin rash, itching or hives, swelling of the face, lips, or tongue  chest pain  diarrhea  flushing, runny nose, sweating during infusion  low blood counts - this medicine may decrease the number of white blood cells, red blood cells and platelets. You may be at increased risk for infections and bleeding.  nausea, vomiting  pain, swelling, warmth in the leg  signs of decreased platelets or bleeding - bruising, pinpoint red spots on the skin, black, tarry stools, blood in the urine  signs of infection - fever or chills, cough, sore throat, pain or difficulty passing urine  signs of decreased red blood cells - unusually weak or tired, fainting spells, lightheadedness Side effects that usually do not require medical attention (report to your doctor or health care professional  if they continue or are bothersome):  constipation  hair loss  headache  loss of appetite  mouth sores  stomach pain This list may not describe all possible side effects. Call your doctor for medical advice about side effects. You may report side effects to FDA at 1-800-FDA-1088. Where should I keep my medicine? This drug is given in a hospital or clinic and will not be stored at home. NOTE: This sheet is a summary. It may not cover all possible information. If you have questions about this medicine, talk to your doctor, pharmacist, or health care provider.  2021 [employer] Standard (2019-07-12 17:46:13) Oxaliplatin Injection What is this medicine? OXALIPLATIN (ox AL i PLA tin) is a chemotherapy drug. It targets fast dividing cells, like cancer cells, and causes these cells to die. This medicine is used to treat cancers of the colon and rectum, and many other cancers. This medicine may be used for other purposes; ask your health care provider or pharmacist if you have questions. COMMON BRAND NAME(S): Eloxatin What should I tell my health care provider before I take this medicine? They need to know if you have any of these conditions:  heart disease  history of irregular heartbeat  liver disease  low blood counts, like white cells, platelets, or red blood cells  lung or breathing disease,  like asthma  take medicines that treat or prevent blood clots  tingling of the fingers or toes, or other nerve disorder  an unusual or allergic reaction to oxaliplatin, other chemotherapy, other medicines, foods, dyes, or preservatives  pregnant or trying to get pregnant  breast-feeding How should I use this medicine? This drug is given as an infusion into a vein. It is administered in a hospital or clinic by a specially trained health care professional. Talk to your pediatrician regarding the use of this medicine in children. Special care may be needed. Overdosage: If you think  you have taken too much of this medicine contact a poison control center or emergency room at once. NOTE: This medicine is only for you. Do not share this medicine with others. What if I miss a dose? It is important not to miss a dose. Call your doctor or health care professional if you are unable to keep an appointment. What may interact with this medicine? Do not take this medicine with any of the following medications:  cisapride  dronedarone  pimozide  thioridazine This medicine may also interact with the following medications:  aspirin and aspirin-like medicines  certain medicines that treat or prevent blood clots like warfarin, apixaban, dabigatran, and rivaroxaban  cisplatin  cyclosporine  diuretics  medicines for infection like acyclovir, adefovir, amphotericin B, bacitracin, cidofovir, foscarnet, ganciclovir, gentamicin, pentamidine, vancomycin  NSAIDs, medicines for pain and inflammation, like ibuprofen or naproxen  other medicines that prolong the QT interval (an abnormal heart rhythm)  pamidronate  zoledronic acid This list may not describe all possible interactions. Give your health care provider a list of all the medicines, herbs, non-prescription drugs, or dietary supplements you use. Also tell them if you smoke, drink alcohol, or use illegal drugs. Some items may interact with your medicine. What should I watch for while using this medicine? Your condition will be monitored carefully while you are receiving this medicine. You may need blood work done while you are taking this medicine. This medicine may make you feel generally unwell. This is not uncommon as chemotherapy can affect healthy cells as well as cancer cells. Report any side effects. Continue your course of treatment even though you feel ill unless your healthcare professional tells you to stop. This medicine can make you more sensitive to cold. Do not drink cold drinks or use ice. Cover exposed skin  before coming in contact with cold temperatures or cold objects. When out in cold weather wear warm clothing and cover your mouth and nose to warm the air that goes into your lungs. Tell your doctor if you get sensitive to the cold. Do not become pregnant while taking this medicine or for 9 months after stopping it. Women should inform their health care professional if they wish to become pregnant or think they might be pregnant. Men should not father a child while taking this medicine and for 6 months after stopping it. There is potential for serious side effects to an unborn child. Talk to your health care professional for more information. Do not breast-feed a child while taking this medicine or for 3 months after stopping it. This medicine has caused ovarian failure in some women. This medicine may make it more difficult to get pregnant. Talk to your health care professional if you are concerned about your fertility. This medicine has caused decreased sperm counts in some men. This may make it more difficult to father a child. Talk to your health care professional if you  are concerned about your fertility. This medicine may increase your risk of getting an infection. Call your health care professional for advice if you get a fever, chills, or sore throat, or other symptoms of a cold or flu. Do not treat yourself. Try to avoid being around people who are sick. Avoid taking medicines that contain aspirin, acetaminophen, ibuprofen, naproxen, or ketoprofen unless instructed by your health care professional. These medicines may hide a fever. Be careful brushing or flossing your teeth or using a toothpick because you may get an infection or bleed more easily. If you have any dental work done, tell your dentist you are receiving this medicine. What side effects may I notice from receiving this medicine? Side effects that you should report to your doctor or health care professional as soon as  possible:  allergic reactions like skin rash, itching or hives, swelling of the face, lips, or tongue  breathing problems  cough  low blood counts - this medicine may decrease the number of white blood cells, red blood cells, and platelets. You may be at increased risk for infections and bleeding  nausea, vomiting  pain, redness, or irritation at site where injected  pain, tingling, numbness in the hands or feet  signs and symptoms of bleeding such as bloody or black, tarry stools; red or dark [place] urine; spitting up blood or [place] material that looks like coffee grounds; red spots on the skin; unusual bruising or bleeding from the eyes, gums, or nose  signs and symptoms of a dangerous change in heartbeat or heart rhythm like chest pain; dizziness; fast, irregular heartbeat; palpitations; feeling faint or lightheaded; falls  signs and symptoms of infection like fever; chills; cough; sore throat; pain or trouble passing urine  signs and symptoms of liver injury like dark yellow or [place] urine; general ill feeling or flu-like symptoms; light-colored stools; loss of appetite; nausea; right upper belly pain; unusually weak or tired; yellowing of the eyes or skin  signs and symptoms of low red blood cells or anemia such as unusually weak or tired; feeling faint or lightheaded; falls  signs and symptoms of muscle injury like dark urine; trouble passing urine or change in the amount of urine; unusually weak or tired; muscle pain; back pain Side effects that usually do not require medical attention (report to your doctor or health care professional if they continue or are bothersome):  changes in taste  diarrhea  gas  hair loss  loss of appetite  mouth sores This list may not describe all possible side effects. Call your doctor for medical advice about side effects. You may report side effects to FDA at 1-800-FDA-1088. Where should I keep my medicine? This drug is given in a  hospital or clinic and will not be stored at home. NOTE: This sheet is a summary. It may not cover all possible information. If you have questions about this medicine, talk to your doctor, pharmacist, or health care provider.  2021 [employer]/Gold Standard (2018-12-29 12:20:35) Fluorouracil, 5-FU injection What is this medicine? FLUOROURACIL, 5-FU (flure oh YOOR a sil) is a chemotherapy drug. It slows the growth of cancer cells. This medicine is used to treat many types of cancer like breast cancer, colon or rectal cancer, pancreatic cancer, and stomach cancer. This medicine may be used for other purposes; ask your health care provider or pharmacist if you have questions. COMMON BRAND NAME(S): Adrucil What should I tell my health care provider before I take this medicine? They need to  know if you have any of these conditions:  blood disorders  dihydropyrimidine dehydrogenase (DPD) deficiency  infection (especially a virus infection such as chickenpox, cold sores, or herpes)  kidney disease  liver disease  malnourished, poor nutrition  recent or ongoing radiation therapy  an unusual or allergic reaction to fluorouracil, other chemotherapy, other medicines, foods, dyes, or preservatives  pregnant or trying to get pregnant  breast-feeding How should I use this medicine? This drug is given as an infusion or injection into a vein. It is administered in a hospital or clinic by a specially trained health care professional. Talk to your pediatrician regarding the use of this medicine in children. Special care may be needed. Overdosage: If you think you have taken too much of this medicine contact a poison control center or emergency room at once. NOTE: This medicine is only for you. Do not share this medicine with others. What if I miss a dose? It is important not to miss your dose. Call your doctor or health care professional if you are unable to keep an appointment. What may interact  with this medicine? Do not take this medicine with any of the following medications:  live virus vaccines This medicine may also interact with the following medications:  medicines that treat or prevent blood clots like warfarin, enoxaparin, and dalteparin This list may not describe all possible interactions. Give your health care provider a list of all the medicines, herbs, non-prescription drugs, or dietary supplements you use. Also tell them if you smoke, drink alcohol, or use illegal drugs. Some items may interact with your medicine. What should I watch for while using this medicine? Visit your doctor for checks on your progress. This drug may make you feel generally unwell. This is not uncommon, as chemotherapy can affect healthy cells as well as cancer cells. Report any side effects. Continue your course of treatment even though you feel ill unless your doctor tells you to stop. In some cases, you may be given additional medicines to help with side effects. Follow all directions for their use. Call your doctor or health care professional for advice if you get a fever, chills or sore throat, or other symptoms of a cold or flu. Do not treat yourself. This drug decreases your body's ability to fight infections. Try to avoid being around people who are sick. This medicine may increase your risk to bruise or bleed. Call your doctor or health care professional if you notice any unusual bleeding. Be careful brushing and flossing your teeth or using a toothpick because you may get an infection or bleed more easily. If you have any dental work done, tell your dentist you are receiving this medicine. Avoid taking products that contain aspirin, acetaminophen, ibuprofen, naproxen, or ketoprofen unless instructed by your doctor. These medicines may hide a fever. Do not become pregnant while taking this medicine. Women should inform their doctor if they wish to become pregnant or think they might be pregnant.  There is a potential for serious side effects to an unborn child. Talk to your health care professional or pharmacist for more information. Do not breast-feed an infant while taking this medicine. Men should inform their doctor if they wish to father a child. This medicine may lower sperm counts. Do not treat diarrhea with over the counter products. Contact your doctor if you have diarrhea that lasts more than 2 days or if it is severe and watery. This medicine can make you more sensitive to the sun.  Keep out of the sun. If you cannot avoid being in the sun, wear protective clothing and use sunscreen. Do not use sun lamps or tanning beds/booths. What side effects may I notice from receiving this medicine? Side effects that you should report to your doctor or health care professional as soon as possible:  allergic reactions like skin rash, itching or hives, swelling of the face, lips, or tongue  low blood counts - this medicine may decrease the number of white blood cells, red blood cells and platelets. You may be at increased risk for infections and bleeding.  signs of infection - fever or chills, cough, sore throat, pain or difficulty passing urine  signs of decreased platelets or bleeding - bruising, pinpoint red spots on the skin, black, tarry stools, blood in the urine  signs of decreased red blood cells - unusually weak or tired, fainting spells, lightheadedness  breathing problems  changes in vision  chest pain  mouth sores  nausea and vomiting  pain, swelling, redness at site where injected  pain, tingling, numbness in the hands or feet  redness, swelling, or sores on hands or feet  stomach pain  unusual bleeding Side effects that usually do not require medical attention (report to your doctor or health care professional if they continue or are bothersome):  changes in finger or toe nails  diarrhea  dry or itchy skin  hair loss  headache  loss of  appetite  sensitivity of eyes to the light  stomach upset  unusually teary eyes This list may not describe all possible side effects. Call your doctor for medical advice about side effects. You may report side effects to FDA at 1-800-FDA-1088. Where should I keep my medicine? This drug is given in a hospital or clinic and will not be stored at home. NOTE: This sheet is a summary. It may not cover all possible information. If you have questions about this medicine, talk to your doctor, pharmacist, or health care provider.  2021 [employer] Standard (2019-07-12 15:00:03) Leucovorin injection What is this medicine? LEUCOVORIN (loo koe VOR in) is used to prevent or treat the harmful effects of some medicines. This medicine is used to treat anemia caused by a low amount of folic acid in the body. It is also used with 5-fluorouracil (5-FU) to treat colon cancer. This medicine may be used for other purposes; ask your health care provider or pharmacist if you have questions. What should I tell my health care provider before I take this medicine? They need to know if you have any of these conditions:  anemia from low levels of vitamin B-12 in the blood  an unusual or allergic reaction to leucovorin, folic acid, other medicines, foods, dyes, or preservatives  pregnant or trying to get pregnant  breast-feeding How should I use this medicine? This medicine is for injection into a muscle or into a vein. It is given by a health care professional in a hospital or clinic setting. Talk to your pediatrician regarding the use of this medicine in children. Special care may be needed. Overdosage: If you think you have taken too much of this medicine contact a poison control center or emergency room at once. NOTE: This medicine is only for you. Do not share this medicine with others. What if I miss a dose? This does not apply. What may interact with this  medicine?  capecitabine  fluorouracil  phenobarbital  phenytoin  primidone  trimethoprim-sulfamethoxazole This list may not describe all possible interactions.  Give your health care provider a list of all the medicines, herbs, non-prescription drugs, or dietary supplements you use. Also tell them if you smoke, drink alcohol, or use illegal drugs. Some items may interact with your medicine. What should I watch for while using this medicine? Your condition will be monitored carefully while you are receiving this medicine. This medicine may increase the side effects of 5-fluorouracil, 5-FU. Tell your doctor or health care professional if you have diarrhea or mouth sores that do not get better or that get worse. What side effects may I notice from receiving this medicine? Side effects that you should report to your doctor or health care professional as soon as possible:  allergic reactions like skin rash, itching or hives, swelling of the face, lips, or tongue  breathing problems  fever, infection  mouth sores  unusual bleeding or bruising  unusually weak or tired Side effects that usually do not require medical attention (report to your doctor or health care professional if they continue or are bothersome):  constipation or diarrhea  loss of appetite  nausea, vomiting This list may not describe all possible side effects. Call your doctor for medical advice about side effects. You may report side effects to FDA at 1-800-FDA-1088. Where should I keep my medicine? This drug is given in a hospital or clinic and will not be stored at home. NOTE: This sheet is a summary. It may not cover all possible information. If you have questions about this medicine, talk to your doctor, pharmacist, or health care provider.  2021 Elsevier/Gold Standard (2008-02-15 16:50:29)

## 2020-12-06 NOTE — Progress Notes (Signed)
Tumor Board Documentation  Dan [name] was presented by Dr Rogue Bussing at our Tumor Board on 12/06/2020, which included representatives from medical oncology,radiation oncology,internal medicine,navigation,pathology,radiology,surgical,pharmacy,genetics,research,palliative care.  Dan [name] currently presents as a new patient,for [facility],for new positive pathology with history of the following treatments: surgical intervention(s).  Additionally, we reviewed previous medical and familial history, history of present illness, and recent lab results along with all available histopathologic and imaging studies. The tumor board considered available treatment options and made the following recommendations: Palliative chemotherapy (FOLFIRINOX) Second opinion at [facility] following procedures/referrals were also placed: No orders of the defined types were placed in this encounter.   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  Tumor board is a meeting of clinicians from various specialty areas who evaluate and discuss patients for whom a multidisciplinary approach is being considered. Final determinations in the plan of care are those of the provider(s). The responsibility for follow up of recommendations given during tumor board is that of the provider.   Today's extended care, comprehensive team conference, Dan [name] was not present for the discussion and was not examined.   Multidisciplinary Tumor Board is a multidisciplinary case peer review process.  Decisions discussed in the Multidisciplinary Tumor Board reflect the opinions of the specialists present at the conference without having examined the patient.  Ultimately, treatment and diagnostic decisions rest with the primary provider(s) and the patient.

## 2020-12-07 ENCOUNTER — Inpatient Hospital Stay: Payer: BLUE CROSS/BLUE SHIELD

## 2020-12-10 ENCOUNTER — Inpatient Hospital Stay (HOSPITAL_BASED_OUTPATIENT_CLINIC_OR_DEPARTMENT_OTHER): Payer: BLUE CROSS/BLUE SHIELD | Admitting: Internal Medicine

## 2020-12-10 ENCOUNTER — Inpatient Hospital Stay: Payer: BLUE CROSS/BLUE SHIELD

## 2020-12-10 ENCOUNTER — Other Ambulatory Visit: Payer: Self-pay

## 2020-12-10 ENCOUNTER — Telehealth: Payer: Self-pay

## 2020-12-10 VITALS — BP 124/81 | HR 85

## 2020-12-10 DIAGNOSIS — C787 Secondary malignant neoplasm of liver and intrahepatic bile duct: Secondary | ICD-10-CM | POA: Diagnosis not present

## 2020-12-10 DIAGNOSIS — C259 Malignant neoplasm of pancreas, unspecified: Secondary | ICD-10-CM

## 2020-12-10 DIAGNOSIS — Z5111 Encounter for antineoplastic chemotherapy: Secondary | ICD-10-CM | POA: Diagnosis not present

## 2020-12-10 LAB — CBC WITH DIFFERENTIAL/PLATELET
Abs Immature Granulocytes: 0.01 10*3/uL (ref 0.00–0.07)
Basophils Absolute: 0 10*3/uL (ref 0.0–0.1)
Basophils Relative: 0 %
Eosinophils Absolute: 0.2 10*3/uL (ref 0.0–0.5)
Eosinophils Relative: 3 %
HCT: 34.8 % — ABNORMAL LOW (ref 39.0–52.0)
Hemoglobin: 11.6 g/dL — ABNORMAL LOW (ref 13.0–17.0)
Immature Granulocytes: 0 %
Lymphocytes Relative: 27 %
Lymphs Abs: 1.6 10*3/uL (ref 0.7–4.0)
MCH: 27 pg (ref 26.0–34.0)
MCHC: 33.3 g/dL (ref 30.0–36.0)
MCV: 80.9 fL (ref 80.0–100.0)
Monocytes Absolute: 0.5 10*3/uL (ref 0.1–1.0)
Monocytes Relative: 8 %
Neutro Abs: 3.8 10*3/uL (ref 1.7–7.7)
Neutrophils Relative %: 62 %
Platelets: 235 10*3/uL (ref 150–400)
RBC: 4.3 MIL/uL (ref 4.22–5.81)
RDW: 13.2 % (ref 11.5–15.5)
WBC: 6.1 10*3/uL (ref 4.0–10.5)
nRBC: 0 % (ref 0.0–0.2)

## 2020-12-10 LAB — COMPREHENSIVE METABOLIC PANEL
ALT: 28 U/L (ref 0–44)
AST: 25 U/L (ref 15–41)
Albumin: 3.9 g/dL (ref 3.5–5.0)
Alkaline Phosphatase: 105 U/L (ref 38–126)
Anion gap: 9 (ref 5–15)
BUN: 16 mg/dL (ref 6–20)
CO2: 27 mmol/L (ref 22–32)
Calcium: 9.2 mg/dL (ref 8.9–10.3)
Chloride: 100 mmol/L (ref 98–111)
Creatinine, Ser: 1.02 mg/dL (ref 0.61–1.24)
GFR, Estimated: >60 mL/min (ref >60)
Glucose, Bld: 223 mg/dL — ABNORMAL HIGH (ref 70–99)
Potassium: 3.2 mmol/L — ABNORMAL LOW (ref 3.5–5.1)
Sodium: 136 mmol/L (ref 135–145)
Total Bilirubin: 0.8 mg/dL (ref 0.3–1.2)
Total Protein: 7.5 g/dL (ref 6.5–8.1)

## 2020-12-10 MED ORDER — OXYCODONE-ACETAMINOPHEN 7.5-325 MG PO TABS: 1.0000 | ORAL_TABLET | Freq: Three times a day (TID) | ORAL | 0 refills | Status: DC | PRN

## 2020-12-10 MED ORDER — DEXTROSE 5 % IV SOLN
Freq: Once | INTRAVENOUS | Status: AC
  Filled 2020-12-10: qty 250

## 2020-12-10 MED ORDER — OXALIPLATIN CHEMO INJECTION 100 MG/20ML
85.0000 mg/m2 | Freq: Once | INTRAVENOUS | Status: AC
  Administered 2020-12-10: 190 mg via INTRAVENOUS
  Filled 2020-12-10: qty 38

## 2020-12-10 MED ORDER — SODIUM CHLORIDE 0.9 % IV SOLN
2400.0000 mg/m2 | INTRAVENOUS | Status: DC
  Administered 2020-12-10: 5350 mg via INTRAVENOUS
  Filled 2020-12-10: qty 107

## 2020-12-10 MED ORDER — SODIUM CHLORIDE 0.9 % IV SOLN
150.0000 mg/m2 | Freq: Once | INTRAVENOUS | Status: AC
  Administered 2020-12-10: 340 mg via INTRAVENOUS
  Filled 2020-12-10: qty 15

## 2020-12-10 MED ORDER — PALONOSETRON HCL INJECTION 0.25 MG/5ML
0.2500 mg | Freq: Once | INTRAVENOUS | Status: AC
  Administered 2020-12-10: 0.25 mg via INTRAVENOUS
  Filled 2020-12-10: qty 5

## 2020-12-10 MED ORDER — LEUCOVORIN CALCIUM INJECTION 350 MG
405.0000 mg/m2 | Freq: Once | INTRAMUSCULAR | Status: AC
  Administered 2020-12-10: 900 mg via INTRAVENOUS
  Filled 2020-12-10: qty 45

## 2020-12-10 MED ORDER — SODIUM CHLORIDE 0.9 % IV SOLN
150.0000 mg | Freq: Once | INTRAVENOUS | Status: AC
  Administered 2020-12-10: 150 mg via INTRAVENOUS
  Filled 2020-12-10: qty 150

## 2020-12-10 MED ORDER — SODIUM CHLORIDE 0.9 % IV SOLN
10.0000 mg | Freq: Once | INTRAVENOUS | Status: AC
  Administered 2020-12-10: 10 mg via INTRAVENOUS
  Filled 2020-12-10: qty 10

## 2020-12-10 MED ORDER — ATROPINE SULFATE 1 MG/ML IJ SOLN
0.5000 mg | Freq: Once | INTRAMUSCULAR | Status: AC | PRN
  Administered 2020-12-10: 0.5 mg via INTRAVENOUS
  Filled 2020-12-10: qty 1

## 2020-12-10 MED ORDER — SODIUM CHLORIDE 0.9 % IV SOLN
INTRAVENOUS | Status: DC
  Filled 2020-12-10: qty 250

## 2020-12-10 NOTE — Assessment & Plan Note (Addendum)
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  Plan palliative chemotherapy with FOLFIRINOX. Will order [facility] one.   #Proceed with cycle #1 of FOLFIRINOX today. Labs today reviewed;  acceptable for treatment today.  Again reviewed the due to chemo drugs; also the potential side effects including but not limited to nausea vomiting diarrhea sores in the mouth; risk of infections etc.  Also discussed regarding tingling and numbness/cold sensitivity with oxaliplatin  # Growth factor- would be given as prophylaxis for chemotherapy-induced neutropenia to prevent febrile neutropenias. Discussed with LuAnn.   # Pain control: Secondary malignancy stable continue Percocet 7.5 currently taking twice daily; new refill given; encouraged to take MS contin too.   #Diabetes-continue current medication.  Need to monitor closely on chemotherapy- recommend BID.   # DISPOSITION:  # chemo today; pump off d-3 # ADD- d-3-undeyca # follow up in 2 weeks MD; labs- cbc/cmp;FOLFORINOX; day-3 pump off; udenyca- Dr.B

## 2020-12-10 NOTE — Progress Notes (Signed)
Wants to review side effects of chemo. Just concerned how he will feel later.

## 2020-12-10 NOTE — Telephone Encounter (Signed)
Foundation one submitted for liver biopsy.

## 2020-12-10 NOTE — Progress Notes (Signed)
At approximately 1022 patient started to c/o abdominal pain that radiated into chest. Emend was stopped immediately.  1023: BP 142/91, HR 88, O2 95% RA.  1024: 1L IVF started 1025: Dr. Rogue Bussing was contacted.  1027: Patient states pain is starting to improve. 1028: BP 124/81, HR 85, O2 96% RA. Patient states pain has improved and rate it 5/10.

## 2020-12-10 NOTE — Progress Notes (Signed)
[id number] - spoke with Dr. Rogue Bussing- advised to not restart emend. Observe patient for 15 mins. After 15 mins- pt states symptoms resolved. Per MD- ok to proceed with tx. Pt completed rest of tx without incident. Pt discharged stable.

## 2020-12-10 NOTE — Progress Notes (Signed)
[facility] CONSULT NOTE  Patient Care Team: [facility] as PCP - General Clent Jacks, RN as Oncology Nurse Navigator  CHIEF COMPLAINTS/PURPOSE OF CONSULTATION: Pancreatic cancer   Oncology History Overview Note  # April 2022- [name],  BIOPSY:  - METASTATIC PANCREATIC ACINAR CELL CARCINOMA.; STAGE IV; CT scan shows-approximately 8cm centimeter pancreatic mass; adrenal lesion; also approximately 6 cm liver lesion.   ; LIPASE NORMAL.   # April 18ht, 2022- FOLFIRINOX; Ellen Henri.   There is sufficient material for ancillary molecular studies if needed  (A1, A3, A4, A2)      # NGS/MOLECULAR TESTS:    # PALLIATIVE CARE EVALUATION:  # PAIN MANAGEMENT:    DIAGNOSIS:   STAGE:         ;  GOALS:  CURRENT/MOST RECENT THERAPY :     Pancreatic cancer metastasized to liver ([facility])  11/23/2020 Initial Diagnosis   Pancreatic cancer metastasized to liver ([place])   11/28/2020 Cancer Staging   Staging form: Exocrine Pancreas, AJCC 8th Edition - Clinical: Stage IV (cM1) - Signed by [name], MD on 11/28/2020 Histopathologic type: Acinar cell carcinoma   12/10/2020 -  Chemotherapy    Patient is on Treatment Plan: PANCREAS MODIFIED FOLFIRINOX Q14D X 4 CYCLES         HISTORY OF PRESENTING ILLNESS:  Dan [name] 55 y.o.  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  The interim patient underwent port placement.  Patient denies any nausea vomiting diarrhea.  Patient continues to take Percocet 7.5 mg up to 2-3 times a day.  Review of Systems  Constitutional: Negative for chills, diaphoresis, fever, malaise/fatigue and weight loss.  HENT: Negative for nosebleeds and sore throat.   Eyes: Negative for double vision.  Respiratory: Negative for cough, hemoptysis, sputum production, shortness of breath and wheezing.   Cardiovascular: Negative for chest pain, palpitations, orthopnea and leg  swelling.  Gastrointestinal: Positive for abdominal pain and nausea. Negative for blood in stool, constipation, diarrhea, heartburn, melena and vomiting.  Genitourinary: Negative for dysuria, frequency and urgency.  Musculoskeletal: Negative for back pain and joint pain.  Skin: Negative.  Negative for itching and rash.  Neurological: Negative for dizziness, tingling, focal weakness, weakness and headaches.  Endo/Heme/Allergies: Does not bruise/bleed easily.  Psychiatric/Behavioral: Negative for depression. The patient is not nervous/anxious and does not have insomnia.      MEDICAL HISTORY:  Past Medical History:  Diagnosis Date  . Acid reflux   . Asthma   . Diabetes mellitus without complication ([place])   . History of kidney stones   . Hypertension   . Kidney stones     SURGICAL HISTORY: Past Surgical History:  Procedure Laterality Date  . COLONOSCOPY WITH PROPOFOL N/A 12/02/2019   Procedure: COLONOSCOPY WITH PROPOFOL;  Surgeon: [name], MD;  Location: [facility] ENDOSCOPY;  Service: Gastroenterology;  Laterality: N/A;  . FRACTURE SURGERY Left at age 76   pelvic fracture  . HEMORRHOID SURGERY N/A 01/31/2020   Procedure: HEMORRHOIDECTOMY;  Surgeon: Jules Husbands, MD;  Location: [facility] ORS;  Service: General;  Laterality: N/A;  . IR IMAGING GUIDED PORT INSERTION  11/30/2020  . pelvis fractur    . ROTATOR CUFF REPAIR      SOCIAL HISTORY: Social History   Socioeconomic History  . Marital status: Married    Spouse name: Not on file  . Number of children: Not on file  . Years of education: Not on file  .  Highest education level: Not on file  Occupational History  . Not on file  Tobacco Use  . Smoking status: Former Smoker    Packs/day: 0.50    Years: 25.00    Pack years: 12.50    Types: Cigarettes  . Smokeless tobacco: Never Used  Vaping Use  . Vaping Use: Never used  Substance and Sexual Activity  . Alcohol use: Yes    Alcohol/week: 6.0 standard drinks    Types: 6 Cans of  beer per week    Comment: weekends only  . Drug use: No  . Sexual activity: Yes  Other Topics Concern  . Not on file  Social History Narrative   Works at [employer]. In [place]. Quit smoking 2021 June. Social. Alcohol.    Social Determinants of Health   Financial Resource Strain: Not on file  Food Insecurity: Not on file  Transportation Needs: Not on file  Physical Activity: Not on file  Stress: Not on file  Social Connections: Not on file  Intimate Partner Violence: Not on file    FAMILY HISTORY: Family History  Problem Relation Age of Onset  . Cancer Sister        2 sisters 54 and 7- both died of breast cancer  . Diabetes Brother   . Hypertension Brother   . Heart attack Father   . Asthma Son     ALLERGIES:  is allergic to cherry, ibuprofen, other, and tramadol.  MEDICATIONS:  Current Outpatient Medications  Medication Sig Dispense Refill  . amLODipine (NORVASC) 10 MG tablet TAKE 1 TABLET(10 MG) BY MOUTH DAILY (Patient taking differently: Take 10 mg by mouth daily.) 90 tablet 1  . chlorthalidone (HYGROTON) 25 MG tablet Take 25 mg by mouth every morning.    Marland Kitchen JARDIANCE 10 MG TABS tablet Take 10 mg by mouth daily.    Marland Kitchen lidocaine-prilocaine (EMLA) cream Apply 1 application topically as needed (prior to port a cath needle access on the days of chemotherapy). 30 g 3  . losartan (COZAAR) 100 MG tablet TAKE 1 TABLET(100 MG) BY MOUTH DAILY (Patient taking differently: Take 100 mg by mouth daily.) 90 tablet 1  . morphine (MS CONTIN) 30 MG 12 hr tablet Take 1 tablet (30 mg total) by mouth every 12 (twelve) hours. 30 tablet 0  . omeprazole (PRILOSEC) 40 MG capsule Take 1 capsule (40 mg total) by mouth daily. 30 capsule 3  . ondansetron (ZOFRAN) 8 MG tablet Take 1 tablet (8 mg total) by mouth every 8 (eight) hours as needed for nausea, vomiting or refractory nausea / vomiting. 20 tablet 3  . prochlorperazine (COMPAZINE) 10 MG tablet Take 1 tablet (10 mg total) by mouth every 6 (six)  hours as needed for nausea or vomiting. 30 tablet 3  . oxyCODONE-acetaminophen (PERCOCET) 7.5-325 MG tablet Take 1 tablet by mouth every 8 (eight) hours as needed for severe pain. 60 tablet 0   No current facility-administered medications for this visit.   Facility-Administered Medications Ordered in Other Visits  Medication Dose Route Frequency Provider Last Rate Last Admin  . 0.9 %  sodium chloride infusion   Intravenous Continuous [name], MD   Stopped at 12/10/20 1054  . fluorouracil (ADRUCIL) 5,350 mg in sodium chloride 0.9 % 143 mL chemo infusion  2,400 mg/m2 (Treatment Plan Recorded) Intravenous 1 day or 1 dose Charlaine Dalton R, MD      . irinotecan (CAMPTOSAR) 340 mg in sodium chloride 0.9 % 500 mL chemo infusion  150 mg/m2 (Treatment  Plan Recorded) Intravenous Once Charlaine Dalton R, MD      . leucovorin 900 mg in sodium chloride 0.9 % 250 mL infusion  405 mg/m2 (Treatment Plan Recorded) Intravenous Once [name], MD      . oxaliplatin (ELOXATIN) 190 mg in dextrose 5 % 500 mL chemo infusion  85 mg/m2 (Treatment Plan Recorded) Intravenous Once [name], MD 269 mL/hr at 12/10/20 1112 190 mg at 12/10/20 1112      .  PHYSICAL EXAMINATION: ECOG PERFORMANCE STATUS: 1 - Symptomatic but completely ambulatory  Vitals:   12/10/20 0842  BP: 114/68  Pulse: 98  Resp: 16  Temp: 98.6 F (37 C)  SpO2: 99%   Filed Weights   12/10/20 0842  Weight: 213 lb (96.6 kg)    Physical Exam Constitutional:      Comments: Accompanied by wife.  Ambulating independently.  HENT:     Head: Normocephalic and atraumatic.     Mouth/Throat:     Pharynx: No oropharyngeal exudate.  Eyes:     Pupils: Pupils are equal, round, and reactive to light.  Cardiovascular:     Rate and Rhythm: Normal rate and regular rhythm.  Pulmonary:     Effort: Pulmonary effort is normal. No respiratory distress.     Breath sounds: Normal breath sounds. No wheezing.   Abdominal:     General: Bowel sounds are normal. There is no distension.     Palpations: Abdomen is soft. There is no mass.     Tenderness: There is no abdominal tenderness. There is no guarding or rebound.  Musculoskeletal:        General: No tenderness. Normal range of motion.     Cervical back: Normal range of motion and neck supple.  Skin:    General: Skin is warm.  Neurological:     Mental Status: He is alert and oriented to person, place, and time.  Psychiatric:        Mood and Affect: Affect normal.      LABORATORY DATA:  I have reviewed the data as listed Lab Results  Component Value Date   WBC 6.1 12/10/2020   HGB 11.6 (L) 12/10/2020   HCT 34.8 (L) 12/10/2020   MCV 80.9 12/10/2020   PLT 235 12/10/2020   Recent Labs    01/27/20 1012 05/08/20 1955 05/09/20 0921 11/19/20 0745 11/20/20 0514 12/10/20 0832  NA 140 132* 135 136 138 136  K 3.9 4.0 4.0 3.1* 3.7 3.2*  CL 105 93* 95* 100 103 100  CO2 27 28 29 24 28 27   GLUCOSE 106* 592* 449* 172* 148* 223*  BUN 13 26* 22* 18 15 16   CREATININE 0.90 1.14 1.07 1.10 1.09 1.02  CALCIUM 9.1 9.8 9.6 9.2 9.2 9.2  GFRNONAA >60 >60 >60 >60 >60 >60  GFRAA >60 >60 >60  --   --   --   PROT  --   --   --  8.1  --  7.5  ALBUMIN  --   --   --  4.0  --  3.9  AST  --   --   --  51*  --  25  ALT  --   --   --  44  --  28  ALKPHOS  --   --   --  89  --  105  BILITOT  --   --   --  0.9  --  0.8    RADIOGRAPHIC STUDIES: I have personally reviewed  the radiological images as listed and agreed with the findings in the report. CT Abdomen Pelvis W Contrast  Result Date: 11/19/2020 CLINICAL DATA:  Mid to lower abdominal pain for several days, worse with inspiration. EXAM: CT ABDOMEN AND PELVIS WITH CONTRAST TECHNIQUE: Multidetector CT imaging of the abdomen and pelvis was performed using the standard protocol following bolus administration of intravenous contrast. CONTRAST:  153mL OMNIPAQUE IOHEXOL 300 MG/ML  SOLN COMPARISON:  05/18/2013  CT abdomen/pelvis. FINDINGS: Lower chest: No significant pulmonary nodules or acute consolidative airspace disease. Hepatobiliary: Hypodense 6.3 x 4.9 cm peripheral segment 7 right liver mass (series 2/image 19), new. No additional liver masses. Normal gallbladder with no radiopaque cholelithiasis. No biliary ductal dilatation. Pancreas: Heterogeneous hypodense 8.2 x 8.1 cm pancreatic tail mass (series 2/image 24), new. No pancreatic duct dilation. Spleen: Normal size. No mass. Adrenals/Urinary Tract: Left adrenal 1.9 cm nodule with density 55 HU, new. No right adrenal nodules. Normal kidneys with no hydronephrosis and no renal mass. Normal bladder. Stomach/Bowel: Normal non-distended stomach. Normal caliber small bowel with no small bowel wall thickening. Normal appendix. Scattered mild to moderate colonic diverticulosis with no large bowel wall thickening or significant pericolonic fat stranding. Vascular/Lymphatic: Atherosclerotic nonaneurysmal abdominal aorta. Patent portal, splenic, hepatic and renal veins. Enlarged 2.4 cm short axis diameter peripancreatic lymph node adjacent to the pancreatic tail (series 2/image 25). No additional pathologically enlarged lymph nodes in the abdomen or pelvis. Reproductive: Normal size prostate. Other: No pneumoperitoneum, ascites or focal fluid collection. Musculoskeletal: Dense patchy sclerosis in the L5 vertebral body, increased from prior. IMPRESSION: 1. Heterogeneous hypodense 8.2 x 8.1 cm pancreatic tail mass, new, suspicious for primary pancreatic malignancy. MRI abdomen without and with IV contrast recommended for further evaluation. 2. Adjacent peripancreatic lymphadenopathy, suspicious for local nodal metastasis. 3. New hypodense 6.3 cm peripheral segment 7 right liver mass, suspicious for liver metastasis. 4. New left adrenal nodule, suspicious for adrenal metastasis. 5. Dense patchy sclerosis in the L5 vertebral body, increased from prior, indeterminate for bone  metastasis versus Paget's disease. 6. Aortic Atherosclerosis (ICD10-I70.0). Electronically Signed   By: Ilona Sorrel M.D.   On: 11/19/2020 08:58   NM PET Image Initial (PI) Skull Base To Thigh  Result Date: 12/07/2020 CLINICAL DATA:  Initial treatment strategy for metastatic pancreatic cancer. Liver metastasis documented on percutaneous biopsy 11/20/2020. EXAM: NUCLEAR MEDICINE PET SKULL BASE TO THIGH TECHNIQUE: 11.76 mCi F-18 FDG was injected intravenously. Full-ring PET imaging was performed from the skull base to thigh after the radiotracer. CT data was obtained and used for attenuation correction and anatomic localization. Fasting blood glucose: 208 mg/dl COMPARISON:  Abdominopelvic CT 11/19/2020.  Chest CT 06/07/2013 FINDINGS: Mediastinal blood pool activity: SUV max 2.4 Liver activity: 3.7 NECK: No hypermetabolic cervical lymph nodes are identified.There are no lesions of the pharyngeal mucosal space. Incidental CT findings: none CHEST: There are no hypermetabolic mediastinal, hilar or axillary lymph nodes. No hypermetabolic pulmonary activity or suspicious pulmonary nodularity. Incidental CT findings: Right IJ central venous catheter projects to the superior cavoatrial junction. There is atherosclerosis of the aorta and coronary arteries. Mild emphysematous changes are present. ABDOMEN/PELVIS: There is no hypermetabolic activity within the liver. The large irregular peripheral lesion in the right hepatic lobe (segment 7) measures 7.3 x 5.5 cm on image 130/3 (previously 6.3 x 4.9 cm). If anything, this demonstrates slightly decreased metabolic activity (SUV max 3.0) relative to background liver. Likewise, the large heterogeneous mass involving pancreatic tail demonstrates only mild hypermetabolic activity with an SUV max of  3.7. This measures approximately 8.3 x 7.5 cm on image 132/3, similar to prior CT. The adjacent lymph nodes and mildly enlarged left adrenal gland show no significant hypermetabolic  activity. Incidental CT findings: Aortic and branch vessel atherosclerosis. No ascites or peritoneal nodularity. SKELETON: There is mild hypermetabolic activity within the L5 vertebral body, corresponding with diffuse sclerosis on CT. This sclerosis was present on the remote 2014 CT, but has progressed. This has an SUV max of 3.8. No other hypermetabolic osseous lesions are identified. Incidental CT findings: none IMPRESSION: 1. The known pancreatic tail mass, adjacent adenopathy and dominant lesion in the right hepatic lobe do not show significant hypermetabolic activity in this patient with known metastatic pancreatic cancer. This limits the sensitivity of this study for detecting metastatic disease. There is limited assessment of the previously demonstrated small left adrenal nodule. 2. Low-level metabolic activity throughout the L5 vertebral body which demonstrates increased sclerosis compared with remote CT nearly 8 years ago. Typical features of Paget's disease are not seen on recent CT, although the relative stability of this finding argues against metastatic disease. Electronically Signed   By: Richardean Sale M.D.   On: 12/07/2020 15:54   US BIOPSY (LIVER)  Result Date: 11/20/2020 INDICATION: 8 cm mass of the pancreatic tail and associated 6 cm mass within the liver. The patient presents for liver lesion biopsy. EXAM: ULTRASOUND GUIDED CORE BIOPSY OF LIVER MEDICATIONS: None. ANESTHESIA/SEDATION: Fentanyl 50 mcg IV; Versed 1.0 mg IV Moderate Sedation Time:  24 minutes. The patient was continuously monitored during the procedure by the interventional radiology nurse under my direct supervision. PROCEDURE: The procedure, risks, benefits, and alternatives were explained to the patient. Questions regarding the procedure were encouraged and answered. The patient understands and consents to the procedure. A time-out was performed prior to initiating the procedure. Ultrasound was performed to localize a lesion  within the right lobe of the liver. The right lateral abdominal wall was prepped with chlorhexidine in a sterile fashion, and a sterile drape was applied covering the operative field. A sterile gown and sterile gloves were used for the procedure. Local anesthesia was provided with 1% Lidocaine. Under direct ultrasound guidance, a 17 gauge trocar needle was advanced to the level of the lesion. Three passes were obtained through the lesion with an 18 gauge core biopsy device. Tissue was submitted in formalin. Gel-Foam pledgets were advanced through the outer needle as the needle was removed and retracted. COMPLICATIONS: None immediate. FINDINGS: Solid hypoechoic mass within the lateral aspect of the right lobe of the liver measures approximately 6.9 x 4.5 x 5.5 cm. Solid tissue was obtained. IMPRESSION: Ultrasound-guided core biopsy performed of a mass within the lateral right lobe of the liver measuring 6.9 cm in greatest diameter by ultrasound. Electronically Signed   By: Aletta Edouard M.D.   On: 11/20/2020 12:37   IR IMAGING GUIDED PORT INSERTION  Result Date: 11/30/2020 CLINICAL DATA:  Metastatic pancreatic carcinoma and need for porta cath to begin chemotherapy. EXAM: IMPLANTED PORT A CATH PLACEMENT WITH ULTRASOUND AND FLUOROSCOPIC GUIDANCE ANESTHESIA/SEDATION: 2.0 mg IV Versed; 75 mcg IV Fentanyl Total Moderate Sedation Time:  28 minutes The patient's level of consciousness and physiologic status were continuously monitored during the procedure by Radiology nursing. FLUOROSCOPY TIME:  18 seconds.  2.7 mGy. PROCEDURE: The procedure, risks, benefits, and alternatives were explained to the patient. Questions regarding the procedure were encouraged and answered. The patient understands and consents to the procedure. A time-out was performed prior to initiating the  procedure. Ultrasound was utilized to confirm patency of the right internal jugular vein. The right neck and chest were prepped with chlorhexidine in  a sterile fashion, and a sterile drape was applied covering the operative field. Maximum barrier sterile technique with sterile gowns and gloves were used for the procedure. Local anesthesia was provided with 1% lidocaine. After creating a small venotomy incision, a 21 gauge needle was advanced into the right internal jugular vein under direct, real-time ultrasound guidance. Ultrasound image documentation was performed. After securing guidewire access, an 8 Fr dilator was placed. A J-wire was kinked to measure appropriate catheter length. A subcutaneous port pocket was then created along the upper chest wall utilizing sharp and blunt dissection. Portable cautery was utilized. The pocket was irrigated with sterile saline. A single lumen power injectable port was chosen for placement. The 8 Fr catheter was tunneled from the port pocket site to the venotomy incision. The port was placed in the pocket. External catheter was trimmed to appropriate length based on guidewire measurement. At the venotomy, an 8 Fr peel-away sheath was placed over a guidewire. The catheter was then placed through the sheath and the sheath removed. Final catheter positioning was confirmed and documented with a fluoroscopic spot image. The port was accessed with a needle and aspirated and flushed with heparinized saline. The access needle was removed. The venotomy and port pocket incisions were closed with subcutaneous 3-0 Monocryl and subcuticular 4-0 Vicryl. Dermabond was applied to both incisions. COMPLICATIONS: COMPLICATIONS None FINDINGS: After catheter placement, the tip lies at the cavo-atrial junction. The catheter aspirates normally and is ready for immediate use. IMPRESSION: Placement of single lumen port a cath via right internal jugular vein. The catheter tip lies at the cavo-atrial junction. A power injectable port a cath was placed and is ready for immediate use. Electronically Signed   By: Aletta Edouard M.D.   On: 11/30/2020  13:51    ASSESSMENT & PLAN:   Pancreatic cancer metastasized to liver [facility]) #Pancreatic acinar cell cancer with metastasis to liver. STAGE IV.  Plan palliative chemotherapy with FOLFIRINOX. Will order [facility].   #Proceed with cycle #1 of FOLFIRINOX today. Labs today reviewed;  acceptable for treatment today.  Again reviewed the due to chemo drugs; also the potential side effects including but not limited to nausea vomiting diarrhea sores in the mouth; risk of infections etc.  Also discussed regarding tingling and numbness/cold sensitivity with oxaliplatin  # Growth factor- would be given as prophylaxis for chemotherapy-induced neutropenia to prevent febrile neutropenias. Discussed with LuAnn.   # Pain control: Secondary malignancy stable continue Percocet 7.5 currently taking twice daily; new refill given; encouraged to take MS contin too.   #Diabetes-continue current medication.  Need to monitor closely on chemotherapy- recommend BID.   # DISPOSITION:  # chemo today; pump off d-3 # ADD- d-3-undeyca # follow up in 2 weeks MD; labs- cbc/cmp;FOLFORINOX; day-3 pump off; udenyca- Dr.B  All questions were answered. The patient knows to call the clinic with any problems, questions or concerns.    [name], MD 12/10/2020 11:43 AM

## 2020-12-12 ENCOUNTER — Inpatient Hospital Stay: Payer: BLUE CROSS/BLUE SHIELD

## 2020-12-12 ENCOUNTER — Telehealth: Payer: Self-pay

## 2020-12-12 DIAGNOSIS — C259 Malignant neoplasm of pancreas, unspecified: Secondary | ICD-10-CM

## 2020-12-12 DIAGNOSIS — Z5111 Encounter for antineoplastic chemotherapy: Secondary | ICD-10-CM | POA: Diagnosis not present

## 2020-12-12 DIAGNOSIS — C787 Secondary malignant neoplasm of liver and intrahepatic bile duct: Secondary | ICD-10-CM

## 2020-12-12 MED ORDER — HEPARIN SOD (PORK) LOCK FLUSH 100 UNIT/ML IV SOLN
INTRAVENOUS | Status: AC
  Filled 2020-12-12: qty 5

## 2020-12-12 MED ORDER — HEPARIN SOD (PORK) LOCK FLUSH 100 UNIT/ML IV SOLN
500.0000 [IU] | Freq: Once | INTRAVENOUS | Status: AC | PRN
  Administered 2020-12-12: 500 [IU]
  Filled 2020-12-12: qty 5

## 2020-12-12 MED ORDER — SODIUM CHLORIDE 0.9% FLUSH
10.0000 mL | INTRAVENOUS | Status: DC | PRN
  Administered 2020-12-12: 10 mL
  Filled 2020-12-12: qty 10

## 2020-12-12 MED ORDER — PEGFILGRASTIM-CBQV 6 MG/0.6ML ~~LOC~~ SOSY
6.0000 mg | PREFILLED_SYRINGE | Freq: Once | SUBCUTANEOUS | Status: AC
  Administered 2020-12-12: 6 mg via SUBCUTANEOUS
  Filled 2020-12-12: qty 0.6

## 2020-12-12 NOTE — Telephone Encounter (Signed)
Telephone call to patient for follow up after receiving first infusion.   Patient states infusion went great.  States eating good and drinking plenty of fluids.   Denies any nausea or vomiting.  Encouraged patient to call for any concerns or questions. 

## 2020-12-19 ENCOUNTER — Encounter: Payer: Self-pay | Admitting: Internal Medicine

## 2020-12-21 ENCOUNTER — Encounter: Payer: Self-pay | Admitting: Internal Medicine

## 2020-12-21 ENCOUNTER — Telehealth: Payer: Self-pay | Admitting: *Deleted

## 2020-12-21 NOTE — Telephone Encounter (Signed)
Contacted [employer] [phone] to attempt prior auth for [facility] testing. Information provided to representative.  Claim will take 3 business days to result. If denied or need to expedite this process Dr. B could call [phone] back to speak to a md representative for peer to peer. At this time, Dr. B would like the PA process to go normal business days. No need to expedite.  [facility] location # [id number] [facility] address given to [place]: [place] [address]  [place] for [place] [phone]

## 2020-12-24 ENCOUNTER — Inpatient Hospital Stay: Payer: BLUE CROSS/BLUE SHIELD | Attending: Internal Medicine

## 2020-12-24 ENCOUNTER — Inpatient Hospital Stay: Payer: BLUE CROSS/BLUE SHIELD

## 2020-12-24 ENCOUNTER — Encounter: Payer: Self-pay | Admitting: Internal Medicine

## 2020-12-24 ENCOUNTER — Inpatient Hospital Stay (HOSPITAL_BASED_OUTPATIENT_CLINIC_OR_DEPARTMENT_OTHER): Payer: BLUE CROSS/BLUE SHIELD | Admitting: Internal Medicine

## 2020-12-24 DIAGNOSIS — E1165 Type 2 diabetes mellitus with hyperglycemia: Secondary | ICD-10-CM | POA: Diagnosis not present

## 2020-12-24 DIAGNOSIS — R197 Diarrhea, unspecified: Secondary | ICD-10-CM | POA: Insufficient documentation

## 2020-12-24 DIAGNOSIS — Z5189 Encounter for other specified aftercare: Secondary | ICD-10-CM | POA: Insufficient documentation

## 2020-12-24 DIAGNOSIS — C252 Malignant neoplasm of tail of pancreas: Secondary | ICD-10-CM | POA: Diagnosis present

## 2020-12-24 DIAGNOSIS — C259 Malignant neoplasm of pancreas, unspecified: Secondary | ICD-10-CM

## 2020-12-24 DIAGNOSIS — Z5111 Encounter for antineoplastic chemotherapy: Secondary | ICD-10-CM | POA: Diagnosis not present

## 2020-12-24 DIAGNOSIS — C787 Secondary malignant neoplasm of liver and intrahepatic bile duct: Secondary | ICD-10-CM | POA: Diagnosis not present

## 2020-12-24 DIAGNOSIS — G893 Neoplasm related pain (acute) (chronic): Secondary | ICD-10-CM | POA: Insufficient documentation

## 2020-12-24 DIAGNOSIS — E876 Hypokalemia: Secondary | ICD-10-CM | POA: Insufficient documentation

## 2020-12-24 DIAGNOSIS — Z452 Encounter for adjustment and management of vascular access device: Secondary | ICD-10-CM | POA: Insufficient documentation

## 2020-12-24 LAB — CBC WITH DIFFERENTIAL/PLATELET
Abs Immature Granulocytes: 0.49 10*3/uL — ABNORMAL HIGH (ref 0.00–0.07)
Basophils Absolute: 0.1 10*3/uL (ref 0.0–0.1)
Basophils Relative: 1 %
Eosinophils Absolute: 0.5 10*3/uL (ref 0.0–0.5)
Eosinophils Relative: 6 %
HCT: 35.8 % — ABNORMAL LOW (ref 39.0–52.0)
Hemoglobin: 11.6 g/dL — ABNORMAL LOW (ref 13.0–17.0)
Immature Granulocytes: 5 %
Lymphocytes Relative: 20 %
Lymphs Abs: 1.9 10*3/uL (ref 0.7–4.0)
MCH: 26.9 pg (ref 26.0–34.0)
MCHC: 32.4 g/dL (ref 30.0–36.0)
MCV: 82.9 fL (ref 80.0–100.0)
Monocytes Absolute: 0.6 10*3/uL (ref 0.1–1.0)
Monocytes Relative: 7 %
Neutro Abs: 5.6 10*3/uL (ref 1.7–7.7)
Neutrophils Relative %: 61 %
Platelets: 147 10*3/uL — ABNORMAL LOW (ref 150–400)
RBC: 4.32 MIL/uL (ref 4.22–5.81)
RDW: 13.9 % (ref 11.5–15.5)
WBC: 9.3 10*3/uL (ref 4.0–10.5)
nRBC: 0 % (ref 0.0–0.2)

## 2020-12-24 LAB — COMPREHENSIVE METABOLIC PANEL
ALT: 27 U/L (ref 0–44)
AST: 29 U/L (ref 15–41)
Albumin: 4 g/dL (ref 3.5–5.0)
Alkaline Phosphatase: 123 U/L (ref 38–126)
Anion gap: 14 (ref 5–15)
BUN: 13 mg/dL (ref 6–20)
CO2: 25 mmol/L (ref 22–32)
Calcium: 9.2 mg/dL (ref 8.9–10.3)
Chloride: 99 mmol/L (ref 98–111)
Creatinine, Ser: 1.07 mg/dL (ref 0.61–1.24)
GFR, Estimated: >60 mL/min (ref >60)
Glucose, Bld: 276 mg/dL — ABNORMAL HIGH (ref 70–99)
Potassium: 3.2 mmol/L — ABNORMAL LOW (ref 3.5–5.1)
Sodium: 138 mmol/L (ref 135–145)
Total Bilirubin: 0.4 mg/dL (ref 0.3–1.2)
Total Protein: 7.3 g/dL (ref 6.5–8.1)

## 2020-12-24 MED ORDER — HEPARIN SOD (PORK) LOCK FLUSH 100 UNIT/ML IV SOLN
500.0000 [IU] | Freq: Once | INTRAVENOUS | Status: DC | PRN
  Filled 2020-12-24: qty 5

## 2020-12-24 MED ORDER — SODIUM CHLORIDE 0.9 % IV SOLN
150.0000 mg/m2 | Freq: Once | INTRAVENOUS | Status: AC
  Administered 2020-12-24: 340 mg via INTRAVENOUS
  Filled 2020-12-24: qty 15

## 2020-12-24 MED ORDER — OXYCODONE-ACETAMINOPHEN 7.5-325 MG PO TABS: 1.0000 | ORAL_TABLET | Freq: Three times a day (TID) | ORAL | 0 refills | Status: DC | PRN

## 2020-12-24 MED ORDER — OXALIPLATIN CHEMO INJECTION 100 MG/20ML
85.0000 mg/m2 | Freq: Once | INTRAVENOUS | Status: AC
  Administered 2020-12-24: 190 mg via INTRAVENOUS
  Filled 2020-12-24: qty 38

## 2020-12-24 MED ORDER — SODIUM CHLORIDE 0.9 % IV SOLN
405.0000 mg/m2 | Freq: Once | INTRAVENOUS | Status: AC
  Administered 2020-12-24: 900 mg via INTRAVENOUS
  Filled 2020-12-24: qty 45

## 2020-12-24 MED ORDER — PALONOSETRON HCL INJECTION 0.25 MG/5ML
0.2500 mg | Freq: Once | INTRAVENOUS | Status: AC
  Administered 2020-12-24: 0.25 mg via INTRAVENOUS
  Filled 2020-12-24: qty 5

## 2020-12-24 MED ORDER — SODIUM CHLORIDE 0.9% FLUSH
10.0000 mL | INTRAVENOUS | Status: DC | PRN
  Administered 2020-12-24: 10 mL via INTRAVENOUS
  Filled 2020-12-24: qty 10

## 2020-12-24 MED ORDER — MORPHINE SULFATE ER 30 MG PO TBCR: 30.0000 mg | EXTENDED_RELEASE_TABLET | Freq: Two times a day (BID) | ORAL | 0 refills | Status: DC

## 2020-12-24 MED ORDER — DEXTROSE 5 % IV SOLN
Freq: Once | INTRAVENOUS | Status: AC
  Filled 2020-12-24: qty 250

## 2020-12-24 MED ORDER — SODIUM CHLORIDE 0.9 % IV SOLN
2400.0000 mg/m2 | INTRAVENOUS | Status: DC
  Administered 2020-12-24: 5350 mg via INTRAVENOUS
  Filled 2020-12-24: qty 107

## 2020-12-24 MED ORDER — ATROPINE SULFATE 1 MG/ML IJ SOLN
0.5000 mg | Freq: Once | INTRAMUSCULAR | Status: AC | PRN
  Administered 2020-12-24: 0.5 mg via INTRAVENOUS
  Filled 2020-12-24: qty 1

## 2020-12-24 MED ORDER — SODIUM CHLORIDE 0.9 % IV SOLN
10.0000 mg | Freq: Once | INTRAVENOUS | Status: AC
  Administered 2020-12-24: 10 mg via INTRAVENOUS
  Filled 2020-12-24: qty 10

## 2020-12-24 MED ORDER — POTASSIUM CHLORIDE CRYS ER 20 MEQ PO TBCR: EXTENDED_RELEASE_TABLET | ORAL | 3 refills | Status: DC

## 2020-12-24 NOTE — Patient Instructions (Signed)
[facility]  Discharge Instructions: Thank you for choosing [facility] to provide your oncology and hematology care.  If you have a lab appointment with the [facility], please go directly to the [facility] and check in at the registration area.  Wear comfortable clothing and clothing appropriate for easy access to any Portacath or PICC line.   We strive to give you quality time with your provider. You may need to reschedule your appointment if you arrive late (15 or more minutes).  Arriving late affects you and other patients whose appointments are after yours.  Also, if you miss three or more appointments without notifying the office, you may be dismissed from the clinic at the provider's discretion.      For prescription refill requests, have your pharmacy contact our office and allow 72 hours for refills to be completed.    Today you received the following chemotherapy and/or immunotherapy agents: Oxaliplatin, Irinotecan, 5FU  Irinotecan injection What is this medicine? IRINOTECAN (ir in oh TEE kan ) is a chemotherapy drug. It is used to treat colon and rectal cancer. This medicine may be used for other purposes; ask your health care provider or pharmacist if you have questions. COMMON BRAND NAME(S): Camptosar What should I tell my health care provider before I take this medicine? They need to know if you have any of these conditions:  dehydration  diarrhea  infection (especially a virus infection such as chickenpox, cold sores, or herpes)  liver disease  low blood counts, like low white cell, platelet, or red cell counts  low levels of calcium, magnesium, or potassium in the blood  recent or ongoing radiation therapy  an unusual or allergic reaction to irinotecan, other medicines, foods, dyes, or preservatives  pregnant or trying to get pregnant  breast-feeding How should I use this medicine? This drug is given as  an infusion into a vein. It is administered in a hospital or clinic by a specially trained health care professional. Talk to your pediatrician regarding the use of this medicine in children. Special care may be needed. Overdosage: If you think you have taken too much of this medicine contact a poison control center or emergency room at once. NOTE: This medicine is only for you. Do not share this medicine with others. What if I miss a dose? It is important not to miss your dose. Call your doctor or health care professional if you are unable to keep an appointment. What may interact with this medicine? Do not take this medicine with any of the following medications:  cobicistat  itraconazole This medicine may interact with the following medications:  antiviral medicines for HIV or AIDS  certain antibiotics like rifampin or rifabutin  certain medicines for fungal infections like ketoconazole, posaconazole, and voriconazole  certain medicines for seizures like carbamazepine, phenobarbital, phenotoin  clarithromycin  gemfibrozil  nefazodone  St. John's Wort This list may not describe all possible interactions. Give your health care provider a list of all the medicines, herbs, non-prescription drugs, or dietary supplements you use. Also tell them if you smoke, drink alcohol, or use illegal drugs. Some items may interact with your medicine. What should I watch for while using this medicine? Your condition will be monitored carefully while you are receiving this medicine. You will need important blood work done while you are taking this medicine. This drug may make you feel generally unwell. This is not uncommon, as chemotherapy can affect healthy cells  as well as cancer cells. Report any side effects. Continue your course of treatment even though you feel ill unless your doctor tells you to stop. In some cases, you may be given additional medicines to help with side effects. Follow all  directions for their use. You may get drowsy or dizzy. Do not drive, use machinery, or do anything that needs mental alertness until you know how this medicine affects you. Do not stand or sit up quickly, especially if you are an older patient. This reduces the risk of dizzy or fainting spells. Call your health care professional for advice if you get a fever, chills, or sore throat, or other symptoms of a cold or flu. Do not treat yourself. This medicine decreases your body's ability to fight infections. Try to avoid being around people who are sick. Avoid taking products that contain aspirin, acetaminophen, ibuprofen, naproxen, or ketoprofen unless instructed by your doctor. These medicines may hide a fever. This medicine may increase your risk to bruise or bleed. Call your doctor or health care professional if you notice any unusual bleeding. Be careful brushing and flossing your teeth or using a toothpick because you may get an infection or bleed more easily. If you have any dental work done, tell your dentist you are receiving this medicine. Do not become pregnant while taking this medicine or for 6 months after stopping it. Women should inform their health care professional if they wish to become pregnant or think they might be pregnant. Men should not father a child while taking this medicine and for 3 months after stopping it. There is potential for serious side effects to an unborn child. Talk to your health care professional for more information. Do not breast-feed an infant while taking this medicine or for 7 days after stopping it. This medicine has caused ovarian failure in some women. This medicine may make it more difficult to get pregnant. Talk to your health care professional if you are concerned about your fertility. This medicine has caused decreased sperm counts in some men. This may make it more difficult to father a child. Talk to your health care professional if you are concerned about  your fertility. What side effects may I notice from receiving this medicine? Side effects that you should report to your doctor or health care professional as soon as possible:  allergic reactions like skin rash, itching or hives, swelling of the face, lips, or tongue  chest pain  diarrhea  flushing, runny nose, sweating during infusion  low blood counts - this medicine may decrease the number of white blood cells, red blood cells and platelets. You may be at increased risk for infections and bleeding.  nausea, vomiting  pain, swelling, warmth in the leg  signs of decreased platelets or bleeding - bruising, pinpoint red spots on the skin, black, tarry stools, blood in the urine  signs of infection - fever or chills, cough, sore throat, pain or difficulty passing urine  signs of decreased red blood cells - unusually weak or tired, fainting spells, lightheadedness Side effects that usually do not require medical attention (report to your doctor or health care professional if they continue or are bothersome):  constipation  hair loss  headache  loss of appetite  mouth sores  stomach pain This list may not describe all possible side effects. Call your doctor for medical advice about side effects. You may report side effects to FDA at 1-800-FDA-1088. Where should I keep my medicine? This drug is  given in a hospital or clinic and will not be stored at home. NOTE: This sheet is a summary. It may not cover all possible information. If you have questions about this medicine, talk to your doctor, pharmacist, or health care provider.  2021 Elsevier/Gold Standard (2019-07-12 17:46:13) Oxaliplatin Injection What is this medicine? OXALIPLATIN (ox AL i PLA tin) is a chemotherapy drug. It targets fast dividing cells, like cancer cells, and causes these cells to die. This medicine is used to treat cancers of the colon and rectum, and many other cancers. This medicine may be used for other  purposes; ask your health care provider or pharmacist if you have questions. COMMON BRAND NAME(S): Eloxatin What should I tell my health care provider before I take this medicine? They need to know if you have any of these conditions:  heart disease  history of irregular heartbeat  liver disease  low blood counts, like white cells, platelets, or red blood cells  lung or breathing disease, like asthma  take medicines that treat or prevent blood clots  tingling of the fingers or toes, or other nerve disorder  an unusual or allergic reaction to oxaliplatin, other chemotherapy, other medicines, foods, dyes, or preservatives  pregnant or trying to get pregnant  breast-feeding How should I use this medicine? This drug is given as an infusion into a vein. It is administered in a hospital or clinic by a specially trained health care professional. Talk to your pediatrician regarding the use of this medicine in children. Special care may be needed. Overdosage: If you think you have taken too much of this medicine contact a poison control center or emergency room at once. NOTE: This medicine is only for you. Do not share this medicine with others. What if I miss a dose? It is important not to miss a dose. Call your doctor or health care professional if you are unable to keep an appointment. What may interact with this medicine? Do not take this medicine with any of the following medications:  cisapride  dronedarone  pimozide  thioridazine This medicine may also interact with the following medications:  aspirin and aspirin-like medicines  certain medicines that treat or prevent blood clots like warfarin, apixaban, dabigatran, and rivaroxaban  cisplatin  cyclosporine  diuretics  medicines for infection like acyclovir, adefovir, amphotericin B, bacitracin, cidofovir, foscarnet, ganciclovir, gentamicin, pentamidine, vancomycin  NSAIDs, medicines for pain and inflammation, like  ibuprofen or naproxen  other medicines that prolong the QT interval (an abnormal heart rhythm)  pamidronate  zoledronic acid This list may not describe all possible interactions. Give your health care provider a list of all the medicines, herbs, non-prescription drugs, or dietary supplements you use. Also tell them if you smoke, drink alcohol, or use illegal drugs. Some items may interact with your medicine. What should I watch for while using this medicine? Your condition will be monitored carefully while you are receiving this medicine. You may need blood work done while you are taking this medicine. This medicine may make you feel generally unwell. This is not uncommon as chemotherapy can affect healthy cells as well as cancer cells. Report any side effects. Continue your course of treatment even though you feel ill unless your healthcare professional tells you to stop. This medicine can make you more sensitive to cold. Do not drink cold drinks or use ice. Cover exposed skin before coming in contact with cold temperatures or cold objects. When out in cold weather wear warm clothing and cover your  mouth and nose to warm the air that goes into your lungs. Tell your doctor if you get sensitive to the cold. Do not become pregnant while taking this medicine or for 9 months after stopping it. Women should inform their health care professional if they wish to become pregnant or think they might be pregnant. Men should not father a child while taking this medicine and for 6 months after stopping it. There is potential for serious side effects to an unborn child. Talk to your health care professional for more information. Do not breast-feed a child while taking this medicine or for 3 months after stopping it. This medicine has caused ovarian failure in some women. This medicine may make it more difficult to get pregnant. Talk to your health care professional if you are concerned about your fertility. This  medicine has caused decreased sperm counts in some men. This may make it more difficult to father a child. Talk to your health care professional if you are concerned about your fertility. This medicine may increase your risk of getting an infection. Call your health care professional for advice if you get a fever, chills, or sore throat, or other symptoms of a cold or flu. Do not treat yourself. Try to avoid being around people who are sick. Avoid taking medicines that contain aspirin, acetaminophen, ibuprofen, naproxen, or ketoprofen unless instructed by your health care professional. These medicines may hide a fever. Be careful brushing or flossing your teeth or using a toothpick because you may get an infection or bleed more easily. If you have any dental work done, tell your dentist you are receiving this medicine. What side effects may I notice from receiving this medicine? Side effects that you should report to your doctor or health care professional as soon as possible:  allergic reactions like skin rash, itching or hives, swelling of the face, lips, or tongue  breathing problems  cough  low blood counts - this medicine may decrease the number of white blood cells, red blood cells, and platelets. You may be at increased risk for infections and bleeding  nausea, vomiting  pain, redness, or irritation at site where injected  pain, tingling, numbness in the hands or feet  signs and symptoms of bleeding such as bloody or black, tarry stools; red or dark [place] urine; spitting up blood or Arvanitis material that looks like coffee grounds; red spots on the skin; unusual bruising or bleeding from the eyes, gums, or nose  signs and symptoms of a dangerous change in heartbeat or heart rhythm like chest pain; dizziness; fast, irregular heartbeat; palpitations; feeling faint or lightheaded; falls  signs and symptoms of infection like fever; chills; cough; sore throat; pain or trouble passing  urine  signs and symptoms of liver injury like dark yellow or [place] urine; general ill feeling or flu-like symptoms; light-colored stools; loss of appetite; nausea; right upper belly pain; unusually weak or tired; yellowing of the eyes or skin  signs and symptoms of low red blood cells or anemia such as unusually weak or tired; feeling faint or lightheaded; falls  signs and symptoms of muscle injury like dark urine; trouble passing urine or change in the amount of urine; unusually weak or tired; muscle pain; back pain Side effects that usually do not require medical attention (report to your doctor or health care professional if they continue or are bothersome):  changes in taste  diarrhea  gas  hair loss  loss of appetite  mouth sores This  list may not describe all possible side effects. Call your doctor for medical advice about side effects. You may report side effects to FDA at 1-800-FDA-1088. Where should I keep my medicine? This drug is given in a hospital or clinic and will not be stored at home. NOTE: This sheet is a summary. It may not cover all possible information. If you have questions about this medicine, talk to your doctor, pharmacist, or health care provider.  2021 Elsevier/Gold Standard (2018-12-29 12:20:35)       To help prevent nausea and vomiting after your treatment, we encourage you to take your nausea medication as directed.  BELOW ARE SYMPTOMS THAT SHOULD BE REPORTED IMMEDIATELY: . *FEVER GREATER THAN 100.4 F (38 C) OR HIGHER . *CHILLS OR SWEATING . *NAUSEA AND VOMITING THAT IS NOT CONTROLLED WITH YOUR NAUSEA MEDICATION . *UNUSUAL SHORTNESS OF BREATH . *UNUSUAL BRUISING OR BLEEDING . *URINARY PROBLEMS (pain or burning when urinating, or frequent urination) . *BOWEL PROBLEMS (unusual diarrhea, constipation, pain near the anus) . TENDERNESS IN MOUTH AND THROAT WITH OR WITHOUT PRESENCE OF ULCERS (sore throat, sores in mouth, or a toothache) . UNUSUAL RASH,  SWELLING OR PAIN  . UNUSUAL VAGINAL DISCHARGE OR ITCHING   Items with * indicate a potential emergency and should be followed up as soon as possible or go to the Emergency Department if any problems should occur.  Please show the CHEMOTHERAPY ALERT CARD or IMMUNOTHERAPY ALERT CARD at check-in to the Emergency Department and triage nurse.  Should you have questions after your visit or need to cancel or reschedule your appointment, please contact [facility]  [phone] and follow the prompts.  Office hours are 8:00 a.m. to 4:30 p.m. Monday - Friday. Please note that voicemails left after 4:00 p.m. may not be returned until the following business day.  We are closed weekends and major holidays. You have access to a nurse at all times for urgent questions. Please call the main number to the clinic [phone] and follow the prompts.  For any non-urgent questions, you may also contact your provider using MyChart. We now offer e-Visits for anyone 61 and older to request care online for non-urgent symptoms. For details visit [email or web address].   Also download the MyChart app! Go to the app store, search "MyChart", open the app, select [facility], and log in with your MyChart username and password.  Due to Covid, a mask is required upon entering the hospital/clinic. If you do not have a mask, one will be given to you upon arrival. For doctor visits, patients may have 1 support person aged 18 or older with them. For treatment visits, patients cannot have anyone with them due to current Covid guidelines and our immunocompromised population.

## 2020-12-24 NOTE — Progress Notes (Signed)
[place] CONSULT NOTE  Patient Care Team: [facility] as PCP - General Clent Jacks, RN as Oncology Nurse Navigator  CHIEF COMPLAINTS/PURPOSE OF CONSULTATION: Pancreatic cancer   Oncology History Overview Note  # April 2022- [name],  BIOPSY:  - METASTATIC PANCREATIC ACINAR CELL CARCINOMA.; STAGE IV; CT scan shows-approximately 8cm centimeter pancreatic mass; adrenal lesion; also approximately 6 cm liver lesion.   ; LIPASE NORMAL.   # April 18ht, 2022- FOLFIRINOX; Ellen Henri.   There is sufficient material for ancillary molecular studies if needed  (A1, A3, A4, A2)      # NGS/MOLECULAR TESTS: BRCA-1*    # PALLIATIVE CARE EVALUATION:  # PAIN MANAGEMENT:    DIAGNOSIS:   STAGE:         ;  GOALS:  CURRENT/MOST RECENT THERAPY :     Pancreatic cancer metastasized to liver ([place])  11/23/2020 Initial Diagnosis   Pancreatic cancer metastasized to liver ([place])   11/28/2020 Cancer Staging   Staging form: Exocrine Pancreas, AJCC 8th Edition - Clinical: Stage IV (cM1) - Signed by [name], MD on 11/28/2020 Histopathologic type: Acinar cell carcinoma   12/10/2020 -  Chemotherapy    Patient is on Treatment Plan: PANCREAS MODIFIED FOLFIRINOX Q14D X 4 CYCLES         HISTORY OF PRESENTING ILLNESS:  Dan [name] 55 y.o.  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  In the interim patient was also evaluated at [facility] for second opinion.  Patient denies any significant nausea vomiting.  Admits to couple of episodes of diarrhea currently improved.  Otherwise denies any tingling or numbness.  Denies any sores in the mouth.  Has been taking Percocet 2-3 times a day.  Review of Systems  Constitutional: Negative for chills, diaphoresis, fever, malaise/fatigue and weight loss.  HENT: Negative for nosebleeds and sore throat.   Eyes: Negative for double vision.   Respiratory: Negative for cough, hemoptysis, sputum production, shortness of breath and wheezing.   Cardiovascular: Negative for chest pain, palpitations, orthopnea and leg swelling.  Gastrointestinal: Positive for abdominal pain and nausea. Negative for blood in stool, constipation, diarrhea, heartburn, melena and vomiting.  Genitourinary: Negative for dysuria, frequency and urgency.  Musculoskeletal: Negative for back pain and joint pain.  Skin: Negative.  Negative for itching and rash.  Neurological: Negative for dizziness, tingling, focal weakness, weakness and headaches.  Endo/Heme/Allergies: Does not bruise/bleed easily.  Psychiatric/Behavioral: Negative for depression. The patient is not nervous/anxious and does not have insomnia.      MEDICAL HISTORY:  Past Medical History:  Diagnosis Date  . Acid reflux   . Asthma   . Diabetes mellitus without complication ([place])   . History of kidney stones   . Hypertension   . Kidney stones     SURGICAL HISTORY: Past Surgical History:  Procedure Laterality Date  . COLONOSCOPY WITH PROPOFOL N/A 12/02/2019   Procedure: COLONOSCOPY WITH PROPOFOL;  Surgeon: [name], MD;  Location: [facility] ENDOSCOPY;  Service: Gastroenterology;  Laterality: N/A;  . FRACTURE SURGERY Left at age 57   pelvic fracture  . HEMORRHOID SURGERY N/A 01/31/2020   Procedure: HEMORRHOIDECTOMY;  Surgeon: Jules Husbands, MD;  Location: [facility] ORS;  Service: General;  Laterality: N/A;  . IR IMAGING GUIDED PORT INSERTION  11/30/2020  . pelvis fractur    . ROTATOR CUFF REPAIR      SOCIAL HISTORY: Social History   Socioeconomic History  .  Marital status: Married    Spouse name: Not on file  . Number of children: Not on file  . Years of education: Not on file  . Highest education level: Not on file  Occupational History  . Not on file  Tobacco Use  . Smoking status: Former Smoker    Packs/day: 0.50    Years: 25.00    Pack years: 12.50    Types: Cigarettes  . Smokeless  tobacco: Never Used  Vaping Use  . Vaping Use: Never used  Substance and Sexual Activity  . Alcohol use: Yes    Alcohol/week: 6.0 standard drinks    Types: 6 Cans of beer per week    Comment: weekends only  . Drug use: No  . Sexual activity: Yes  Other Topics Concern  . Not on file  Social History Narrative   Works at [employer]. In [place]. Quit smoking 2021 June. Social. Alcohol.    Social Determinants of Health   Financial Resource Strain: Not on file  Food Insecurity: Not on file  Transportation Needs: Not on file  Physical Activity: Not on file  Stress: Not on file  Social Connections: Not on file  Intimate Partner Violence: Not on file    FAMILY HISTORY: Family History  Problem Relation Age of Onset  . Cancer Sister        2 sisters 63 and 23- both died of breast cancer  . Diabetes Brother   . Hypertension Brother   . Heart attack Father   . Asthma Son     ALLERGIES:  is allergic to [facility] [aprepitant], cherry, ibuprofen, other, and tramadol.  MEDICATIONS:  Current Outpatient Medications  Medication Sig Dispense Refill  . amLODipine (NORVASC) 10 MG tablet TAKE 1 TABLET(10 MG) BY MOUTH DAILY (Patient taking differently: Take 10 mg by mouth daily.) 90 tablet 1  . chlorthalidone (HYGROTON) 25 MG tablet Take 25 mg by mouth every morning.    Marland Kitchen JARDIANCE 10 MG TABS tablet Take 10 mg by mouth daily.    Marland Kitchen lidocaine-prilocaine (EMLA) cream Apply 1 application topically as needed (prior to port a cath needle access on the days of chemotherapy). 30 g 3  . losartan (COZAAR) 100 MG tablet TAKE 1 TABLET(100 MG) BY MOUTH DAILY (Patient taking differently: Take 100 mg by mouth daily.) 90 tablet 1  . omeprazole (PRILOSEC) 40 MG capsule Take 1 capsule (40 mg total) by mouth daily. 30 capsule 3  . ondansetron (ZOFRAN) 8 MG tablet Take 1 tablet (8 mg total) by mouth every 8 (eight) hours as needed for nausea, vomiting or refractory nausea / vomiting. 20 tablet 3  . potassium chloride SA  (KLOR-CON) 20 MEQ tablet 1 pill twice a day 60 tablet 3  . prochlorperazine (COMPAZINE) 10 MG tablet Take 1 tablet (10 mg total) by mouth every 6 (six) hours as needed for nausea or vomiting. 30 tablet 3  . morphine (MS CONTIN) 30 MG 12 hr tablet Take 1 tablet (30 mg total) by mouth every 12 (twelve) hours. 30 tablet 0  . oxyCODONE-acetaminophen (PERCOCET) 7.5-325 MG tablet Take 1 tablet by mouth every 8 (eight) hours as needed for severe pain. 60 tablet 0   No current facility-administered medications for this visit.   Facility-Administered Medications Ordered in Other Visits  Medication Dose Route Frequency Provider Last Rate Last Admin  . fluorouracil (ADRUCIL) 5,350 mg in sodium chloride 0.9 % 143 mL chemo infusion  2,400 mg/m2 (Treatment Plan Recorded) Intravenous 1 day or 1 dose Akiko Schexnider,  Elisha Headland, MD   5,350 mg at 12/24/20 1415  . heparin lock flush 100 unit/mL  500 Units Intracatheter Once PRN Charlaine Dalton R, MD      . sodium chloride flush (NS) 0.9 % injection 10 mL  10 mL Intravenous PRN [name], MD   10 mL at 12/24/20 0805      .  PHYSICAL EXAMINATION: ECOG PERFORMANCE STATUS: 1 - Symptomatic but completely ambulatory  Vitals:   12/24/20 0830  BP: 121/76  Pulse: 81  Resp: 16  Temp: 98.2 F (36.8 C)  SpO2: 97%   Filed Weights   12/24/20 0830  Weight: 213 lb (96.6 kg)    Physical Exam Constitutional:      Comments: Accompanied by wife.  Ambulating independently.  HENT:     Head: Normocephalic and atraumatic.     Mouth/Throat:     Pharynx: No oropharyngeal exudate.  Eyes:     Pupils: Pupils are equal, round, and reactive to light.  Cardiovascular:     Rate and Rhythm: Normal rate and regular rhythm.  Pulmonary:     Effort: Pulmonary effort is normal. No respiratory distress.     Breath sounds: Normal breath sounds. No wheezing.  Abdominal:     General: Bowel sounds are normal. There is no distension.     Palpations: Abdomen is soft.  There is no mass.     Tenderness: There is no abdominal tenderness. There is no guarding or rebound.  Musculoskeletal:        General: No tenderness. Normal range of motion.     Cervical back: Normal range of motion and neck supple.  Skin:    General: Skin is warm.  Neurological:     Mental Status: He is alert and oriented to person, place, and time.  Psychiatric:        Mood and Affect: Affect normal.      LABORATORY DATA:  I have reviewed the data as listed Lab Results  Component Value Date   WBC 9.3 12/24/2020   HGB 11.6 (L) 12/24/2020   HCT 35.8 (L) 12/24/2020   MCV 82.9 12/24/2020   PLT 147 (L) 12/24/2020   Recent Labs    01/27/20 1012 05/08/20 1955 05/09/20 0921 05/09/20 0921 11/19/20 0745 11/20/20 0514 12/10/20 0832 12/24/20 0805  NA 140 132* 135  --  136 138 136 138  K 3.9 4.0 4.0  --  3.1* 3.7 3.2* 3.2*  CL 105 93* 95*  --  100 103 100 99  CO2 _0 --  _1 GLUCOSE 106* 592* 449*  --  172* 148* 223* 276*  BUN 13 26* 22*  --  _2 CREATININE 0.90 1.14 1.07  --  1.10 1.09 1.02 1.07  CALCIUM 9.1 9.8 9.6  --  9.2 9.2 9.2 9.2  GFRNONAA >60 >60 >60   < > >60 >60 >60 >60  GFRAA >60 >60 >60  --   --   --   --   --   PROT  --   --   --   --  8.1  --  7.5 7.3  ALBUMIN  --   --   --   --  4.0  --  3.9 4.0  AST  --   --   --   --  51*  --  25 29  ALT  --   --   --   --  44  --  28 27  ALKPHOS  --   --   --   --  89  --  105 123  BILITOT  --   --   --   --  0.9  --  0.8 0.4   < > = values in this interval not displayed.    RADIOGRAPHIC STUDIES: I have personally reviewed the radiological images as listed and agreed with the findings in the report. NM PET Image Initial (PI) Skull Base To Thigh  Result Date: 12/07/2020 CLINICAL DATA:  Initial treatment strategy for metastatic pancreatic cancer. Liver metastasis documented on percutaneous biopsy 11/20/2020. EXAM: NUCLEAR MEDICINE PET SKULL BASE TO THIGH TECHNIQUE: 11.76 mCi F-18 FDG was injected  intravenously. Full-ring PET imaging was performed from the skull base to thigh after the radiotracer. CT data was obtained and used for attenuation correction and anatomic localization. Fasting blood glucose: 208 mg/dl COMPARISON:  Abdominopelvic CT 11/19/2020.  Chest CT 06/07/2013 FINDINGS: Mediastinal blood pool activity: SUV max 2.4 Liver activity: 3.7 NECK: No hypermetabolic cervical lymph nodes are identified.There are no lesions of the pharyngeal mucosal space. Incidental CT findings: none CHEST: There are no hypermetabolic mediastinal, hilar or axillary lymph nodes. No hypermetabolic pulmonary activity or suspicious pulmonary nodularity. Incidental CT findings: Right IJ central venous catheter projects to the superior cavoatrial junction. There is atherosclerosis of the aorta and coronary arteries. Mild emphysematous changes are present. ABDOMEN/PELVIS: There is no hypermetabolic activity within the liver. The large irregular peripheral lesion in the right hepatic lobe (segment 7) measures 7.3 x 5.5 cm on image 130/3 (previously 6.3 x 4.9 cm). If anything, this demonstrates slightly decreased metabolic activity (SUV max 3.0) relative to background liver. Likewise, the large heterogeneous mass involving pancreatic tail demonstrates only mild hypermetabolic activity with an SUV max of 3.7. This measures approximately 8.3 x 7.5 cm on image 132/3, similar to prior CT. The adjacent lymph nodes and mildly enlarged left adrenal gland show no significant hypermetabolic activity. Incidental CT findings: Aortic and branch vessel atherosclerosis. No ascites or peritoneal nodularity. SKELETON: There is mild hypermetabolic activity within the L5 vertebral body, corresponding with diffuse sclerosis on CT. This sclerosis was present on the remote 2014 CT, but has progressed. This has an SUV max of 3.8. No other hypermetabolic osseous lesions are identified. Incidental CT findings: none IMPRESSION: 1. The known pancreatic  tail mass, adjacent adenopathy and dominant lesion in the right hepatic lobe do not show significant hypermetabolic activity in this patient with known metastatic pancreatic cancer. This limits the sensitivity of this study for detecting metastatic disease. There is limited assessment of the previously demonstrated small left adrenal nodule. 2. Low-level metabolic activity throughout the L5 vertebral body which demonstrates increased sclerosis compared with remote CT nearly 8 years ago. Typical features of Paget's disease are not seen on recent CT, although the relative stability of this finding argues against metastatic disease. Electronically Signed   By: Richardean Sale M.D.   On: 12/07/2020 15:54   IR IMAGING GUIDED PORT INSERTION  Result Date: 11/30/2020 CLINICAL DATA:  Metastatic pancreatic carcinoma and need for porta cath to begin chemotherapy. EXAM: IMPLANTED PORT A CATH PLACEMENT WITH ULTRASOUND AND FLUOROSCOPIC GUIDANCE ANESTHESIA/SEDATION: 2.0 mg IV Versed; 75 mcg IV Fentanyl Total Moderate Sedation Time:  28 minutes The patient's level of consciousness and physiologic status were continuously monitored during the procedure by Radiology nursing. FLUOROSCOPY TIME:  18 seconds.  2.7 mGy. PROCEDURE: The procedure, risks, benefits, and alternatives were explained to the patient. Questions regarding  the procedure were encouraged and answered. The patient understands and consents to the procedure. A time-out was performed prior to initiating the procedure. Ultrasound was utilized to confirm patency of the right internal jugular vein. The right neck and chest were prepped with chlorhexidine in a sterile fashion, and a sterile drape was applied covering the operative field. Maximum barrier sterile technique with sterile gowns and gloves were used for the procedure. Local anesthesia was provided with 1% lidocaine. After creating a small venotomy incision, a 21 gauge needle was advanced into the right internal  jugular vein under direct, real-time ultrasound guidance. Ultrasound image documentation was performed. After securing guidewire access, an 8 Fr dilator was placed. A J-wire was kinked to measure appropriate catheter length. A subcutaneous port pocket was then created along the upper chest wall utilizing sharp and blunt dissection. Portable cautery was utilized. The pocket was irrigated with sterile saline. A single lumen power injectable port was chosen for placement. The 8 Fr catheter was tunneled from the port pocket site to the venotomy incision. The port was placed in the pocket. External catheter was trimmed to appropriate length based on guidewire measurement. At the venotomy, an 8 Fr peel-away sheath was placed over a guidewire. The catheter was then placed through the sheath and the sheath removed. Final catheter positioning was confirmed and documented with a fluoroscopic spot image. The port was accessed with a needle and aspirated and flushed with heparinized saline. The access needle was removed. The venotomy and port pocket incisions were closed with subcutaneous 3-0 Monocryl and subcuticular 4-0 Vicryl. Dermabond was applied to both incisions. COMPLICATIONS: COMPLICATIONS None FINDINGS: After catheter placement, the tip lies at the cavo-atrial junction. The catheter aspirates normally and is ready for immediate use. IMPRESSION: Placement of single lumen port a cath via right internal jugular vein. The catheter tip lies at the cavo-atrial junction. A power injectable port a cath was placed and is ready for immediate use. Electronically Signed   By: Aletta Edouard M.D.   On: 11/30/2020 13:51    ASSESSMENT & PLAN:   Pancreatic cancer metastasized to liver [facility]) #Pancreatic acinar cell cancer with metastasis to liver. STAGE IV.  Currently on palliative chemotherapy with FOLFIRINOX.  DiscussFoundation one- BRCA-1.   #Proceed with cycle #2 of FOLFIRINOX today. Labs today reviewed;  acceptable for  treatment today. Discontinue Elwin Sleight below]; discussed that we will plan follow-up imaging after 6 cycles.  # ? Allergy to Emend- chest tightness; back pain/ab-pain; discontinued.   # Pain control: Secondary malignancy stable continue Percocet 7.5 20 tabs a da; MS Contin twice daily refilled.   #Diabetes-continue current medication-FBS/ PPF ~200-230; recommend monitoring blood sugars closely on chemotherapy if elevated then 300 recommend treating her PCP.   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  # [place] grade 1 secondary chemotherapy.  Recommend aggressive hydration.  # PORT placement: lifting weight ~60 pounds; ? Restrictions.   # DISPOSITION:  # chemo today; pump off d-3 # ADD- d-3-undeyca # follow up in 2 weeks MD; labs- cbc/cmp;FOLFORINOX; day-3 pump off; udenyca- Dr.B  All questions were answered. The patient knows to call the clinic with any problems, questions or concerns.    [name], MD 12/24/2020 4:57 PM

## 2020-12-24 NOTE — Progress Notes (Signed)
During treatment patient stated, "My hands feel like they are cramping and they have a tingling feeling."  Dr. Rogue Bussing is aware. No new orders. Informed patient to let his doctor know if it worsens and if it starts to effect his ADL.

## 2020-12-24 NOTE — Progress Notes (Signed)
Has noticed a lot of indigestion since starting treatment.

## 2020-12-24 NOTE — Assessment & Plan Note (Addendum)
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  Currently on palliative chemotherapy with FOLFIRINOX.  DiscussFoundation one- BRCA-1.   #Proceed with cycle #2 of FOLFIRINOX today. Labs today reviewed;  acceptable for treatment today. Discontinue Elwin Sleight below]; discussed that we will plan follow-up imaging after 6 cycles.  # ? Allergy to Emend- chest tightness; back pain/ab-pain; discontinued.   # Pain control: Secondary malignancy stable continue Percocet 7.5 20 tabs a da; MS Contin twice daily refilled.   #Diabetes-continue current medication-FBS/ PPF ~200-230; recommend monitoring blood sugars closely on chemotherapy if elevated then 300 recommend treating her PCP.   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  # [place] grade 1 secondary chemotherapy.  Recommend aggressive hydration.  # PORT placement: lifting weight ~60 pounds; ? Restrictions.   # DISPOSITION:  # chemo today; pump off d-3 # ADD- d-3-undeyca # follow up in 2 weeks MD; labs- cbc/cmp;FOLFORINOX; day-3 pump off; udenyca- Dr.B

## 2020-12-26 ENCOUNTER — Inpatient Hospital Stay: Payer: BLUE CROSS/BLUE SHIELD

## 2020-12-26 DIAGNOSIS — Z5111 Encounter for antineoplastic chemotherapy: Secondary | ICD-10-CM | POA: Diagnosis not present

## 2020-12-26 DIAGNOSIS — C259 Malignant neoplasm of pancreas, unspecified: Secondary | ICD-10-CM

## 2020-12-26 DIAGNOSIS — C787 Secondary malignant neoplasm of liver and intrahepatic bile duct: Secondary | ICD-10-CM

## 2020-12-26 MED ORDER — PEGFILGRASTIM-CBQV 6 MG/0.6ML ~~LOC~~ SOSY
6.0000 mg | PREFILLED_SYRINGE | Freq: Once | SUBCUTANEOUS | Status: AC
  Administered 2020-12-26: 6 mg via SUBCUTANEOUS
  Filled 2020-12-26: qty 0.6

## 2020-12-26 MED ORDER — SODIUM CHLORIDE 0.9% FLUSH
10.0000 mL | INTRAVENOUS | Status: DC | PRN
  Administered 2020-12-26: 10 mL
  Filled 2020-12-26: qty 10

## 2020-12-26 MED ORDER — HEPARIN SOD (PORK) LOCK FLUSH 100 UNIT/ML IV SOLN
500.0000 [IU] | Freq: Once | INTRAVENOUS | Status: AC | PRN
  Administered 2020-12-26: 500 [IU]
  Filled 2020-12-26: qty 5

## 2020-12-26 MED ORDER — HEPARIN SOD (PORK) LOCK FLUSH 100 UNIT/ML IV SOLN
INTRAVENOUS | Status: AC
  Filled 2020-12-26: qty 5

## 2020-12-26 NOTE — Telephone Encounter (Signed)
PA denied for foundation one. Insurance recommends MD calling [phone] to discuss denial. Otherwise an appeal will be needed.

## 2020-12-27 ENCOUNTER — Telehealth: Payer: Self-pay | Admitting: Internal Medicine

## 2020-12-27 NOTE — Telephone Encounter (Signed)
Spoke to insurance physician declined foundation 1 testing.  However, need to obtain "post claim service"-when the claim number would be given.    H-When resubmitting the claim-please answer yes to question- "is Dan [name] an option  if found to have high tumor burden".   Please check with me if any questions.   Thanks GB

## 2020-12-27 NOTE — Telephone Encounter (Signed)
Did it get approved?

## 2020-12-27 NOTE — Telephone Encounter (Signed)
Dr. Jacinto Reap- Please have Lovena Le call for you today as previously discussed.

## 2020-12-28 NOTE — Telephone Encounter (Signed)
It was NOT approved.  GB

## 2020-12-31 ENCOUNTER — Telehealth: Payer: Self-pay | Admitting: *Deleted

## 2020-12-31 ENCOUNTER — Encounter: Payer: Self-pay | Admitting: *Deleted

## 2020-12-31 NOTE — Telephone Encounter (Signed)
Contacted claims appeal at [employer]. Obtained fax # for appeals for foundation One testing. Faxed appeals letter and records to 1 [phone].  I also left a detailed vm for the appeal's claims nurse regarding the need for the appeal.  Fax confirmation received for appeals claim.

## 2020-12-31 NOTE — Telephone Encounter (Signed)
1600-Dan [name] from [employer] returned my phone call. She will attach the faxed appeal letter and information to the claim and fwd it to the [employer].

## 2020-12-31 NOTE — Telephone Encounter (Signed)
Appeals claim faxed

## 2021-01-01 ENCOUNTER — Telehealth: Payer: Self-pay | Admitting: *Deleted

## 2021-01-01 NOTE — Telephone Encounter (Signed)
Urgent External Apeal- Expedited to [place] records and letter for appeal to [place] at [phone]

## 2021-01-01 NOTE — Telephone Encounter (Signed)
Per [employer] with [employer]- "foundation one testing appeal level 1- declined." The clinician stated that after review of the letter and the records, the test did not meet medical necessity. She will fax over a letter with the steps for a higher level appeal

## 2021-01-03 ENCOUNTER — Telehealth: Payer: Self-pay | Admitting: *Deleted

## 2021-01-03 NOTE — Telephone Encounter (Signed)
Incoming fax from [employer]. Patient's insurance denied Foundation One. The first appeal to the denial was denied and a 2nd level appeal was also denied. Both appeals stated that the foundation One DX did not meet medical necessity.    Kristi- Do you have any other insight on how we can assist further appeals/financial assistance with Foundation one?

## 2021-01-04 ENCOUNTER — Telehealth: Payer: Self-pay

## 2021-01-04 NOTE — Telephone Encounter (Signed)
I have spoken to a representative from [place] regarding financial assistance. We can submit a financial assistance application to assist with billing. I will meet with Dan [name] during his appointment on 5/16 and we will submit the application. Attempted to call Dan [name] and received voicemail.

## 2021-01-07 ENCOUNTER — Inpatient Hospital Stay (HOSPITAL_BASED_OUTPATIENT_CLINIC_OR_DEPARTMENT_OTHER): Payer: BLUE CROSS/BLUE SHIELD | Admitting: Oncology

## 2021-01-07 ENCOUNTER — Inpatient Hospital Stay: Payer: BLUE CROSS/BLUE SHIELD

## 2021-01-07 ENCOUNTER — Encounter: Payer: Self-pay | Admitting: Oncology

## 2021-01-07 ENCOUNTER — Telehealth: Payer: Self-pay

## 2021-01-07 ENCOUNTER — Other Ambulatory Visit: Payer: Self-pay

## 2021-01-07 VITALS — BP 121/83 | HR 100 | Temp 98.2°F | Resp 18 | Wt 202.8 lb

## 2021-01-07 DIAGNOSIS — R112 Nausea with vomiting, unspecified: Secondary | ICD-10-CM

## 2021-01-07 DIAGNOSIS — Z95828 Presence of other vascular implants and grafts: Secondary | ICD-10-CM | POA: Diagnosis not present

## 2021-01-07 DIAGNOSIS — C259 Malignant neoplasm of pancreas, unspecified: Secondary | ICD-10-CM

## 2021-01-07 DIAGNOSIS — T451X5A Adverse effect of antineoplastic and immunosuppressive drugs, initial encounter: Secondary | ICD-10-CM

## 2021-01-07 DIAGNOSIS — G893 Neoplasm related pain (acute) (chronic): Secondary | ICD-10-CM

## 2021-01-07 DIAGNOSIS — Z5111 Encounter for antineoplastic chemotherapy: Secondary | ICD-10-CM

## 2021-01-07 DIAGNOSIS — C787 Secondary malignant neoplasm of liver and intrahepatic bile duct: Secondary | ICD-10-CM

## 2021-01-07 LAB — CBC WITH DIFFERENTIAL/PLATELET
Abs Immature Granulocytes: 1.05 10*3/uL — ABNORMAL HIGH (ref 0.00–0.07)
Basophils Absolute: 0.1 10*3/uL (ref 0.0–0.1)
Basophils Relative: 1 %
Eosinophils Absolute: 0.7 10*3/uL — ABNORMAL HIGH (ref 0.0–0.5)
Eosinophils Relative: 5 %
HCT: 37.5 % — ABNORMAL LOW (ref 39.0–52.0)
Hemoglobin: 12.3 g/dL — ABNORMAL LOW (ref 13.0–17.0)
Immature Granulocytes: 7 %
Lymphocytes Relative: 15 %
Lymphs Abs: 2.1 10*3/uL (ref 0.7–4.0)
MCH: 27.2 pg (ref 26.0–34.0)
MCHC: 32.8 g/dL (ref 30.0–36.0)
MCV: 83 fL (ref 80.0–100.0)
Monocytes Absolute: 1.1 10*3/uL — ABNORMAL HIGH (ref 0.1–1.0)
Monocytes Relative: 8 %
Neutro Abs: 9.4 10*3/uL — ABNORMAL HIGH (ref 1.7–7.7)
Neutrophils Relative %: 64 %
Platelets: 175 10*3/uL (ref 150–400)
RBC: 4.52 MIL/uL (ref 4.22–5.81)
RDW: 14.9 % (ref 11.5–15.5)
WBC: 14.4 10*3/uL — ABNORMAL HIGH (ref 4.0–10.5)
nRBC: 0.1 % (ref 0.0–0.2)

## 2021-01-07 LAB — COMPREHENSIVE METABOLIC PANEL
ALT: 28 U/L (ref 0–44)
AST: 22 U/L (ref 15–41)
Albumin: 4.4 g/dL (ref 3.5–5.0)
Alkaline Phosphatase: 150 U/L — ABNORMAL HIGH (ref 38–126)
Anion gap: 11 (ref 5–15)
BUN: 15 mg/dL (ref 6–20)
CO2: 26 mmol/L (ref 22–32)
Calcium: 9.4 mg/dL (ref 8.9–10.3)
Chloride: 101 mmol/L (ref 98–111)
Creatinine, Ser: 0.97 mg/dL (ref 0.61–1.24)
GFR, Estimated: >60 mL/min (ref >60)
Glucose, Bld: 302 mg/dL — ABNORMAL HIGH (ref 70–99)
Potassium: 3.5 mmol/L (ref 3.5–5.1)
Sodium: 138 mmol/L (ref 135–145)
Total Bilirubin: 0.6 mg/dL (ref 0.3–1.2)
Total Protein: 7.9 g/dL (ref 6.5–8.1)

## 2021-01-07 MED ORDER — ATROPINE SULFATE 1 MG/ML IJ SOLN
0.5000 mg | Freq: Once | INTRAMUSCULAR | Status: AC | PRN
  Administered 2021-01-07: 0.5 mg via INTRAVENOUS
  Filled 2021-01-07: qty 1

## 2021-01-07 MED ORDER — SODIUM CHLORIDE 0.9 % IV SOLN
405.0000 mg/m2 | Freq: Once | INTRAVENOUS | Status: AC
  Administered 2021-01-07: 900 mg via INTRAVENOUS
  Filled 2021-01-07: qty 45

## 2021-01-07 MED ORDER — DEXTROSE 5 % IV SOLN
Freq: Once | INTRAVENOUS | Status: AC
Start: 2021-01-07 — End: 2021-01-07
  Filled 2021-01-07: qty 250

## 2021-01-07 MED ORDER — SODIUM CHLORIDE 0.9 % IV SOLN
10.0000 mg | Freq: Once | INTRAVENOUS | Status: AC
  Administered 2021-01-07: 10 mg via INTRAVENOUS
  Filled 2021-01-07: qty 10

## 2021-01-07 MED ORDER — SODIUM CHLORIDE 0.9 % IV SOLN
150.0000 mg/m2 | Freq: Once | INTRAVENOUS | Status: AC
  Administered 2021-01-07: 340 mg via INTRAVENOUS
  Filled 2021-01-07: qty 15

## 2021-01-07 MED ORDER — OXALIPLATIN CHEMO INJECTION 100 MG/20ML
85.0000 mg/m2 | Freq: Once | INTRAVENOUS | Status: AC
  Administered 2021-01-07: 190 mg via INTRAVENOUS
  Filled 2021-01-07: qty 38

## 2021-01-07 MED ORDER — PALONOSETRON HCL INJECTION 0.25 MG/5ML
0.2500 mg | Freq: Once | INTRAVENOUS | Status: AC
Start: 2021-01-07 — End: 2021-01-07
  Administered 2021-01-07: 0.25 mg via INTRAVENOUS
  Filled 2021-01-07: qty 5

## 2021-01-07 MED ORDER — SODIUM CHLORIDE 0.9 % IV SOLN
2400.0000 mg/m2 | INTRAVENOUS | Status: DC
  Administered 2021-01-07: 5350 mg via INTRAVENOUS
  Filled 2021-01-07: qty 107

## 2021-01-07 NOTE — Progress Notes (Addendum)
Nutrition Assessment   Reason for Assessment:  RD screen, weight loss, pancreatic cancer   ASSESSMENT:  55 year old male with metastatic pancreatic cancer with mets to liver.  Past medical history of DM, HTN, GERD.  Patient receiving folfininox.    Met with patient during infusion.  Patient reports that he is eating.  Has been eating more vegetables and fruits and less meat recently. Denies nausea, diarrhea.  Reports issues with reflux. Says that breakfast is chicken biscuit. Lunch is usually sandwich (chicken salad, bolonga).  Dinner is usually meat with vegetables (last night fried chicken, coleslaw, baked beans, mashed potatoes. Last evening at butter pecan ice cream.      Medications: jardiance, prilosec, zofran, KCL, compazine   Labs: glucose 302   Anthropometrics:   Height: 72 inches Weight: 202 lb 12.8 oz today 213 lb on 5/2 BMI: 27  5% weight loss in the last 2 weeks, significant   Estimated Energy Needs  Kcals: 2300 Protein: 115 g Fluid: 2.3 L   NUTRITION DIAGNOSIS: Inadequate oral intake related to cancer and cancer related treatment side effects as evidenced by 5% weight loss in the last 2 weeks.     INTERVENTION:  Reviewed importance of weight maintenance during treatment.   Encouraged good sources of protein and reviewed foods that contain protein Patient with questions regarding eating shellfish.  RD answered questions Encouraged patient to keep check on blood glucose and notify MD if increasing.  Would prefer not to place diet restrictions on patient as on chemotherapy but rather adjust medications to control blood glucose.    MONITORING, EVALUATION, GOAL: weight trends, intake   Next Visit: Tuesday, May 31 during infusion  Kayle Passarelli B. Zenia Resides, [place], [place] Registered Dietitian [phone] (mobile)

## 2021-01-07 NOTE — Progress Notes (Signed)
Patient here for follow up. No new concerns voiced.  °

## 2021-01-07 NOTE — Progress Notes (Signed)
Dan [name] CONSULT NOTE  Patient Care Team: [facility] as PCP - General Dan Jacks, RN as Oncology Nurse Navigator  CHIEF COMPLAINTS/PURPOSE OF CONSULTATION: Pancreatic cancer   Oncology History Overview Note  # April 2022- [name],  BIOPSY:  - METASTATIC PANCREATIC ACINAR CELL CARCINOMA.; STAGE IV; CT scan shows-approximately 8cm centimeter pancreatic mass; adrenal lesion; also approximately 6 cm liver lesion.   ; LIPASE NORMAL.   # April 18ht, 2022- FOLFIRINOX; Dan [name].   There is sufficient material for ancillary molecular studies if needed  (A1, A3, A4, A2)      # NGS/MOLECULAR TESTS: BRCA-1*    # PALLIATIVE CARE EVALUATION:  # PAIN MANAGEMENT:    DIAGNOSIS:   STAGE:         ;  GOALS:  CURRENT/MOST RECENT THERAPY :     Pancreatic cancer metastasized to liver (Dan [name])  11/23/2020 Initial Diagnosis   Pancreatic cancer metastasized to liver (Dan [name])   11/28/2020 Cancer Staging   Staging form: Exocrine Pancreas, AJCC 8th Edition - Clinical: Stage IV (cM1) - Signed by Dan Sickle, MD on 11/28/2020 Histopathologic type: Acinar cell carcinoma   12/10/2020 -  Chemotherapy    Patient is on Treatment Plan: PANCREAS MODIFIED FOLFIRINOX Q14D X 4 CYCLES         HISTORY OF PRESENTING ILLNESS:  Dan [name] 55 y.o.  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 Patient has been on modified FOLFIRINOX chemotherapy treatments.  Overall he tolerates well.  Emend was discontinued due to possible allergic reaction after cycle 1. Patient denies nausea vomiting diarrhea,  Review of Systems  Constitutional: Negative for chills, diaphoresis, fever, malaise/fatigue and weight loss.  HENT: Negative for nosebleeds and sore throat.   Eyes: Negative for double vision.  Respiratory: Negative for cough, hemoptysis, sputum production, shortness of breath and wheezing.    Cardiovascular: Negative for chest pain, palpitations, orthopnea and leg swelling.  Gastrointestinal: Positive for nausea. Negative for abdominal pain, blood in stool, constipation, diarrhea, heartburn, melena and vomiting.  Genitourinary: Negative for dysuria, frequency and urgency.  Musculoskeletal: Negative for back pain and joint pain.  Skin: Negative.  Negative for itching and rash.  Neurological: Negative for dizziness, tingling, focal weakness, weakness and headaches.  Endo/Heme/Allergies: Does not bruise/bleed easily.  Psychiatric/Behavioral: Negative for depression. The patient is not nervous/anxious and does not have insomnia.      MEDICAL HISTORY:  Past Medical History:  Diagnosis Date  . Acid reflux   . Asthma   . Diabetes mellitus without complication ([place])   . History of kidney stones   . Hypertension   . Kidney stones     SURGICAL HISTORY: Past Surgical History:  Procedure Laterality Date  . COLONOSCOPY WITH PROPOFOL N/A 12/02/2019   Procedure: COLONOSCOPY WITH PROPOFOL;  Surgeon: Dan Bellows, MD;  Location: [facility] ENDOSCOPY;  Service: Gastroenterology;  Laterality: N/A;  . FRACTURE SURGERY Left at age 3   pelvic fracture  . HEMORRHOID SURGERY N/A 01/31/2020   Procedure: HEMORRHOIDECTOMY;  Surgeon: Dan Husbands, MD;  Location: [facility] ORS;  Service: General;  Laterality: N/A;  . IR IMAGING GUIDED PORT INSERTION  11/30/2020  . pelvis fractur    . ROTATOR CUFF REPAIR      SOCIAL HISTORY: Social History   Socioeconomic History  . Marital status: Married    Spouse name: Not on file  . Number of children: Not on file  . Years of  education: Not on file  . Highest education level: Not on file  Occupational History  . Not on file  Tobacco Use  . Smoking status: Former Smoker    Packs/day: 0.50    Years: 25.00    Pack years: 12.50    Types: Cigarettes  . Smokeless tobacco: Never Used  Vaping Use  . Vaping Use: Never used  Substance and Sexual Activity  . Alcohol  use: Yes    Alcohol/week: 6.0 standard drinks    Types: 6 Cans of beer per week    Comment: weekends only  . Drug use: No  . Sexual activity: Yes  Other Topics Concern  . Not on file  Social History Narrative   Works at [employer]. In [place]. Quit smoking 2021 June. Social. Alcohol.    Social Determinants of Health   Financial Resource Strain: Not on file  Food Insecurity: Not on file  Transportation Needs: Not on file  Physical Activity: Not on file  Stress: Not on file  Social Connections: Not on file  Intimate Partner Violence: Not on file    FAMILY HISTORY: Family History  Problem Relation Age of Onset  . Cancer Sister        2 sisters 99 and 51- both died of breast cancer  . Diabetes Brother   . Hypertension Brother   . Heart attack Father   . Asthma Son     ALLERGIES:  is allergic to [facility] [aprepitant], cherry, ibuprofen, other, and tramadol.  MEDICATIONS:  Current Outpatient Medications  Medication Sig Dispense Refill  . amLODipine (NORVASC) 10 MG tablet TAKE 1 TABLET(10 MG) BY MOUTH DAILY (Patient taking differently: Take 10 mg by mouth daily.) 90 tablet 1  . chlorthalidone (HYGROTON) 25 MG tablet Take 25 mg by mouth every morning.    Marland Kitchen JARDIANCE 10 MG TABS tablet Take 10 mg by mouth daily.    Marland Kitchen lidocaine-prilocaine (EMLA) cream Apply 1 application topically as needed (prior to port a cath needle access on the days of chemotherapy). 30 g 3  . losartan (COZAAR) 100 MG tablet TAKE 1 TABLET(100 MG) BY MOUTH DAILY (Patient taking differently: Take 100 mg by mouth daily.) 90 tablet 1  . morphine (MS CONTIN) 30 MG 12 hr tablet Take 1 tablet (30 mg total) by mouth every 12 (twelve) hours. 30 tablet 0  . omeprazole (PRILOSEC) 40 MG capsule Take 1 capsule (40 mg total) by mouth daily. 30 capsule 3  . ondansetron (ZOFRAN) 8 MG tablet Take 1 tablet (8 mg total) by mouth every 8 (eight) hours as needed for nausea, vomiting or refractory nausea / vomiting. 20 tablet 3  .  oxyCODONE-acetaminophen (PERCOCET) 7.5-325 MG tablet Take 1 tablet by mouth every 8 (eight) hours as needed for severe pain. 60 tablet 0  . potassium chloride SA (KLOR-CON) 20 MEQ tablet 1 pill twice a day 60 tablet 3  . prochlorperazine (COMPAZINE) 10 MG tablet Take 1 tablet (10 mg total) by mouth every 6 (six) hours as needed for nausea or vomiting. 30 tablet 3  . FLOVENT HFA 110 MCG/ACT inhaler Inhale 1 puff into the lungs 2 (two) times daily.     No current facility-administered medications for this visit.   Facility-Administered Medications Ordered in Other Visits  Medication Dose Route Frequency Provider Last Rate Last Admin  . fluorouracil (ADRUCIL) 5,350 mg in sodium chloride 0.9 % 143 mL chemo infusion  2,400 mg/m2 (Treatment Plan Recorded) Intravenous 1 day or 1 dose Rickard Patience, MD  5,350 mg at 01/07/21 1447      .  PHYSICAL EXAMINATION: ECOG PERFORMANCE STATUS: 1 - Symptomatic but completely ambulatory  Vitals:   01/07/21 0908  BP: 121/83  Pulse: 100  Resp: 18  Temp: 98.2 F (36.8 C)   Filed Weights   01/07/21 0908  Weight: 202 lb 12.8 oz (92 kg)    Physical Exam Constitutional:      Comments: Accompanied by wife.  Ambulating independently.  HENT:     Head: Normocephalic and atraumatic.     Mouth/Throat:     Pharynx: No oropharyngeal exudate.  Eyes:     Pupils: Pupils are equal, round, and reactive to light.  Cardiovascular:     Rate and Rhythm: Normal rate and regular rhythm.  Pulmonary:     Effort: Pulmonary effort is normal. No respiratory distress.     Breath sounds: Normal breath sounds. No wheezing.  Abdominal:     General: Bowel sounds are normal. There is no distension.     Palpations: Abdomen is soft. There is no mass.     Tenderness: There is no abdominal tenderness. There is no guarding or rebound.  Musculoskeletal:        General: No tenderness. Normal range of motion.     Cervical back: Normal range of motion and neck supple.  Skin:     General: Skin is warm.  Neurological:     Mental Status: He is alert and oriented to person, place, and time.  Psychiatric:        Mood and Affect: Affect normal.      LABORATORY DATA:  I have reviewed the data as listed Lab Results  Component Value Date   WBC 14.4 (H) 01/07/2021   HGB 12.3 (L) 01/07/2021   HCT 37.5 (L) 01/07/2021   MCV 83.0 01/07/2021   PLT 175 01/07/2021   Recent Labs    01/27/20 1012 05/08/20 1955 05/09/20 0921 11/19/20 0745 12/10/20 0832 12/24/20 0805 01/07/21 0817  NA 140 132* 135   < > 136 138 138  K 3.9 4.0 4.0   < > 3.2* 3.2* 3.5  CL 105 93* 95*   < > 100 99 101  CO2 $Re'27 28 29   'IXv$ < > $R'27 25 26  'TJ$ GLUCOSE 106* 592* 449*   < > 223* 276* 302*  BUN 13 26* 22*   < > $R'16 13 15  'qI$ CREATININE 0.90 1.14 1.07   < > 1.02 1.07 0.97  CALCIUM 9.1 9.8 9.6   < > 9.2 9.2 9.4  GFRNONAA >60 >60 >60   < > >60 >60 >60  GFRAA >60 >60 >60  --   --   --   --   PROT  --   --   --    < > 7.5 7.3 7.9  ALBUMIN  --   --   --    < > 3.9 4.0 4.4  AST  --   --   --    < > $R'25 29 22  'ST$ ALT  --   --   --    < > $R'28 27 28  'ca$ ALKPHOS  --   --   --    < > 105 123 150*  BILITOT  --   --   --    < > 0.8 0.4 0.6   < > = values in this interval not displayed.    RADIOGRAPHIC STUDIES: I have personally reviewed the radiological images as listed and agreed with  the findings in the report. No results found.  ASSESSMENT & PLAN:  1. Pancreatic cancer metastasized to liver ([place])   2. Port-A-Cath in place   3. Chemotherapy induced nausea and vomiting   4. Encounter for antineoplastic chemotherapy   5. Neoplasm related pain   Cancer Staging Pancreatic cancer metastasized to liver [facility]) Staging form: Exocrine Pancreas, AJCC 8th Edition - Clinical: Stage IV (cM1) - Signed by Dan Sickle, MD on 11/28/2020   #Metastatic pancreatic cell carcinoma with liver mets.  Currently on palliative chemotherapy with FOLFIRINOX. Overall he tolerates well. Labs reviewed and discussed with  patient. Proceed with cycle 3 treatments-day 3 Udenyca. Due to possible allergy reaction to Emend, premedication dexamethasone and Aloxi, Emend has been discontinued since cycle 2.  #Neoplasm induced pain, continue current pain regimen with Percocet and MS Contin #Uncontrolled diabetes.  Blood sugar is 300.  He admits not compliant with diabetic diet.  Discussed about the importance of diabetic diet and he is motivated.  I recommend patient to check his glucose level 1-2 times daily and keep a log.  Recommend patient to further discuss with primary care provider for further adjustment of his diabetic regimen. Encourage oral hydration.  #Chemotherapy-induced nausea and vomiting, stable symptoms.  Antiemetics as needed. #Hypokalemia, potassium has normalized.  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 #Diarrhea, due to chemotherapy.  Encourage oral hydration. Patient will Follow up with Dr. B in 2 weeks lab MD FOLFIRINOX day 3 Udenyca. All questions were answered. The patient knows to call the clinic with any problems, questions or concerns.    Earlie Server, MD 01/07/2021 7:52 PM

## 2021-01-07 NOTE — Telephone Encounter (Signed)
Met with Dan [name] in clinic. Applied for foundation medicine financial assistance.

## 2021-01-07 NOTE — Patient Instructions (Signed)
[facility]  Discharge Instructions: Thank you for choosing [facility] to provide your oncology and hematology care.  If you have a lab appointment with the Cancer Center, please go directly to the Cancer Center and check in at the registration area.  Wear comfortable clothing and clothing appropriate for easy access to any Portacath or PICC line.   We strive to give you quality time with your provider. You may need to reschedule your appointment if you arrive late (15 or more minutes).  Arriving late affects you and other patients whose appointments are after yours.  Also, if you miss three or more appointments without notifying the office, you may be dismissed from the clinic at the provider's discretion.      For prescription refill requests, have your pharmacy contact our office and allow 72 hours for refills to be completed.    Today you received the following chemotherapy and/or immunotherapy agents Oxaliplatin, Leucovorin, Irinotecan and Adrucil       To help prevent nausea and vomiting after your treatment, we encourage you to take your nausea medication as directed.  BELOW ARE SYMPTOMS THAT SHOULD BE REPORTED IMMEDIATELY: *FEVER GREATER THAN 100.4 F (38 C) OR HIGHER *CHILLS OR SWEATING *NAUSEA AND VOMITING THAT IS NOT CONTROLLED WITH YOUR NAUSEA MEDICATION *UNUSUAL SHORTNESS OF BREATH *UNUSUAL BRUISING OR BLEEDING *URINARY PROBLEMS (pain or burning when urinating, or frequent urination) *BOWEL PROBLEMS (unusual diarrhea, constipation, pain near the anus) TENDERNESS IN MOUTH AND THROAT WITH OR WITHOUT PRESENCE OF ULCERS (sore throat, sores in mouth, or a toothache) UNUSUAL RASH, SWELLING OR PAIN  UNUSUAL VAGINAL DISCHARGE OR ITCHING   Items with * indicate a potential emergency and should be followed up as soon as possible or go to the Emergency Department if any problems should occur.  Please show the CHEMOTHERAPY ALERT CARD or  IMMUNOTHERAPY ALERT CARD at check-in to the Emergency Department and triage nurse.  Should you have questions after your visit or need to cancel or reschedule your appointment, please contact [facility]  [phone] and follow the prompts.  Office hours are 8:00 a.m. to 4:30 p.m. Monday - Friday. Please note that voicemails left after 4:00 p.m. may not be returned until the following business day.  We are closed weekends and major holidays. You have access to a nurse at all times for urgent questions. Please call the main number to the clinic [phone] and follow the prompts.  For any non-urgent questions, you may also contact your provider using MyChart. We now offer e-Visits for anyone 18 and older to request care online for non-urgent symptoms. For details visit [email or web address].   Also download the MyChart app! Go to the app store, search "MyChart", open the app, select , and log in with your MyChart username and password.  Due to Covid, a mask is required upon entering the hospital/clinic. If you do not have a mask, one will be given to you upon arrival. For doctor visits, patients may have 1 support person aged 18 or older with them. For treatment visits, patients cannot have anyone with them due to current Covid guidelines and our immunocompromised population.  

## 2021-01-08 ENCOUNTER — Telehealth: Payer: Self-pay

## 2021-01-08 NOTE — Telephone Encounter (Signed)
Notified Dan [name] that he received 100% coverage for his [facility] testing. He will received an email from [employer] with his reward details.

## 2021-01-09 ENCOUNTER — Other Ambulatory Visit: Payer: Self-pay

## 2021-01-09 ENCOUNTER — Inpatient Hospital Stay: Payer: BLUE CROSS/BLUE SHIELD

## 2021-01-09 DIAGNOSIS — C787 Secondary malignant neoplasm of liver and intrahepatic bile duct: Secondary | ICD-10-CM

## 2021-01-09 DIAGNOSIS — C259 Malignant neoplasm of pancreas, unspecified: Secondary | ICD-10-CM

## 2021-01-09 DIAGNOSIS — Z5111 Encounter for antineoplastic chemotherapy: Secondary | ICD-10-CM | POA: Diagnosis not present

## 2021-01-09 MED ORDER — HEPARIN SOD (PORK) LOCK FLUSH 100 UNIT/ML IV SOLN
INTRAVENOUS | Status: AC
  Filled 2021-01-09: qty 5

## 2021-01-09 MED ORDER — SODIUM CHLORIDE 0.9% FLUSH
10.0000 mL | INTRAVENOUS | Status: DC | PRN
  Administered 2021-01-09: 10 mL
  Filled 2021-01-09: qty 10

## 2021-01-09 MED ORDER — PEGFILGRASTIM-CBQV 6 MG/0.6ML ~~LOC~~ SOSY
6.0000 mg | PREFILLED_SYRINGE | Freq: Once | SUBCUTANEOUS | Status: AC
  Administered 2021-01-09: 6 mg via SUBCUTANEOUS
  Filled 2021-01-09: qty 0.6

## 2021-01-09 MED ORDER — HEPARIN SOD (PORK) LOCK FLUSH 100 UNIT/ML IV SOLN
500.0000 [IU] | Freq: Once | INTRAVENOUS | Status: AC | PRN
  Administered 2021-01-09: 500 [IU]
  Filled 2021-01-09: qty 5

## 2021-01-22 ENCOUNTER — Other Ambulatory Visit: Payer: Self-pay | Admitting: *Deleted

## 2021-01-22 ENCOUNTER — Inpatient Hospital Stay: Payer: BLUE CROSS/BLUE SHIELD

## 2021-01-22 ENCOUNTER — Inpatient Hospital Stay (HOSPITAL_BASED_OUTPATIENT_CLINIC_OR_DEPARTMENT_OTHER): Payer: BLUE CROSS/BLUE SHIELD | Admitting: Internal Medicine

## 2021-01-22 ENCOUNTER — Encounter: Payer: Self-pay | Admitting: Internal Medicine

## 2021-01-22 DIAGNOSIS — C259 Malignant neoplasm of pancreas, unspecified: Secondary | ICD-10-CM | POA: Diagnosis not present

## 2021-01-22 DIAGNOSIS — C787 Secondary malignant neoplasm of liver and intrahepatic bile duct: Secondary | ICD-10-CM

## 2021-01-22 DIAGNOSIS — Z5111 Encounter for antineoplastic chemotherapy: Secondary | ICD-10-CM | POA: Diagnosis not present

## 2021-01-22 LAB — CBC WITH DIFFERENTIAL/PLATELET
Abs Immature Granulocytes: 0.61 10*3/uL — ABNORMAL HIGH (ref 0.00–0.07)
Basophils Absolute: 0.1 10*3/uL (ref 0.0–0.1)
Basophils Relative: 1 %
Eosinophils Absolute: 0.9 10*3/uL — ABNORMAL HIGH (ref 0.0–0.5)
Eosinophils Relative: 6 %
HCT: 36.7 % — ABNORMAL LOW (ref 39.0–52.0)
Hemoglobin: 11.9 g/dL — ABNORMAL LOW (ref 13.0–17.0)
Immature Granulocytes: 4 %
Lymphocytes Relative: 15 %
Lymphs Abs: 2.2 10*3/uL (ref 0.7–4.0)
MCH: 26.6 pg (ref 26.0–34.0)
MCHC: 32.4 g/dL (ref 30.0–36.0)
MCV: 81.9 fL (ref 80.0–100.0)
Monocytes Absolute: 1.1 10*3/uL — ABNORMAL HIGH (ref 0.1–1.0)
Monocytes Relative: 7 %
Neutro Abs: 9.8 10*3/uL — ABNORMAL HIGH (ref 1.7–7.7)
Neutrophils Relative %: 67 %
Platelets: 148 10*3/uL — ABNORMAL LOW (ref 150–400)
RBC: 4.48 MIL/uL (ref 4.22–5.81)
RDW: 15.6 % — ABNORMAL HIGH (ref 11.5–15.5)
WBC: 14.7 10*3/uL — ABNORMAL HIGH (ref 4.0–10.5)
nRBC: 0 % (ref 0.0–0.2)

## 2021-01-22 LAB — COMPREHENSIVE METABOLIC PANEL
ALT: 31 U/L (ref 0–44)
AST: 32 U/L (ref 15–41)
Albumin: 4.2 g/dL (ref 3.5–5.0)
Alkaline Phosphatase: 173 U/L — ABNORMAL HIGH (ref 38–126)
Anion gap: 14 (ref 5–15)
BUN: 18 mg/dL (ref 6–20)
CO2: 25 mmol/L (ref 22–32)
Calcium: 9.3 mg/dL (ref 8.9–10.3)
Chloride: 97 mmol/L — ABNORMAL LOW (ref 98–111)
Creatinine, Ser: 0.96 mg/dL (ref 0.61–1.24)
GFR, Estimated: >60 mL/min (ref >60)
Glucose, Bld: 332 mg/dL — ABNORMAL HIGH (ref 70–99)
Potassium: 3.1 mmol/L — ABNORMAL LOW (ref 3.5–5.1)
Sodium: 136 mmol/L (ref 135–145)
Total Bilirubin: 0.7 mg/dL (ref 0.3–1.2)
Total Protein: 7.5 g/dL (ref 6.5–8.1)

## 2021-01-22 MED ORDER — SODIUM CHLORIDE 0.9% FLUSH
10.0000 mL | Freq: Once | INTRAVENOUS | Status: AC
  Administered 2021-01-22: 10 mL via INTRAVENOUS
  Filled 2021-01-22: qty 10

## 2021-01-22 MED ORDER — SODIUM CHLORIDE 0.9 % IV SOLN
150.0000 mg/m2 | Freq: Once | INTRAVENOUS | Status: AC
  Administered 2021-01-22: 340 mg via INTRAVENOUS
  Filled 2021-01-22: qty 15

## 2021-01-22 MED ORDER — DEXTROSE 5 % IV SOLN
Freq: Once | INTRAVENOUS | Status: AC
  Filled 2021-01-22: qty 250

## 2021-01-22 MED ORDER — MORPHINE SULFATE ER 30 MG PO TBCR: 30.0000 mg | EXTENDED_RELEASE_TABLET | Freq: Two times a day (BID) | ORAL | 0 refills | Status: DC

## 2021-01-22 MED ORDER — OXALIPLATIN CHEMO INJECTION 100 MG/20ML
85.0000 mg/m2 | Freq: Once | INTRAVENOUS | Status: AC
  Administered 2021-01-22: 190 mg via INTRAVENOUS
  Filled 2021-01-22: qty 38

## 2021-01-22 MED ORDER — SODIUM CHLORIDE 0.9 % IV SOLN
2400.0000 mg/m2 | INTRAVENOUS | Status: DC
  Administered 2021-01-22: 5350 mg via INTRAVENOUS
  Filled 2021-01-22: qty 107

## 2021-01-22 MED ORDER — SODIUM CHLORIDE 0.9 % IV SOLN
900.0000 mg | Freq: Once | INTRAVENOUS | Status: AC
  Administered 2021-01-22: 900 mg via INTRAVENOUS
  Filled 2021-01-22: qty 45

## 2021-01-22 MED ORDER — PALONOSETRON HCL INJECTION 0.25 MG/5ML
0.2500 mg | Freq: Once | INTRAVENOUS | Status: AC
  Administered 2021-01-22: 0.25 mg via INTRAVENOUS
  Filled 2021-01-22: qty 5

## 2021-01-22 MED ORDER — OXYCODONE-ACETAMINOPHEN 7.5-325 MG PO TABS: 1.0000 | ORAL_TABLET | Freq: Three times a day (TID) | ORAL | 0 refills | Status: DC | PRN

## 2021-01-22 MED ORDER — ATROPINE SULFATE 1 MG/ML IJ SOLN
0.5000 mg | Freq: Once | INTRAMUSCULAR | Status: AC | PRN
  Administered 2021-01-22: 0.5 mg via INTRAVENOUS
  Filled 2021-01-22: qty 1

## 2021-01-22 MED ORDER — HEPARIN SOD (PORK) LOCK FLUSH 100 UNIT/ML IV SOLN
500.0000 [IU] | Freq: Once | INTRAVENOUS | Status: DC
  Filled 2021-01-22: qty 5

## 2021-01-22 MED ORDER — DEXAMETHASONE SODIUM PHOSPHATE 100 MG/10ML IJ SOLN
10.0000 mg | Freq: Once | INTRAMUSCULAR | Status: AC
Start: 2021-01-22 — End: 2021-01-22
  Administered 2021-01-22: 10 mg via INTRAVENOUS
  Filled 2021-01-22: qty 10

## 2021-01-22 NOTE — Progress Notes (Signed)
Nutrition Follow-up:  Patient with metastatic pancreatic cancer with mets to liver.  Patient receiving folfirinox.    Met with patient during infusion.  Patient reports that his appetite has decreased and food tastes like wax.  Reports that he can taste lemon and blueberries.  Reports tongue is sore.  Reports that yesterday he ate cereal, toast and eggs for breakfast. Lunch was 4 beef tacos and supper was small steak with squash and zucchini and baked potato.  Snacked on honey dew melon.    Reports forgot to take blood sugar medicine this am. Has been drinking regular sprite some and had milkshake (strawberry cheesecake) from [place] recently.      Medications: jardiance  Labs: glucose 332, K 3.1  Anthropometrics:   Weight is 197 lb today  202 lb 12.8 oz on 5/16 213 lb on 5/2  NUTRITION DIAGNOSIS: Inadequate oral intake continues   INTERVENTION:  Discussed strategies to help with taste change.  Handout provided Encouraged patient to not skip DM medication.  Encouraged patient to choose sugar free beverages to help control blood glucose.  Would prefer not to place many diet restrictions as patient with decreased intake, taste change and metastatic pancreatic cancer.    MONITORING, EVALUATION, GOAL: weight trends, intake   NEXT VISIT: to be determined with treatment  Dan [name] B. Zenia Resides, [place], [place] Registered Dietitian [phone] (mobile)

## 2021-01-22 NOTE — Progress Notes (Signed)
Patient here for pre treatment check he reports that he has lost his taste for food, sore tongue.

## 2021-01-22 NOTE — Assessment & Plan Note (Signed)
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  Currently on palliative chemotherapy with FOLFIRINOX.  DiscussFoundation one- BRCA-1.   #Proceed with cycle #3 of FOLFIRINOX today. Labs today reviewed;  acceptable for treatment today. Discontinue Elwin Sleight below]; discussed that we will plan follow-up imaging after 6 cycles.  # Pain control: Secondary malignancy stable continue Percocet 7.5 20 tabs a da; MS Contin twice daily refilled.   #Diabetes-continue current medication-FBS/ PPF ~300; recommend monitoring blood sugars closely on chemotherapy if elevated then 300 recommend treating her PCP.   # Oral mucositis- recommend salt water/biotene rinses.   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  # weight loss/ poor taste recommend evaluation- awaiting evaluation with Joli today.   # short term disability-[June 3rd] pt will get back to me   # DISPOSITION:  # chemo today; pump off d-3 # follow up ion 6/13-MD; labs- cbc/cmp;FOLFORINOX;possible KCL 20 meq; day-3 pump off; udenyca- Dr.B

## 2021-01-22 NOTE — Patient Instructions (Signed)
[facility]  Discharge Instructions: Thank you for choosing [facility] to provide your oncology and hematology care.  If you have a lab appointment with the [facility], please go directly to the [facility] and check in at the registration area.  Wear comfortable clothing and clothing appropriate for easy access to any Portacath or PICC line.   We strive to give you quality time with your provider. You may need to reschedule your appointment if you arrive late (15 or more minutes).  Arriving late affects you and other patients whose appointments are after yours.  Also, if you miss three or more appointments without notifying the office, you may be dismissed from the clinic at the provider's discretion.      For prescription refill requests, have your pharmacy contact our office and allow 72 hours for refills to be completed.    Today you received the following chemotherapy and/or immunotherapy agents: Oxaliplatin, Irinotecan, 55fu pump      To help prevent nausea and vomiting after your treatment, we encourage you to take your nausea medication as directed.  BELOW ARE SYMPTOMS THAT SHOULD BE REPORTED IMMEDIATELY: . *FEVER GREATER THAN 100.4 F (38 C) OR HIGHER . *CHILLS OR SWEATING . *NAUSEA AND VOMITING THAT IS NOT CONTROLLED WITH YOUR NAUSEA MEDICATION . *UNUSUAL SHORTNESS OF BREATH . *UNUSUAL BRUISING OR BLEEDING . *URINARY PROBLEMS (pain or burning when urinating, or frequent urination) . *BOWEL PROBLEMS (unusual diarrhea, constipation, pain near the anus) . TENDERNESS IN MOUTH AND THROAT WITH OR WITHOUT PRESENCE OF ULCERS (sore throat, sores in mouth, or a toothache) . UNUSUAL RASH, SWELLING OR PAIN  . UNUSUAL VAGINAL DISCHARGE OR ITCHING   Items with * indicate a potential emergency and should be followed up as soon as possible or go to the Emergency Department if any problems should occur.  Please show the CHEMOTHERAPY ALERT  CARD or IMMUNOTHERAPY ALERT CARD at check-in to the Emergency Department and triage nurse.  Should you have questions after your visit or need to cancel or reschedule your appointment, please contact [facility]  [phone] and follow the prompts.  Office hours are 8:00 a.m. to 4:30 p.m. Monday - Friday. Please note that voicemails left after 4:00 p.m. may not be returned until the following business day.  We are closed weekends and major holidays. You have access to a nurse at all times for urgent questions. Please call the main number to the clinic [phone] and follow the prompts.  For any non-urgent questions, you may also contact your provider using MyChart. We now offer e-Visits for anyone 32 and older to request care online for non-urgent symptoms. For details visit [email or web address].   Also download the MyChart app! Go to the app store, search "MyChart", open the app, select [facility], and log in with your MyChart username and password.  Due to Covid, a mask is required upon entering the hospital/clinic. If you do not have a mask, one will be given to you upon arrival. For doctor visits, patients may have 1 support person aged 61 or older with them. For treatment visits, patients cannot have anyone with them due to current Covid guidelines and our immunocompromised population. Fluorouracil, 5-FU injection What is this medicine? FLUOROURACIL, 5-FU (flure oh YOOR a sil) is a chemotherapy drug. It slows the growth of cancer cells. This medicine is used to treat many types of cancer like breast cancer, colon or rectal cancer, pancreatic cancer,  and stomach cancer. This medicine may be used for other purposes; ask your health care provider or pharmacist if you have questions. COMMON BRAND NAME(S): Adrucil What should I tell my health care provider before I take this medicine? They need to know if you have any of these conditions:  blood  disorders  dihydropyrimidine dehydrogenase (DPD) deficiency  infection (especially a virus infection such as chickenpox, cold sores, or herpes)  kidney disease  liver disease  malnourished, poor nutrition  recent or ongoing radiation therapy  an unusual or allergic reaction to fluorouracil, other chemotherapy, other medicines, foods, dyes, or preservatives  pregnant or trying to get pregnant  breast-feeding How should I use this medicine? This drug is given as an infusion or injection into a vein. It is administered in a hospital or clinic by a specially trained health care professional. Talk to your pediatrician regarding the use of this medicine in children. Special care may be needed. Overdosage: If you think you have taken too much of this medicine contact a poison control center or emergency room at once. NOTE: This medicine is only for you. Do not share this medicine with others. What if I miss a dose? It is important not to miss your dose. Call your doctor or health care professional if you are unable to keep an appointment. What may interact with this medicine? Do not take this medicine with any of the following medications:  live virus vaccines This medicine may also interact with the following medications:  medicines that treat or prevent blood clots like warfarin, enoxaparin, and dalteparin This list may not describe all possible interactions. Give your health care provider a list of all the medicines, herbs, non-prescription drugs, or dietary supplements you use. Also tell them if you smoke, drink alcohol, or use illegal drugs. Some items may interact with your medicine. What should I watch for while using this medicine? Visit your doctor for checks on your progress. This drug may make you feel generally unwell. This is not uncommon, as chemotherapy can affect healthy cells as well as cancer cells. Report any side effects. Continue your course of treatment even though  you feel ill unless your doctor tells you to stop. In some cases, you may be given additional medicines to help with side effects. Follow all directions for their use. Call your doctor or health care professional for advice if you get a fever, chills or sore throat, or other symptoms of a cold or flu. Do not treat yourself. This drug decreases your body's ability to fight infections. Try to avoid being around people who are sick. This medicine may increase your risk to bruise or bleed. Call your doctor or health care professional if you notice any unusual bleeding. Be careful brushing and flossing your teeth or using a toothpick because you may get an infection or bleed more easily. If you have any dental work done, tell your dentist you are receiving this medicine. Avoid taking products that contain aspirin, acetaminophen, ibuprofen, naproxen, or ketoprofen unless instructed by your doctor. These medicines may hide a fever. Do not become pregnant while taking this medicine. Women should inform their doctor if they wish to become pregnant or think they might be pregnant. There is a potential for serious side effects to an unborn child. Talk to your health care professional or pharmacist for more information. Do not breast-feed an infant while taking this medicine. Men should inform their doctor if they wish to father a child. This medicine may  lower sperm counts. Do not treat diarrhea with over the counter products. Contact your doctor if you have diarrhea that lasts more than 2 days or if it is severe and watery. This medicine can make you more sensitive to the sun. Keep out of the sun. If you cannot avoid being in the sun, wear protective clothing and use sunscreen. Do not use sun lamps or tanning beds/booths. What side effects may I notice from receiving this medicine? Side effects that you should report to your doctor or health care professional as soon as possible:  allergic reactions like skin  rash, itching or hives, swelling of the face, lips, or tongue  low blood counts - this medicine may decrease the number of white blood cells, red blood cells and platelets. You may be at increased risk for infections and bleeding.  signs of infection - fever or chills, cough, sore throat, pain or difficulty passing urine  signs of decreased platelets or bleeding - bruising, pinpoint red spots on the skin, black, tarry stools, blood in the urine  signs of decreased red blood cells - unusually weak or tired, fainting spells, lightheadedness  breathing problems  changes in vision  chest pain  mouth sores  nausea and vomiting  pain, swelling, redness at site where injected  pain, tingling, numbness in the hands or feet  redness, swelling, or sores on hands or feet  stomach pain  unusual bleeding Side effects that usually do not require medical attention (report to your doctor or health care professional if they continue or are bothersome):  changes in finger or toe nails  diarrhea  dry or itchy skin  hair loss  headache  loss of appetite  sensitivity of eyes to the light  stomach upset  unusually teary eyes This list may not describe all possible side effects. Call your doctor for medical advice about side effects. You may report side effects to FDA at 1-800-FDA-1088. Where should I keep my medicine? This drug is given in a hospital or clinic and will not be stored at home. NOTE: This sheet is a summary. It may not cover all possible information. If you have questions about this medicine, talk to your doctor, pharmacist, or health care provider.  2021 Elsevier/Gold Standard (2019-07-12 15:00:03) Irinotecan injection What is this medicine? IRINOTECAN (ir in oh TEE kan ) is a chemotherapy drug. It is used to treat colon and rectal cancer. This medicine may be used for other purposes; ask your health care provider or pharmacist if you have questions. COMMON BRAND  NAME(S): Camptosar What should I tell my health care provider before I take this medicine? They need to know if you have any of these conditions:  dehydration  diarrhea  infection (especially a virus infection such as chickenpox, cold sores, or herpes)  liver disease  low blood counts, like low white cell, platelet, or red cell counts  low levels of calcium, magnesium, or potassium in the blood  recent or ongoing radiation therapy  an unusual or allergic reaction to irinotecan, other medicines, foods, dyes, or preservatives  pregnant or trying to get pregnant  breast-feeding How should I use this medicine? This drug is given as an infusion into a vein. It is administered in a hospital or clinic by a specially trained health care professional. Talk to your pediatrician regarding the use of this medicine in children. Special care may be needed. Overdosage: If you think you have taken too much of this medicine contact a poison control  center or emergency room at once. NOTE: This medicine is only for you. Do not share this medicine with others. What if I miss a dose? It is important not to miss your dose. Call your doctor or health care professional if you are unable to keep an appointment. What may interact with this medicine? Do not take this medicine with any of the following medications:  cobicistat  itraconazole This medicine may interact with the following medications:  antiviral medicines for HIV or AIDS  certain antibiotics like rifampin or rifabutin  certain medicines for fungal infections like ketoconazole, posaconazole, and voriconazole  certain medicines for seizures like carbamazepine, phenobarbital, phenotoin  clarithromycin  gemfibrozil  nefazodone  St. John's Wort This list may not describe all possible interactions. Give your health care provider a list of all the medicines, herbs, non-prescription drugs, or dietary supplements you use. Also tell them  if you smoke, drink alcohol, or use illegal drugs. Some items may interact with your medicine. What should I watch for while using this medicine? Your condition will be monitored carefully while you are receiving this medicine. You will need important blood work done while you are taking this medicine. This drug may make you feel generally unwell. This is not uncommon, as chemotherapy can affect healthy cells as well as cancer cells. Report any side effects. Continue your course of treatment even though you feel ill unless your doctor tells you to stop. In some cases, you may be given additional medicines to help with side effects. Follow all directions for their use. You may get drowsy or dizzy. Do not drive, use machinery, or do anything that needs mental alertness until you know how this medicine affects you. Do not stand or sit up quickly, especially if you are an older patient. This reduces the risk of dizzy or fainting spells. Call your health care professional for advice if you get a fever, chills, or sore throat, or other symptoms of a cold or flu. Do not treat yourself. This medicine decreases your body's ability to fight infections. Try to avoid being around people who are sick. Avoid taking products that contain aspirin, acetaminophen, ibuprofen, naproxen, or ketoprofen unless instructed by your doctor. These medicines may hide a fever. This medicine may increase your risk to bruise or bleed. Call your doctor or health care professional if you notice any unusual bleeding. Be careful brushing and flossing your teeth or using a toothpick because you may get an infection or bleed more easily. If you have any dental work done, tell your dentist you are receiving this medicine. Do not become pregnant while taking this medicine or for 6 months after stopping it. Women should inform their health care professional if they wish to become pregnant or think they might be pregnant. Men should not father a  child while taking this medicine and for 3 months after stopping it. There is potential for serious side effects to an unborn child. Talk to your health care professional for more information. Do not breast-feed an infant while taking this medicine or for 7 days after stopping it. This medicine has caused ovarian failure in some women. This medicine may make it more difficult to get pregnant. Talk to your health care professional if you are concerned about your fertility. This medicine has caused decreased sperm counts in some men. This may make it more difficult to father a child. Talk to your health care professional if you are concerned about your fertility. What side effects may  I notice from receiving this medicine? Side effects that you should report to your doctor or health care professional as soon as possible:  allergic reactions like skin rash, itching or hives, swelling of the face, lips, or tongue  chest pain  diarrhea  flushing, runny nose, sweating during infusion  low blood counts - this medicine may decrease the number of white blood cells, red blood cells and platelets. You may be at increased risk for infections and bleeding.  nausea, vomiting  pain, swelling, warmth in the leg  signs of decreased platelets or bleeding - bruising, pinpoint red spots on the skin, black, tarry stools, blood in the urine  signs of infection - fever or chills, cough, sore throat, pain or difficulty passing urine  signs of decreased red blood cells - unusually weak or tired, fainting spells, lightheadedness Side effects that usually do not require medical attention (report to your doctor or health care professional if they continue or are bothersome):  constipation  hair loss  headache  loss of appetite  mouth sores  stomach pain This list may not describe all possible side effects. Call your doctor for medical advice about side effects. You may report side effects to FDA at  1-800-FDA-1088. Where should I keep my medicine? This drug is given in a hospital or clinic and will not be stored at home. NOTE: This sheet is a summary. It may not cover all possible information. If you have questions about this medicine, talk to your doctor, pharmacist, or health care provider.  2021 Elsevier/Gold Standard (2019-07-12 17:46:13) Leucovorin injection What is this medicine? LEUCOVORIN (loo koe VOR in) is used to prevent or treat the harmful effects of some medicines. This medicine is used to treat anemia caused by a low amount of folic acid in the body. It is also used with 5-fluorouracil (5-FU) to treat colon cancer. This medicine may be used for other purposes; ask your health care provider or pharmacist if you have questions. What should I tell my health care provider before I take this medicine? They need to know if you have any of these conditions:  anemia from low levels of vitamin B-12 in the blood  an unusual or allergic reaction to leucovorin, folic acid, other medicines, foods, dyes, or preservatives  pregnant or trying to get pregnant  breast-feeding How should I use this medicine? This medicine is for injection into a muscle or into a vein. It is given by a health care professional in a hospital or clinic setting. Talk to your pediatrician regarding the use of this medicine in children. Special care may be needed. Overdosage: If you think you have taken too much of this medicine contact a poison control center or emergency room at once. NOTE: This medicine is only for you. Do not share this medicine with others. What if I miss a dose? This does not apply. What may interact with this medicine?  capecitabine  fluorouracil  phenobarbital  phenytoin  primidone  trimethoprim-sulfamethoxazole This list may not describe all possible interactions. Give your health care provider a list of all the medicines, herbs, non-prescription drugs, or dietary  supplements you use. Also tell them if you smoke, drink alcohol, or use illegal drugs. Some items may interact with your medicine. What should I watch for while using this medicine? Your condition will be monitored carefully while you are receiving this medicine. This medicine may increase the side effects of 5-fluorouracil, 5-FU. Tell your doctor or health care professional if you  have diarrhea or mouth sores that do not get better or that get worse. What side effects may I notice from receiving this medicine? Side effects that you should report to your doctor or health care professional as soon as possible:  allergic reactions like skin rash, itching or hives, swelling of the face, lips, or tongue  breathing problems  fever, infection  mouth sores  unusual bleeding or bruising  unusually weak or tired Side effects that usually do not require medical attention (report to your doctor or health care professional if they continue or are bothersome):  constipation or diarrhea  loss of appetite  nausea, vomiting This list may not describe all possible side effects. Call your doctor for medical advice about side effects. You may report side effects to FDA at 1-800-FDA-1088. Where should I keep my medicine? This drug is given in a hospital or clinic and will not be stored at home. NOTE: This sheet is a summary. It may not cover all possible information. If you have questions about this medicine, talk to your doctor, pharmacist, or health care provider.  2021 Elsevier/Gold Standard (2008-02-15 16:50:29) Oxaliplatin Injection What is this medicine? OXALIPLATIN (ox AL i PLA tin) is a chemotherapy drug. It targets fast dividing cells, like cancer cells, and causes these cells to die. This medicine is used to treat cancers of the colon and rectum, and many other cancers. This medicine may be used for other purposes; ask your health care provider or pharmacist if you have questions. COMMON  BRAND NAME(S): Eloxatin What should I tell my health care provider before I take this medicine? They need to know if you have any of these conditions:  heart disease  history of irregular heartbeat  liver disease  low blood counts, like white cells, platelets, or red blood cells  lung or breathing disease, like asthma  take medicines that treat or prevent blood clots  tingling of the fingers or toes, or other nerve disorder  an unusual or allergic reaction to oxaliplatin, other chemotherapy, other medicines, foods, dyes, or preservatives  pregnant or trying to get pregnant  breast-feeding How should I use this medicine? This drug is given as an infusion into a vein. It is administered in a hospital or clinic by a specially trained health care professional. Talk to your pediatrician regarding the use of this medicine in children. Special care may be needed. Overdosage: If you think you have taken too much of this medicine contact a poison control center or emergency room at once. NOTE: This medicine is only for you. Do not share this medicine with others. What if I miss a dose? It is important not to miss a dose. Call your doctor or health care professional if you are unable to keep an appointment. What may interact with this medicine? Do not take this medicine with any of the following medications:  cisapride  dronedarone  pimozide  thioridazine This medicine may also interact with the following medications:  aspirin and aspirin-like medicines  certain medicines that treat or prevent blood clots like warfarin, apixaban, dabigatran, and rivaroxaban  cisplatin  cyclosporine  diuretics  medicines for infection like acyclovir, adefovir, amphotericin B, bacitracin, cidofovir, foscarnet, ganciclovir, gentamicin, pentamidine, vancomycin  NSAIDs, medicines for pain and inflammation, like ibuprofen or naproxen  other medicines that prolong the QT interval (an abnormal  heart rhythm)  pamidronate  zoledronic acid This list may not describe all possible interactions. Give your health care provider a list of all the medicines,  herbs, non-prescription drugs, or dietary supplements you use. Also tell them if you smoke, drink alcohol, or use illegal drugs. Some items may interact with your medicine. What should I watch for while using this medicine? Your condition will be monitored carefully while you are receiving this medicine. You may need blood work done while you are taking this medicine. This medicine may make you feel generally unwell. This is not uncommon as chemotherapy can affect healthy cells as well as cancer cells. Report any side effects. Continue your course of treatment even though you feel ill unless your healthcare professional tells you to stop. This medicine can make you more sensitive to cold. Do not drink cold drinks or use ice. Cover exposed skin before coming in contact with cold temperatures or cold objects. When out in cold weather wear warm clothing and cover your mouth and nose to warm the air that goes into your lungs. Tell your doctor if you get sensitive to the cold. Do not become pregnant while taking this medicine or for 9 months after stopping it. Women should inform their health care professional if they wish to become pregnant or think they might be pregnant. Men should not father a child while taking this medicine and for 6 months after stopping it. There is potential for serious side effects to an unborn child. Talk to your health care professional for more information. Do not breast-feed a child while taking this medicine or for 3 months after stopping it. This medicine has caused ovarian failure in some women. This medicine may make it more difficult to get pregnant. Talk to your health care professional if you are concerned about your fertility. This medicine has caused decreased sperm counts in some men. This may make it more  difficult to father a child. Talk to your health care professional if you are concerned about your fertility. This medicine may increase your risk of getting an infection. Call your health care professional for advice if you get a fever, chills, or sore throat, or other symptoms of a cold or flu. Do not treat yourself. Try to avoid being around people who are sick. Avoid taking medicines that contain aspirin, acetaminophen, ibuprofen, naproxen, or ketoprofen unless instructed by your health care professional. These medicines may hide a fever. Be careful brushing or flossing your teeth or using a toothpick because you may get an infection or bleed more easily. If you have any dental work done, tell your dentist you are receiving this medicine. What side effects may I notice from receiving this medicine? Side effects that you should report to your doctor or health care professional as soon as possible:  allergic reactions like skin rash, itching or hives, swelling of the face, lips, or tongue  breathing problems  cough  low blood counts - this medicine may decrease the number of white blood cells, red blood cells, and platelets. You may be at increased risk for infections and bleeding  nausea, vomiting  pain, redness, or irritation at site where injected  pain, tingling, numbness in the hands or feet  signs and symptoms of bleeding such as bloody or black, tarry stools; red or dark [place] urine; spitting up blood or Brule material that looks like coffee grounds; red spots on the skin; unusual bruising or bleeding from the eyes, gums, or nose  signs and symptoms of a dangerous change in heartbeat or heart rhythm like chest pain; dizziness; fast, irregular heartbeat; palpitations; feeling faint or lightheaded; falls  signs and  symptoms of infection like fever; chills; cough; sore throat; pain or trouble passing urine  signs and symptoms of liver injury like dark yellow or Grose urine; general  ill feeling or flu-like symptoms; light-colored stools; loss of appetite; nausea; right upper belly pain; unusually weak or tired; yellowing of the eyes or skin  signs and symptoms of low red blood cells or anemia such as unusually weak or tired; feeling faint or lightheaded; falls  signs and symptoms of muscle injury like dark urine; trouble passing urine or change in the amount of urine; unusually weak or tired; muscle pain; back pain Side effects that usually do not require medical attention (report to your doctor or health care professional if they continue or are bothersome):  changes in taste  diarrhea  gas  hair loss  loss of appetite  mouth sores This list may not describe all possible side effects. Call your doctor for medical advice about side effects. You may report side effects to FDA at 1-800-FDA-1088. Where should I keep my medicine? This drug is given in a hospital or clinic and will not be stored at home. NOTE: This sheet is a summary. It may not cover all possible information. If you have questions about this medicine, talk to your doctor, pharmacist, or health care provider.  2021 Elsevier/Gold Standard (2018-12-29 12:20:35)

## 2021-01-22 NOTE — Progress Notes (Signed)
[place] CONSULT NOTE  Patient Care Team: [facility] as PCP - General Clent Jacks, RN as Oncology Nurse Navigator  CHIEF COMPLAINTS/PURPOSE OF CONSULTATION: Pancreatic cancer   Oncology History Overview Note  # April 2022- [name],  BIOPSY:  - METASTATIC PANCREATIC ACINAR CELL CARCINOMA.; STAGE IV; CT scan shows-approximately 8cm centimeter pancreatic mass; adrenal lesion; also approximately 6 cm liver lesion.   ; LIPASE NORMAL.   # April 18ht, 2022- FOLFIRINOX; Ellen Henri.   There is sufficient material for ancillary molecular studies if needed  (A1, A3, A4, A2)      # NGS/MOLECULAR TESTS: BRCA-1*    # PALLIATIVE CARE EVALUATION:  # PAIN MANAGEMENT:    DIAGNOSIS:   STAGE:         ;  GOALS:  CURRENT/MOST RECENT THERAPY :     Pancreatic cancer metastasized to liver ([place])  11/23/2020 Initial Diagnosis   Pancreatic cancer metastasized to liver ([place])   11/28/2020 Cancer Staging   Staging form: Exocrine Pancreas, AJCC 8th Edition - Clinical: Stage IV (cM1) - Signed by [name], MD on 11/28/2020 Histopathologic type: Acinar cell carcinoma   12/10/2020 -  Chemotherapy    Patient is on Treatment Plan: PANCREAS MODIFIED FOLFIRINOX Q14D X 4 CYCLES         HISTORY OF PRESENTING ILLNESS:  Dan [name] 55 y.o.  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  Patient denies any significant nausea vomiting.  No significant diarrhea.  Possible weight loss.  Complains of poor taste.  Complains of mild to moderate fatigue.  Denies any significant tingling and numbness in extremities.  Mild soreness more.  Taking Percocet 2-3 times a day; along with MS Contin twice a day.  Review of Systems  Constitutional: Negative for chills, diaphoresis, fever, malaise/fatigue and weight loss.  HENT: Negative for nosebleeds and sore throat.   Eyes: Negative for double  vision.  Respiratory: Negative for cough, hemoptysis, sputum production, shortness of breath and wheezing.   Cardiovascular: Negative for chest pain, palpitations, orthopnea and leg swelling.  Gastrointestinal: Positive for abdominal pain and nausea. Negative for blood in stool, constipation, diarrhea, heartburn, melena and vomiting.  Genitourinary: Negative for dysuria, frequency and urgency.  Musculoskeletal: Negative for back pain and joint pain.  Skin: Negative.  Negative for itching and rash.  Neurological: Negative for dizziness, tingling, focal weakness, weakness and headaches.  Endo/Heme/Allergies: Does not bruise/bleed easily.  Psychiatric/Behavioral: Negative for depression. The patient is not nervous/anxious and does not have insomnia.      MEDICAL HISTORY:  Past Medical History:  Diagnosis Date  . Acid reflux   . Asthma   . Diabetes mellitus without complication ([place])   . History of kidney stones   . Hypertension   . Kidney stones     SURGICAL HISTORY: Past Surgical History:  Procedure Laterality Date  . COLONOSCOPY WITH PROPOFOL N/A 12/02/2019   Procedure: COLONOSCOPY WITH PROPOFOL;  Surgeon: [name], MD;  Location: [facility] ENDOSCOPY;  Service: Gastroenterology;  Laterality: N/A;  . FRACTURE SURGERY Left at age 78   pelvic fracture  . HEMORRHOID SURGERY N/A 01/31/2020   Procedure: HEMORRHOIDECTOMY;  Surgeon: Jules Husbands, MD;  Location: [facility] ORS;  Service: General;  Laterality: N/A;  . IR IMAGING GUIDED PORT INSERTION  11/30/2020  . pelvis fractur    . ROTATOR CUFF REPAIR      SOCIAL HISTORY: Social History   Socioeconomic History  .  Marital status: Married    Spouse name: Not on file  . Number of children: Not on file  . Years of education: Not on file  . Highest education level: Not on file  Occupational History  . Not on file  Tobacco Use  . Smoking status: Former Smoker    Packs/day: 0.50    Years: 25.00    Pack years: 12.50    Types: Cigarettes  .  Smokeless tobacco: Never Used  Vaping Use  . Vaping Use: Never used  Substance and Sexual Activity  . Alcohol use: Yes    Alcohol/week: 6.0 standard drinks    Types: 6 Cans of beer per week    Comment: weekends only  . Drug use: No  . Sexual activity: Yes  Other Topics Concern  . Not on file  Social History Narrative   Works at [employer]. In [place]. Quit smoking 2021 June. Social. Alcohol.    Social Determinants of Health   Financial Resource Strain: Not on file  Food Insecurity: Not on file  Transportation Needs: Not on file  Physical Activity: Not on file  Stress: Not on file  Social Connections: Not on file  Intimate Partner Violence: Not on file    FAMILY HISTORY: Family History  Problem Relation Age of Onset  . Cancer Sister        2 sisters 44 and 49- both died of breast cancer  . Diabetes Brother   . Hypertension Brother   . Heart attack Father   . Asthma Son     ALLERGIES:  is allergic to [facility] [aprepitant], cherry, ibuprofen, other, and tramadol.  MEDICATIONS:  Current Outpatient Medications  Medication Sig Dispense Refill  . amLODipine (NORVASC) 10 MG tablet TAKE 1 TABLET(10 MG) BY MOUTH DAILY (Patient taking differently: Take 10 mg by mouth daily.) 90 tablet 1  . chlorthalidone (HYGROTON) 25 MG tablet Take 25 mg by mouth every morning.    Marland Kitchen FLOVENT HFA 110 MCG/ACT inhaler Inhale 1 puff into the lungs 2 (two) times daily.    Marland Kitchen JARDIANCE 10 MG TABS tablet Take 10 mg by mouth daily.    Marland Kitchen lidocaine-prilocaine (EMLA) cream Apply 1 application topically as needed (prior to port a cath needle access on the days of chemotherapy). 30 g 3  . losartan (COZAAR) 100 MG tablet TAKE 1 TABLET(100 MG) BY MOUTH DAILY (Patient taking differently: Take 100 mg by mouth daily.) 90 tablet 1  . omeprazole (PRILOSEC) 40 MG capsule Take 1 capsule (40 mg total) by mouth daily. 30 capsule 3  . ondansetron (ZOFRAN) 8 MG tablet Take 1 tablet (8 mg total) by mouth every 8 (eight) hours as  needed for nausea, vomiting or refractory nausea / vomiting. 20 tablet 3  . oxyCODONE-acetaminophen (PERCOCET) 7.5-325 MG tablet Take 1 tablet by mouth every 8 (eight) hours as needed for severe pain. 60 tablet 0  . potassium chloride SA (KLOR-CON) 20 MEQ tablet 1 pill twice a day 60 tablet 3  . prochlorperazine (COMPAZINE) 10 MG tablet Take 1 tablet (10 mg total) by mouth every 6 (six) hours as needed for nausea or vomiting. 30 tablet 3  . morphine (MS CONTIN) 30 MG 12 hr tablet Take 1 tablet (30 mg total) by mouth every 12 (twelve) hours. 60 tablet 0   No current facility-administered medications for this visit.   Facility-Administered Medications Ordered in Other Visits  Medication Dose Route Frequency Provider Last Rate Last Admin  . fluorouracil (ADRUCIL) 5,350 mg in sodium chloride  0.9 % 143 mL chemo infusion  2,400 mg/m2 (Treatment Plan Recorded) Intravenous 1 day or 1 dose Charlaine Dalton R, MD      . heparin lock flush 100 unit/mL  500 Units Intravenous Once Charlaine Dalton R, MD      . irinotecan (CAMPTOSAR) 340 mg in sodium chloride 0.9 % 500 mL chemo infusion  150 mg/m2 (Treatment Plan Recorded) Intravenous Once [name], MD 345 mL/hr at 01/22/21 1217 340 mg at 01/22/21 1217  . leucovorin 900 mg in sodium chloride 0.9 % 250 mL infusion  900 mg Intravenous Once [name], MD 197 mL/hr at 01/22/21 1216 900 mg at 01/22/21 1216      .  PHYSICAL EXAMINATION: ECOG PERFORMANCE STATUS: 1 - Symptomatic but completely ambulatory  Vitals:   01/22/21 0831  BP: 121/81  Pulse: 87  Resp: 16  Temp: 98.1 F (36.7 C)   Filed Weights   01/22/21 0831  Weight: 89.6 kg    Physical Exam Constitutional:      Comments: Accompanied by wife.  Ambulating independently.  HENT:     Head: Normocephalic and atraumatic.     Mouth/Throat:     Pharynx: No oropharyngeal exudate.  Eyes:     Pupils: Pupils are equal, round, and reactive to light.  Cardiovascular:      Rate and Rhythm: Normal rate and regular rhythm.  Pulmonary:     Effort: Pulmonary effort is normal. No respiratory distress.     Breath sounds: Normal breath sounds. No wheezing.  Abdominal:     General: Bowel sounds are normal. There is no distension.     Palpations: Abdomen is soft. There is no mass.     Tenderness: There is no abdominal tenderness. There is no guarding or rebound.  Musculoskeletal:        General: No tenderness. Normal range of motion.     Cervical back: Normal range of motion and neck supple.  Skin:    General: Skin is warm.  Neurological:     Mental Status: He is alert and oriented to person, place, and time.  Psychiatric:        Mood and Affect: Affect normal.      LABORATORY DATA:  I have reviewed the data as listed Lab Results  Component Value Date   WBC 14.7 (H) 01/22/2021   HGB 11.9 (L) 01/22/2021   HCT 36.7 (L) 01/22/2021   MCV 81.9 01/22/2021   PLT 148 (L) 01/22/2021   Recent Labs    01/27/20 1012 05/08/20 1955 05/09/20 0921 11/19/20 0745 12/24/20 0805 01/07/21 0817 01/22/21 0815  NA 140 132* 135   < > 138 138 136  K 3.9 4.0 4.0   < > 3.2* 3.5 3.1*  CL 105 93* 95*   < > 99 101 97*  CO2 _0 < > _1 GLUCOSE 106* 592* 449*   < > 276* 302* 332*  BUN 13 26* 22*   < > _2 CREATININE 0.90 1.14 1.07   < > 1.07 0.97 0.96  CALCIUM 9.1 9.8 9.6   < > 9.2 9.4 9.3  GFRNONAA >60 >60 >60   < > >60 >60 >60  GFRAA >60 >60 >60  --   --   --   --   PROT  --   --   --    < > 7.3 7.9 7.5  ALBUMIN  --   --   --    < >  4.0 4.4 4.2  AST  --   --   --    < > 29 22 32  ALT  --   --   --    < > _0 ALKPHOS  --   --   --    < > 123 150* 173*  BILITOT  --   --   --    < > 0.4 0.6 0.7   < > = values in this interval not displayed.    RADIOGRAPHIC STUDIES: I have personally reviewed the radiological images as listed and agreed with the findings in the report. No results found.  ASSESSMENT & PLAN:   Pancreatic cancer  metastasized to liver [facility]) #Pancreatic acinar cell cancer with metastasis to liver. STAGE IV.  Currently on palliative chemotherapy with FOLFIRINOX.  DiscussFoundation one- BRCA-1.   #Proceed with cycle #3 of FOLFIRINOX today. Labs today reviewed;  acceptable for treatment today. Discontinue Elwin Sleight below]; discussed that we will plan follow-up imaging after 6 cycles.  # Pain control: Secondary malignancy stable continue Percocet 7.5 20 tabs a da; MS Contin twice daily refilled.   #Diabetes-continue current medication-FBS/ PPF ~300; recommend monitoring blood sugars closely on chemotherapy if elevated then 300 recommend treating her PCP.   # Oral mucositis- recommend salt water/biotene rinses.   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  # weight loss/ poor taste recommend evaluation- awaiting evaluation with Joli today.   # short term disability-[June 3rd] pt will get back to me   # DISPOSITION:  # chemo today; pump off d-3 # follow up ion 6/13-MD; labs- cbc/cmp;FOLFORINOX;possible KCL 20 meq; day-3 pump off; udenyca- Dr.B  All questions were answered. The patient knows to call the clinic with any problems, questions or concerns.    [name], MD 01/22/2021 1:09 PM

## 2021-01-23 ENCOUNTER — Inpatient Hospital Stay: Payer: BLUE CROSS/BLUE SHIELD

## 2021-01-24 ENCOUNTER — Inpatient Hospital Stay: Payer: BLUE CROSS/BLUE SHIELD | Attending: Internal Medicine

## 2021-01-24 VITALS — BP 112/75 | HR 88 | Resp 20

## 2021-01-24 DIAGNOSIS — K1230 Oral mucositis (ulcerative), unspecified: Secondary | ICD-10-CM | POA: Insufficient documentation

## 2021-01-24 DIAGNOSIS — C787 Secondary malignant neoplasm of liver and intrahepatic bile duct: Secondary | ICD-10-CM | POA: Insufficient documentation

## 2021-01-24 DIAGNOSIS — C259 Malignant neoplasm of pancreas, unspecified: Secondary | ICD-10-CM

## 2021-01-24 DIAGNOSIS — Z452 Encounter for adjustment and management of vascular access device: Secondary | ICD-10-CM | POA: Diagnosis not present

## 2021-01-24 DIAGNOSIS — E119 Type 2 diabetes mellitus without complications: Secondary | ICD-10-CM | POA: Diagnosis not present

## 2021-01-24 DIAGNOSIS — I10 Essential (primary) hypertension: Secondary | ICD-10-CM | POA: Insufficient documentation

## 2021-01-24 DIAGNOSIS — C252 Malignant neoplasm of tail of pancreas: Secondary | ICD-10-CM | POA: Diagnosis not present

## 2021-01-24 DIAGNOSIS — Z5189 Encounter for other specified aftercare: Secondary | ICD-10-CM | POA: Insufficient documentation

## 2021-01-24 DIAGNOSIS — E876 Hypokalemia: Secondary | ICD-10-CM | POA: Insufficient documentation

## 2021-01-24 MED ORDER — SODIUM CHLORIDE 0.9% FLUSH
10.0000 mL | INTRAVENOUS | Status: DC | PRN
  Administered 2021-01-24: 10 mL
  Filled 2021-01-24: qty 10

## 2021-01-24 MED ORDER — HEPARIN SOD (PORK) LOCK FLUSH 100 UNIT/ML IV SOLN
500.0000 [IU] | Freq: Once | INTRAVENOUS | Status: AC | PRN
  Administered 2021-01-24: 500 [IU]
  Filled 2021-01-24: qty 5

## 2021-01-24 MED ORDER — PEGFILGRASTIM-CBQV 6 MG/0.6ML ~~LOC~~ SOSY
6.0000 mg | PREFILLED_SYRINGE | Freq: Once | SUBCUTANEOUS | Status: AC
  Administered 2021-01-24: 6 mg via SUBCUTANEOUS
  Filled 2021-01-24: qty 0.6

## 2021-01-24 MED ORDER — HEPARIN SOD (PORK) LOCK FLUSH 100 UNIT/ML IV SOLN
INTRAVENOUS | Status: AC
  Filled 2021-01-24: qty 5

## 2021-02-04 ENCOUNTER — Inpatient Hospital Stay: Payer: BLUE CROSS/BLUE SHIELD

## 2021-02-04 ENCOUNTER — Inpatient Hospital Stay (HOSPITAL_BASED_OUTPATIENT_CLINIC_OR_DEPARTMENT_OTHER): Payer: BLUE CROSS/BLUE SHIELD | Admitting: Internal Medicine

## 2021-02-04 ENCOUNTER — Other Ambulatory Visit: Payer: Self-pay

## 2021-02-04 ENCOUNTER — Encounter: Payer: Self-pay | Admitting: Internal Medicine

## 2021-02-04 DIAGNOSIS — C787 Secondary malignant neoplasm of liver and intrahepatic bile duct: Secondary | ICD-10-CM

## 2021-02-04 DIAGNOSIS — C259 Malignant neoplasm of pancreas, unspecified: Secondary | ICD-10-CM

## 2021-02-04 DIAGNOSIS — C252 Malignant neoplasm of tail of pancreas: Secondary | ICD-10-CM | POA: Diagnosis not present

## 2021-02-04 LAB — CBC WITH DIFFERENTIAL/PLATELET
Abs Immature Granulocytes: 1.17 10*3/uL — ABNORMAL HIGH (ref 0.00–0.07)
Basophils Absolute: 0.2 10*3/uL — ABNORMAL HIGH (ref 0.0–0.1)
Basophils Relative: 1 %
Eosinophils Absolute: 0.3 10*3/uL (ref 0.0–0.5)
Eosinophils Relative: 1 %
HCT: 37.1 % — ABNORMAL LOW (ref 39.0–52.0)
Hemoglobin: 12.3 g/dL — ABNORMAL LOW (ref 13.0–17.0)
Immature Granulocytes: 6 %
Lymphocytes Relative: 10 %
Lymphs Abs: 1.9 10*3/uL (ref 0.7–4.0)
MCH: 27.6 pg (ref 26.0–34.0)
MCHC: 33.2 g/dL (ref 30.0–36.0)
MCV: 83.2 fL (ref 80.0–100.0)
Monocytes Absolute: 1.5 10*3/uL — ABNORMAL HIGH (ref 0.1–1.0)
Monocytes Relative: 8 %
Neutro Abs: 14.3 10*3/uL — ABNORMAL HIGH (ref 1.7–7.7)
Neutrophils Relative %: 74 %
Platelets: 125 10*3/uL — ABNORMAL LOW (ref 150–400)
RBC: 4.46 MIL/uL (ref 4.22–5.81)
RDW: 16.4 % — ABNORMAL HIGH (ref 11.5–15.5)
WBC: 19.3 10*3/uL — ABNORMAL HIGH (ref 4.0–10.5)
nRBC: 0.1 % (ref 0.0–0.2)

## 2021-02-04 LAB — COMPREHENSIVE METABOLIC PANEL
ALT: 46 U/L — ABNORMAL HIGH (ref 0–44)
AST: 41 U/L (ref 15–41)
Albumin: 4 g/dL (ref 3.5–5.0)
Alkaline Phosphatase: 191 U/L — ABNORMAL HIGH (ref 38–126)
Anion gap: 11 (ref 5–15)
BUN: 14 mg/dL (ref 6–20)
CO2: 28 mmol/L (ref 22–32)
Calcium: 9.4 mg/dL (ref 8.9–10.3)
Chloride: 94 mmol/L — ABNORMAL LOW (ref 98–111)
Creatinine, Ser: 1.07 mg/dL (ref 0.61–1.24)
GFR, Estimated: >60 mL/min (ref >60)
Glucose, Bld: 493 mg/dL — ABNORMAL HIGH (ref 70–99)
Potassium: 3.5 mmol/L (ref 3.5–5.1)
Sodium: 133 mmol/L — ABNORMAL LOW (ref 135–145)
Total Bilirubin: 0.5 mg/dL (ref 0.3–1.2)
Total Protein: 7.5 g/dL (ref 6.5–8.1)

## 2021-02-04 MED ORDER — MAGIC MOUTHWASH W/LIDOCAINE: 5.0000 mL | Freq: Four times a day (QID) | ORAL | 3 refills | Status: DC

## 2021-02-04 MED ORDER — HEPARIN SOD (PORK) LOCK FLUSH 100 UNIT/ML IV SOLN
500.0000 [IU] | Freq: Once | INTRAVENOUS | Status: AC
  Administered 2021-02-04: 500 [IU]
  Filled 2021-02-04: qty 5

## 2021-02-04 NOTE — Progress Notes (Signed)
[name]  Patient Care Team: [facility] as PCP - General Dan Jacks, RN as Oncology Nurse Navigator  CHIEF COMPLAINTS/PURPOSE OF CONSULTATION: Pancreatic cancer   Oncology History Overview Note  # April 2022- [name],  BIOPSY:  - METASTATIC PANCREATIC ACINAR CELL CARCINOMA.; STAGE IV; CT scan shows-approximately 8cm centimeter pancreatic mass; adrenal lesion; also approximately 6 cm liver lesion.   ; LIPASE NORMAL.   # April 18ht, 2022- FOLFIRINOX; Dan [name].   There is sufficient material for ancillary molecular studies if needed  (A1, A3, A4, A2)      # NGS/MOLECULAR TESTS: BRCA-1*    # PALLIATIVE CARE EVALUATION:  # PAIN MANAGEMENT:    DIAGNOSIS:   STAGE:         ;  GOALS:  CURRENT/MOST RECENT THERAPY :     Pancreatic cancer metastasized to liver ([name])  11/23/2020 Initial Diagnosis   Pancreatic cancer metastasized to liver ([name])    11/28/2020 Cancer Staging   Staging form: Exocrine Pancreas, AJCC 8th Edition - Clinical: Stage IV (cM1) - Signed by [name], MD on 11/28/2020  Histopathologic type: Acinar cell carcinoma    12/10/2020 -  Chemotherapy    Patient is on Treatment Plan: PANCREAS MODIFIED FOLFIRINOX Q14D X 4 CYCLES          HISTORY OF PRESENTING ILLNESS:  Dan [name] 55 y.o.  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  The patient denies any significant nausea vomiting.  Denies any diarrhea.  Positive weight loss.  Complains of poor taste.  Inability to eat because of lack of taste.  Sores in the mouth.    Mild abdominal pain improved.  Patient taking Percocet 2 pills a day; along with MS Contin.  Complains of significant fatigue.  Review of Systems  Constitutional:  Positive for malaise/fatigue and weight loss. Negative for chills, diaphoresis and fever.  HENT:  Positive for sore throat. Negative for  nosebleeds.   Eyes:  Negative for double vision.  Respiratory:  Negative for cough, hemoptysis, sputum production, shortness of breath and wheezing.   Cardiovascular:  Negative for chest pain, palpitations, orthopnea and leg swelling.  Gastrointestinal:  Positive for abdominal pain and nausea. Negative for blood in stool, constipation, diarrhea, heartburn, melena and vomiting.  Genitourinary:  Negative for dysuria, frequency and urgency.  Musculoskeletal:  Negative for back pain and joint pain.  Skin: Negative.  Negative for itching and rash.  Neurological:  Negative for dizziness, tingling, focal weakness, weakness and headaches.  Endo/Heme/Allergies:  Does not bruise/bleed easily.  Psychiatric/Behavioral:  Negative for depression. The patient is not nervous/anxious and does not have insomnia.     MEDICAL HISTORY:  Past Medical History:  Diagnosis Date   Acid reflux    Asthma    Diabetes mellitus without complication ([name])    History of kidney stones    Hypertension    Kidney stones     SURGICAL HISTORY: Past Surgical History:  Procedure Laterality Date   COLONOSCOPY WITH PROPOFOL N/A 12/02/2019   Procedure: COLONOSCOPY WITH PROPOFOL;  Surgeon: [name], MD;  Location[facility] ENDOSCOPY;  Service: Gastroenterology;  Laterality: N/A;   FRACTURE SURGERY Left at age 28   pelvic fracture   HEMORRHOID SURGERY N/A 01/31/2020   Procedure: HEMORRHOIDECTOMY;  Surgeon: Jules Husbands, MD;  Location: [facility] ORS;  Service: General;  Laterality: N/A;   IR IMAGING GUIDED PORT INSERTION  11/30/2020  pelvis fractur     ROTATOR CUFF REPAIR      SOCIAL HISTORY: Social History   Socioeconomic History   Marital status: Married    Spouse name: Not on file   Number of children: Not on file   Years of education: Not on file   Highest education level: Not on file  Occupational History   Not on file  Tobacco Use   Smoking status: Former    Packs/day: 0.50    Years: 25.00    Pack years: 12.50     Types: Cigarettes   Smokeless tobacco: Never  Vaping Use   Vaping Use: Never used  Substance and Sexual Activity   Alcohol use: Yes    Alcohol/week: 6.0 standard drinks    Types: 6 Cans of beer per week    Comment: weekends only   Drug use: No   Sexual activity: Yes  Other Topics Concern   Not on file  Social History Narrative   Works at [employer]. In [place]. Quit smoking 2021 June. Social. Alcohol.    Social Determinants of Health   Financial Resource Strain: Not on file  Food Insecurity: Not on file  Transportation Needs: Not on file  Physical Activity: Not on file  Stress: Not on file  Social Connections: Not on file  Intimate Partner Violence: Not on file    FAMILY HISTORY: Family History  Problem Relation Age of Onset   Cancer Sister        2 sisters 93 and 30- both died of breast cancer   Diabetes Brother    Hypertension Brother    Heart attack Father    Asthma Son     ALLERGIES:  is allergic to [place] [aprepitant], cherry, ibuprofen, other, and tramadol.  MEDICATIONS:  Current Outpatient Medications  Medication Sig Dispense Refill   amLODipine (NORVASC) 10 MG tablet TAKE 1 TABLET(10 MG) BY MOUTH DAILY (Patient taking differently: Take 10 mg by mouth daily.) 90 tablet 1   chlorthalidone (HYGROTON) 25 MG tablet Take 25 mg by mouth every morning.     FLOVENT HFA 110 MCG/ACT inhaler Inhale 1 puff into the lungs 2 (two) times daily.     JARDIANCE 10 MG TABS tablet Take 10 mg by mouth daily.     lidocaine-prilocaine (EMLA) cream Apply 1 application topically as needed (prior to port a cath needle access on the days of chemotherapy). 30 g 3   losartan (COZAAR) 100 MG tablet TAKE 1 TABLET(100 MG) BY MOUTH DAILY (Patient taking differently: Take 100 mg by mouth daily.) 90 tablet 1   magic mouthwash w/lidocaine SOLN Take 5 mLs by mouth 4 (four) times daily. 80 ml viscous lidocaine 2%, 80 ml Mylanta, 80 ml Diphenhydramine 12.5 mg/5 ml Elixir, 80 ml Nystatin 100,000 Unit  suspension, 80 ml Prednisolone 15 mg/26ml, 80 ml Distilled Water. Sig: Swish/Swallow 5-10 ml four times a day as needed. Dispense 480 ml. 3RFs 480 mL 3   morphine (MS CONTIN) 30 MG 12 hr tablet Take 1 tablet (30 mg total) by mouth every 12 (twelve) hours. 60 tablet 0   omeprazole (PRILOSEC) 40 MG capsule Take 1 capsule (40 mg total) by mouth daily. 30 capsule 3   ondansetron (ZOFRAN) 8 MG tablet Take 1 tablet (8 mg total) by mouth every 8 (eight) hours as needed for nausea, vomiting or refractory nausea / vomiting. 20 tablet 3   oxyCODONE-acetaminophen (PERCOCET) 7.5-325 MG tablet Take 1 tablet by mouth every 8 (eight) hours as needed for severe pain.  60 tablet 0   potassium chloride SA (KLOR-CON) 20 MEQ tablet 1 pill twice a day 60 tablet 3   prochlorperazine (COMPAZINE) 10 MG tablet Take 1 tablet (10 mg total) by mouth every 6 (six) hours as needed for nausea or vomiting. 30 tablet 3   No current facility-administered medications for this visit.      Marland Kitchen  PHYSICAL EXAMINATION: ECOG PERFORMANCE STATUS: 1 - Symptomatic but completely ambulatory  Vitals:   02/04/21 0832  BP: 128/88  Pulse: 94  Resp: 20  Temp: 98.6 F (37 C)   Filed Weights   02/04/21 0832  Weight: 191 lb (86.6 kg)    Physical Exam Constitutional:      Comments: Accompanied by wife.  Ambulating independently.  HENT:     Head: Normocephalic and atraumatic.     Mouth/Throat:     Pharynx: No oropharyngeal exudate.  Eyes:     Pupils: Pupils are equal, round, and reactive to light.  Cardiovascular:     Rate and Rhythm: Normal rate and regular rhythm.  Pulmonary:     Effort: Pulmonary effort is normal. No respiratory distress.     Breath sounds: Normal breath sounds. No wheezing.  Abdominal:     General: Bowel sounds are normal. There is no distension.     Palpations: Abdomen is soft. There is no mass.     Tenderness: There is no abdominal tenderness. There is no guarding or rebound.  Musculoskeletal:         General: No tenderness. Normal range of motion.     Cervical back: Normal range of motion and neck supple.  Skin:    General: Skin is warm.  Neurological:     Mental Status: He is alert and oriented to person, place, and time.  Psychiatric:        Mood and Affect: Affect normal.     LABORATORY DATA:  I have reviewed the data as listed Lab Results  Component Value Date   WBC 19.3 (H) 02/04/2021   HGB 12.3 (L) 02/04/2021   HCT 37.1 (L) 02/04/2021   MCV 83.2 02/04/2021   PLT 125 (L) 02/04/2021   Recent Labs    05/08/20 1955 05/09/20 0921 11/19/20 0745 01/07/21 0817 01/22/21 0815 02/04/21 0757  NA 132* 135   < > 138 136 133*  K 4.0 4.0   < > 3.5 3.1* 3.5  CL 93* 95*   < > 101 97* 94*  CO2 28 29   < > _0 GLUCOSE 592* 449*   < > 302* 332* 493*  BUN 26* 22*   < > _1 CREATININE 1.14 1.07   < > 0.97 0.96 1.07  CALCIUM 9.8 9.6   < > 9.4 9.3 9.4  GFRNONAA >60 >60   < > >60 >60 >60  GFRAA >60 >60  --   --   --   --   PROT  --   --    < > 7.9 7.5 7.5  ALBUMIN  --   --    < > 4.4 4.2 4.0  AST  --   --    < > 22 32 41  ALT  --   --    < > 28 31 46*  ALKPHOS  --   --    < > 150* 173* 191*  BILITOT  --   --    < > 0.6 0.7 0.5   < > = values in this  interval not displayed.    RADIOGRAPHIC STUDIES: I have personally reviewed the radiological images as listed and agreed with the findings in the report. No results found.  ASSESSMENT & PLAN:   Pancreatic cancer metastasized to liver [facility]) #Pancreatic acinar cell cancer with metastasis to liver. STAGE IV.  Currently on palliative chemotherapy with FOLFIRINOX.    # HOLD chemo cycle #5 of FOLFIRINOX today. Labs today reviewed;  UNacceptable for treatment today- elevated BG [439-see below].  Given general patient's poor tolerance to therapy/reluctance-recommend evaluation with a CT scan with contrast after cycle #4.  Patient interested in "pill" [BRCA inhibitor/Lynparza].    # Pain control: Secondary malignancy- STABLE;  continue Percocet 7.5 20 tabs a day; MS Contin twice daily refilled.   #Fatigue-grade 2-3-multifactorial/predominantly chemotherapy side effects [poorly controlled blood glucose-see below].  Hold chemo today.  # Diabetes-continue current medication-FBS/ PPF ~439- HOLD chemo;  recommend monitoring blood sugars closely.  Recommend reaching out to PCP regarding reevaluation for diabetes/blood glucose control.  # Oral mucositis-grade 2-continue more frequent salt water/biotene rinses add Magic mouthwash hold chemo  # Mild hypokalemia-K-3.1- recommend Kdur 20 mq BID.  # weight loss/dysgeusia-secondary chemotherapy s/p evaluation with Joli last week.    * magic mouthwash # DISPOSITION:  # HOLD chemo today; pump off d-3; de-acces # follow up in 2 weeks- MD; labs- cbc/cmp;FOLFORINOX;possible KCL 20 meq; day-3 pump off; udenyca; CT A/P prior- Dr.B  All questions were answered. The patient knows to call the clinic with any problems, questions or concerns.    [name], MD 02/05/2021 9:20 AM

## 2021-02-04 NOTE — Assessment & Plan Note (Addendum)
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  Currently on palliative chemotherapy with FOLFIRINOX.    # HOLD chemo cycle #5 of FOLFIRINOX today. Labs today reviewed;  UNacceptable for treatment today- elevated BG [439-see below].  Given general patient's poor tolerance to therapy/reluctance-recommend evaluation with a CT scan with contrast after cycle #4.  Patient interested in "pill" [BRCA inhibitor/Lynparza].    # Pain control: Secondary malignancy- STABLE; continue Percocet 7.5 20 tabs a day; MS Contin twice daily refilled.   #Fatigue-grade 2-3-multifactorial/predominantly chemotherapy side effects [poorly controlled blood glucose-see below].  Hold chemo today.  # Diabetes-continue current medication-FBS/ PPF ~439- HOLD chemo;  recommend monitoring blood sugars closely.  Recommend reaching out to PCP regarding reevaluation for diabetes/blood glucose control.  # Oral mucositis-grade 2-continue more frequent salt water/biotene rinses add Magic mouthwash hold chemo  # Mild hypokalemia-K-3.1- recommend Kdur 20 mq BID.  # weight loss/dysgeusia-secondary chemotherapy s/p evaluation with [facility] last week.    * magic mouthwash # DISPOSITION:  # HOLD chemo today; pump off d-3; de-acces # follow up in 2 weeks- MD; labs- cbc/cmp;FOLFORINOX;possible KCL 20 meq; day-3 pump off; udenyca; CT A/P prior- Dr.B

## 2021-02-04 NOTE — Progress Notes (Signed)
Patient would like to discuss chemotherapy pill. He continues to have oral mucositis. He would like to discuss timing on next ct scan. He is not eating well, but reports that he is drinking plenty of fluids. He intakes only one boost per day.

## 2021-02-04 NOTE — Progress Notes (Signed)
Sent magic mouthwash prescription to [facility]. [address] Per v/o Dr. Rogue Bussing

## 2021-02-05 ENCOUNTER — Encounter: Payer: Self-pay | Admitting: Internal Medicine

## 2021-02-06 ENCOUNTER — Inpatient Hospital Stay: Payer: BLUE CROSS/BLUE SHIELD

## 2021-02-13 ENCOUNTER — Other Ambulatory Visit: Payer: Self-pay

## 2021-02-13 ENCOUNTER — Ambulatory Visit
Admission: RE | Admit: 2021-02-13 | Discharge: 2021-02-13 | Disposition: A | Discharge status: Home / Self Care | Payer: BLUE CROSS/BLUE SHIELD | Source: Ambulatory Visit | Attending: Internal Medicine | Admitting: Internal Medicine

## 2021-02-13 DIAGNOSIS — C259 Malignant neoplasm of pancreas, unspecified: Secondary | ICD-10-CM | POA: Diagnosis not present

## 2021-02-13 DIAGNOSIS — C787 Secondary malignant neoplasm of liver and intrahepatic bile duct: Secondary | ICD-10-CM | POA: Insufficient documentation

## 2021-02-13 MED ORDER — IOHEXOL 300 MG/ML  SOLN
100.0000 mL | Freq: Once | INTRAMUSCULAR | Status: AC | PRN
  Administered 2021-02-13: 100 mL via INTRAVENOUS

## 2021-02-19 ENCOUNTER — Telehealth: Payer: Self-pay | Admitting: Pharmacist

## 2021-02-19 ENCOUNTER — Other Ambulatory Visit (HOSPITAL_COMMUNITY): Payer: Self-pay

## 2021-02-19 ENCOUNTER — Encounter: Payer: Self-pay | Admitting: Internal Medicine

## 2021-02-19 ENCOUNTER — Inpatient Hospital Stay: Payer: BLUE CROSS/BLUE SHIELD

## 2021-02-19 ENCOUNTER — Inpatient Hospital Stay (HOSPITAL_BASED_OUTPATIENT_CLINIC_OR_DEPARTMENT_OTHER): Payer: BLUE CROSS/BLUE SHIELD | Admitting: Internal Medicine

## 2021-02-19 DIAGNOSIS — Z95828 Presence of other vascular implants and grafts: Secondary | ICD-10-CM

## 2021-02-19 DIAGNOSIS — C259 Malignant neoplasm of pancreas, unspecified: Secondary | ICD-10-CM

## 2021-02-19 DIAGNOSIS — C787 Secondary malignant neoplasm of liver and intrahepatic bile duct: Secondary | ICD-10-CM

## 2021-02-19 DIAGNOSIS — C252 Malignant neoplasm of tail of pancreas: Secondary | ICD-10-CM | POA: Diagnosis not present

## 2021-02-19 LAB — CBC WITH DIFFERENTIAL/PLATELET
Abs Immature Granulocytes: 0.04 10*3/uL (ref 0.00–0.07)
Basophils Absolute: 0.1 10*3/uL (ref 0.0–0.1)
Basophils Relative: 1 %
Eosinophils Absolute: 0.4 10*3/uL (ref 0.0–0.5)
Eosinophils Relative: 7 %
HCT: 36.8 % — ABNORMAL LOW (ref 39.0–52.0)
Hemoglobin: 11.7 g/dL — ABNORMAL LOW (ref 13.0–17.0)
Immature Granulocytes: 1 %
Lymphocytes Relative: 25 %
Lymphs Abs: 1.3 10*3/uL (ref 0.7–4.0)
MCH: 27 pg (ref 26.0–34.0)
MCHC: 31.8 g/dL (ref 30.0–36.0)
MCV: 85 fL (ref 80.0–100.0)
Monocytes Absolute: 0.7 10*3/uL (ref 0.1–1.0)
Monocytes Relative: 13 %
Neutro Abs: 2.8 10*3/uL (ref 1.7–7.7)
Neutrophils Relative %: 53 %
Platelets: 223 10*3/uL (ref 150–400)
RBC: 4.33 MIL/uL (ref 4.22–5.81)
RDW: 16.3 % — ABNORMAL HIGH (ref 11.5–15.5)
WBC: 5.3 10*3/uL (ref 4.0–10.5)
nRBC: 0 % (ref 0.0–0.2)

## 2021-02-19 LAB — COMPREHENSIVE METABOLIC PANEL
ALT: 25 U/L (ref 0–44)
AST: 25 U/L (ref 15–41)
Albumin: 4 g/dL (ref 3.5–5.0)
Alkaline Phosphatase: 89 U/L (ref 38–126)
Anion gap: 9 (ref 5–15)
BUN: 14 mg/dL (ref 6–20)
CO2: 26 mmol/L (ref 22–32)
Calcium: 9 mg/dL (ref 8.9–10.3)
Chloride: 100 mmol/L (ref 98–111)
Creatinine, Ser: 0.75 mg/dL (ref 0.61–1.24)
GFR, Estimated: >60 mL/min (ref >60)
Glucose, Bld: 195 mg/dL — ABNORMAL HIGH (ref 70–99)
Potassium: 3.1 mmol/L — ABNORMAL LOW (ref 3.5–5.1)
Sodium: 135 mmol/L (ref 135–145)
Total Bilirubin: 0.9 mg/dL (ref 0.3–1.2)
Total Protein: 7 g/dL (ref 6.5–8.1)

## 2021-02-19 MED ORDER — MORPHINE SULFATE ER 30 MG PO TBCR: 30.0000 mg | EXTENDED_RELEASE_TABLET | Freq: Two times a day (BID) | ORAL | 0 refills | Status: DC

## 2021-02-19 MED ORDER — METHYLPREDNISOLONE 4 MG PO TBPK: ORAL_TABLET | ORAL | 1 refills | Status: DC

## 2021-02-19 MED ORDER — HEPARIN SOD (PORK) LOCK FLUSH 100 UNIT/ML IV SOLN
INTRAVENOUS | Status: AC
  Filled 2021-02-19: qty 5

## 2021-02-19 MED ORDER — SODIUM CHLORIDE 0.9% FLUSH
10.0000 mL | Freq: Once | INTRAVENOUS | Status: AC
  Administered 2021-02-19: 10 mL via INTRAVENOUS
  Filled 2021-02-19: qty 10

## 2021-02-19 MED ORDER — HEPARIN SOD (PORK) LOCK FLUSH 100 UNIT/ML IV SOLN
500.0000 [IU] | Freq: Once | INTRAVENOUS | Status: AC
  Administered 2021-02-19: 500 [IU] via INTRAVENOUS
  Filled 2021-02-19: qty 5

## 2021-02-19 MED ORDER — OXYCODONE-ACETAMINOPHEN 7.5-325 MG PO TABS: 1.0000 | ORAL_TABLET | Freq: Three times a day (TID) | ORAL | 0 refills | Status: DC | PRN

## 2021-02-19 NOTE — Assessment & Plan Note (Addendum)
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  Currently on palliative chemotherapy with FOLFIRINOX s/p 4 cyc;es-June 23rd- 2022-INCREASE in size of large pancreatic tail mass, and adjacent peripancreatic lymphadenopathy. Increased size of large complex cystic and solid mass in right hepatic lobe, consistent with hepatic metastasis. 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 Adrenal metastasis cannot be excluded.  #I reviewed the rather disappointing results of the imaging post 4 cycles of FOLFIRINOX.  Discussed that this unfortunately is a bad prognostic sign.  Again given patient's poor tolerance to FOLFIRINOX/patient preference-we will discontinue FOLFIRINOX at this time.  Patient interested in "pill"-I discussed the role of PARP inhibitor-Lynparza. Discussed the option of Gem-Cis-which has improved response rates up to 70%.  Recommend 16 weeks of therapy-with interim scan.  And if response noted could potentially use PARP inhibitor for maintenance.   # HOLD chemo cycle #5 of FOLFIRINOX today. Labs today reviewed;  UNacceptable for treatment today- elevated BG [439-see below].  Given general patient's poor tolerance to therapy/reluctance-recommend evaluation with a CT scan with contrast after cycle #4.  Patient interested in "pill" [BRCA inhibitor/Lynparza].    # Pain control: Secondary malignancy- STABLE; continue Percocet 7.5 20 tabs a day; MS Contin twice daily refilled.   #Fatigue-grade 2-3-multifactorial/predominantly chemotherapy side effects [poorly controlled blood glucose-see below].  Hold chemo today.  # Diabetes-continue current medication-FBS/ PPF ~439- HOLD chemo;  recommend monitoring blood sugars closely.  Recommend reaching out to PCP regarding reevaluation for diabetes/blood glucose control.  # Oral mucositis-grade 2-continue more frequent salt water/biotene rinses add Magic mouthwash hold chemo  # Mild  hypokalemia-K-3.1- recommend Kdur 20 mq BID.  # weight loss/dysgeusia-secondary chemotherapy s/p evaluation with [facility] last week.   # BRCA- pt has 3 children [Boys-28 & 29, daugter 40]; recommend genetic counselling  # Rash-likely allergic- ? Source- recommend medrol dose pack.   # DISPOSITION:  # NO chemo today; pump off d-3; de-access # genetic counselling re: BRCA mutation #  follow up in 3 weeks [wed/thursday]- MD; labs- cbc/cmp; Dr.B  Addendum: I had a long discussion with the patient/ wife regarding chemotherapeutic option better in terms of response rates compared to [place]  Given detailed data.  However wife adamantly states that he would not go to chemotherapy; and that they would prefer the pill.

## 2021-02-19 NOTE — Patient Instructions (Signed)
[facility]  Discharge Instructions: Thank you for choosing [facility] to provide your oncology and hematology care.  If you have a lab appointment with the [facility], please go directly to the [facility] and check in at the registration area.  Wear comfortable clothing and clothing appropriate for easy access to any Portacath or PICC line.   We strive to give you quality time with your provider. You may need to reschedule your appointment if you arrive late (15 or more minutes).  Arriving late affects you and other patients whose appointments are after yours.  Also, if you miss three or more appointments without notifying the office, you may be dismissed from the clinic at the provider's discretion.      For prescription refill requests, have your pharmacy contact our office and allow 72 hours for refills to be completed.    Today you received the following chemotherapy and/or immunotherapy agents port flush done        To help prevent nausea and vomiting after your treatment, we encourage you to take your nausea medication as directed.  BELOW ARE SYMPTOMS THAT SHOULD BE REPORTED IMMEDIATELY: *FEVER GREATER THAN 100.4 F (38 C) OR HIGHER *CHILLS OR SWEATING *NAUSEA AND VOMITING THAT IS NOT CONTROLLED WITH YOUR NAUSEA MEDICATION *UNUSUAL SHORTNESS OF BREATH *UNUSUAL BRUISING OR BLEEDING *URINARY PROBLEMS (pain or burning when urinating, or frequent urination) *BOWEL PROBLEMS (unusual diarrhea, constipation, pain near the anus) TENDERNESS IN MOUTH AND THROAT WITH OR WITHOUT PRESENCE OF ULCERS (sore throat, sores in mouth, or a toothache) UNUSUAL RASH, SWELLING OR PAIN  UNUSUAL VAGINAL DISCHARGE OR ITCHING   Items with * indicate a potential emergency and should be followed up as soon as possible or go to the Emergency Department if any problems should occur.  Please show the CHEMOTHERAPY ALERT CARD or IMMUNOTHERAPY ALERT CARD at  check-in to the Emergency Department and triage nurse.  Should you have questions after your visit or need to cancel or reschedule your appointment, please contact [facility]  [phone] and follow the prompts.  Office hours are 8:00 a.m. to 4:30 p.m. Monday - Friday. Please note that voicemails left after 4:00 p.m. may not be returned until the following business day.  We are closed weekends and major holidays. You have access to a nurse at all times for urgent questions. Please call the main number to the clinic [phone] and follow the prompts.  For any non-urgent questions, you may also contact your provider using MyChart. We now offer e-Visits for anyone 60 and older to request care online for non-urgent symptoms. For details visit [email or web address].   Also download the MyChart app! Go to the app store, search "MyChart", open the app, select [facility], and log in with your MyChart username and password.  Due to Covid, a mask is required upon entering the hospital/clinic. If you do not have a mask, one will be given to you upon arrival. For doctor visits, patients may have 1 support person aged 68 or older with them. For treatment visits, patients cannot have anyone with them due to current Covid guidelines and our immunocompromised population.  Implanted [facility] Guide An implanted port is a device that is placed under the skin. It is usually placed in the chest. The device can be used to give IV medicine, to take blood, or for dialysis. You may have an implanted port if: You need IV medicine that would be  irritating to the small veins in your hands or arms. You need IV medicines, such as antibiotics, for a long period of time. You need IV nutrition for a long period of time. You need dialysis. When you have a port, your health care provider can choose to use the port instead of veins in your arms for these procedures. You may have fewer  limitations when using a port than you would if you used other types of long-term IVs, and you will likely be able to return to normal activities afteryour incision heals. An implanted port has two main parts: Reservoir. The reservoir is the part where a needle is inserted to give medicines or draw blood. The reservoir is round. After it is placed, it appears as a small, raised area under your skin. Catheter. The catheter is a thin, flexible tube that connects the reservoir to a vein. Medicine that is inserted into the reservoir goes into the catheter and then into the vein. How is my port accessed? To access your port: A numbing cream may be placed on the skin over the port site. Your health care provider will put on a mask and sterile gloves. The skin over your port will be cleaned carefully with a germ-killing soap and allowed to dry. Your health care provider will gently pinch the port and insert a needle into it. Your health care provider will check for a blood return to make sure the port is in the vein and is not clogged. If your port needs to remain accessed to get medicine continuously (constant infusion), your health care provider will place a clear bandage (dressing) over the needle site. The dressing and needle will need to be changed every week, or as told by your health care provider. What is flushing? Flushing helps keep the port from getting clogged. Follow instructions from your health care provider about how and when to flush the port. Ports are usually flushed with saline solution or a medicine called heparin. The need for flushing will depend on how the port is used: If the port is only used from time to time to give medicines or draw blood, the port may need to be flushed: Before and after medicines have been given. Before and after blood has been drawn. As part of routine maintenance. Flushing may be recommended every 4-6 weeks. If a constant infusion is running, the port may  not need to be flushed. Throw away any syringes in a disposal container that is meant for sharp items (sharps container). You can buy a sharps container from a pharmacy, or you can make one by using an empty hard plastic bottle with a cover. How long will my port stay implanted? The port can stay in for as long as your health care provider thinks it is needed. When it is time for the port to come out, a surgery will be done to remove it. The surgery will be similar to the procedure that was done to putthe port in. Follow these instructions at home:  Flush your port as told by your health care provider. If you need an infusion over several days, follow instructions from your health care provider about how to take care of your port site. Make sure you: Wash your hands with soap and water before you change your dressing. If soap and water are not available, use alcohol-based hand sanitizer. Change your dressing as told by your health care provider. Place any used dressings or infusion bags into a plastic  bag. Throw that bag in the trash. Keep the dressing that covers the needle clean and dry. Do not get it wet. Do not use scissors or sharp objects near the tube. Keep the tube clamped, unless it is being used. Check your port site every day for signs of infection. Check for: Redness, swelling, or pain. Fluid or blood. Pus or a bad smell. Protect the skin around the port site. Avoid wearing bra straps that rub or irritate the site. Protect the skin around your port from seat belts. Place a soft pad over your chest if needed. Bathe or shower as told by your health care provider. The site may get wet as long as you are not actively receiving an infusion. Return to your normal activities as told by your health care provider. Ask your health care provider what activities are safe for you. Carry a medical alert card or wear a medical alert bracelet at all times. This will let health care providers know  that you have an implanted port in case of an emergency. Get help right away if: You have redness, swelling, or pain at the port site. You have fluid or blood coming from your port site. You have pus or a bad smell coming from the port site. You have a fever. Summary Implanted ports are usually placed in the chest for long-term IV access. Follow instructions from your health care provider about flushing the port and changing bandages (dressings). Take care of the area around your port by avoiding clothing that puts pressure on the area, and by watching for signs of infection. Protect the skin around your port from seat belts. Place a soft pad over your chest if needed. Get help right away if you have a fever or you have redness, swelling, pain, drainage, or a bad smell at the port site. This information is not intended to replace advice given to you by your health care provider. Make sure you discuss any questions you have with your healthcare provider. Document Revised: 12/26/2019 Document Reviewed: 12/26/2019 Elsevier Patient Education  [place].

## 2021-02-19 NOTE — Progress Notes (Signed)
[facility] CONSULT NOTE  Patient Care Team: [facility] as PCP - General Benita Gutter, RN as Oncology Nurse Navigator  CHIEF COMPLAINTS/PURPOSE OF CONSULTATION: Pancreatic cancer   Oncology History Overview Note  # April 2022- [name],  BIOPSY:  - METASTATIC PANCREATIC ACINAR CELL CARCINOMA.; STAGE IV; CT scan shows-approximately 8cm centimeter pancreatic mass; adrenal lesion; also approximately 6 cm liver lesion.   ; LIPASE NORMAL.   # April 18ht, 2022- FOLFIRINOX; udenyca x4 4 cycles-June 2022-progressive disease noted  # June 2022- Gem/cis vs. Garey Ham.   There is sufficient material for ancillary molecular studies if needed  (A1, A3, A4, A2)      # NGS/MOLECULAR TESTS: BRCA-1*    # PALLIATIVE CARE EVALUATION:  # PAIN MANAGEMENT:    DIAGNOSIS: Pancreatic acinar carcinoma  STAGE:  IV       ;  GOALS:pallaitive  CURRENT/MOST RECENT THERAPY :     Pancreatic cancer metastasized to liver (HCC)  11/23/2020 Initial Diagnosis   Pancreatic cancer metastasized to liver (HCC)    11/28/2020 Cancer Staging   Staging form: Exocrine Pancreas, AJCC 8th Edition - Clinical: Stage IV (cM1) - Signed by Earna Coder, MD on 11/28/2020  Histopathologic type: Acinar cell carcinoma    12/10/2020 -  Chemotherapy    Patient is on Treatment Plan: PANCREAS MODIFIED FOLFIRINOX Q14D X 4 CYCLES          HISTORY OF PRESENTING ILLNESS:  Dan [name] 55 y.o.  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  Patient continues to have abdominal pain and intermittent nausea.  Continues to take MS Contin; oxycodone as needed.  Poor appetite.  Positive weight loss.  Positive fatigue. Review of Systems  Constitutional:  Positive for malaise/fatigue and weight loss. Negative for chills, diaphoresis and fever.  HENT:  Positive for sore throat.  Negative for nosebleeds.   Eyes:  Negative for double vision.  Respiratory:  Negative for cough, hemoptysis, sputum production, shortness of breath and wheezing.   Cardiovascular:  Negative for chest pain, palpitations, orthopnea and leg swelling.  Gastrointestinal:  Positive for abdominal pain and nausea. Negative for blood in stool, constipation, diarrhea, heartburn, melena and vomiting.  Genitourinary:  Negative for dysuria, frequency and urgency.  Musculoskeletal:  Negative for back pain and joint pain.  Skin: Negative.  Negative for itching and rash.  Neurological:  Negative for dizziness, tingling, focal weakness, weakness and headaches.  Endo/Heme/Allergies:  Does not bruise/bleed easily.  Psychiatric/Behavioral:  Negative for depression. The patient is not nervous/anxious and does not have insomnia.     MEDICAL HISTORY:  Past Medical History:  Diagnosis Date   Acid reflux    Asthma    Diabetes mellitus without complication (HCC)    History of kidney stones    Hypertension    Kidney stones     SURGICAL HISTORY: Past Surgical History:  Procedure Laterality Date   COLONOSCOPY WITH PROPOFOL N/A 12/02/2019   Procedure: COLONOSCOPY WITH PROPOFOL;  Surgeon: Wyline Mood, MD;  Location: [facility] ENDOSCOPY;  Service: Gastroenterology;  Laterality: N/A;   FRACTURE SURGERY Left at age 72   pelvic fracture   HEMORRHOID SURGERY N/A 01/31/2020   Procedure: HEMORRHOIDECTOMY;  Surgeon: Leafy Ro, MD;  Location: [facility] ORS;  Service: General;  Laterality: N/A;   IR IMAGING GUIDED PORT INSERTION  11/30/2020   pelvis fractur     ROTATOR CUFF REPAIR  SOCIAL HISTORY: Social History   Socioeconomic History   Marital status: Married    Spouse name: Not on file   Number of children: Not on file   Years of education: Not on file   Highest education level: Not on file  Occupational History   Not on file  Tobacco Use   Smoking status: Former    Packs/day: 0.50    Years: 25.00    Pack  years: 12.50    Types: Cigarettes   Smokeless tobacco: Never  Vaping Use   Vaping Use: Never used  Substance and Sexual Activity   Alcohol use: Yes    Alcohol/week: 6.0 standard drinks    Types: 6 Cans of beer per week    Comment: weekends only   Drug use: No   Sexual activity: Yes  Other Topics Concern   Not on file  Social History Narrative   Works at [employer]. In [place]. Quit smoking 2021 June. Social. Alcohol.    Social Determinants of Health   Financial Resource Strain: Not on file  Food Insecurity: Not on file  Transportation Needs: Not on file  Physical Activity: Not on file  Stress: Not on file  Social Connections: Not on file  Intimate Partner Violence: Not on file    FAMILY HISTORY: Family History  Problem Relation Age of Onset   Pancreatic cancer Mother    Heart attack Father    Cancer Sister        2 sisters 31 and 47- both died of breast cancer   Diabetes Brother    Hypertension Brother    Asthma Son     ALLERGIES:  is allergic to [place] [aprepitant], cherry, ibuprofen, other, and tramadol.  MEDICATIONS:  Current Outpatient Medications  Medication Sig Dispense Refill   amLODipine (NORVASC) 10 MG tablet TAKE 1 TABLET(10 MG) BY MOUTH DAILY (Patient taking differently: Take 10 mg by mouth daily.) 90 tablet 1   chlorthalidone (HYGROTON) 25 MG tablet Take 25 mg by mouth every morning.     FLOVENT HFA 110 MCG/ACT inhaler Inhale 1 puff into the lungs 2 (two) times daily.     JARDIANCE 10 MG TABS tablet Take 10 mg by mouth daily.     lidocaine-prilocaine (EMLA) cream Apply 1 application topically as needed (prior to port a cath needle access on the days of chemotherapy). 30 g 3   losartan (COZAAR) 100 MG tablet TAKE 1 TABLET(100 MG) BY MOUTH DAILY (Patient taking differently: Take 100 mg by mouth daily.) 90 tablet 1   magic mouthwash w/lidocaine SOLN Take 5 mLs by mouth 4 (four) times daily. 80 ml viscous lidocaine 2%, 80 ml Mylanta, 80 ml Diphenhydramine 12.5 mg/5  ml Elixir, 80 ml Nystatin 100,000 Unit suspension, 80 ml Prednisolone 15 mg/46ml, 80 ml Distilled Water. Sig: Swish/Swallow 5-10 ml four times a day as needed. Dispense 480 ml. 3RFs 480 mL 3   methylPREDNISolone (MEDROL DOSEPAK) 4 MG TBPK tablet Use as directed. 21 tablet 1   olaparib (LYNPARZA) 150 MG tablet Take 2 tablets (300 mg total) by mouth 2 (two) times daily. Swallow whole. May take with food to decrease nausea and vomiting. 120 tablet 1   omeprazole (PRILOSEC) 40 MG capsule Take 1 capsule (40 mg total) by mouth daily. 30 capsule 3   ondansetron (ZOFRAN) 8 MG tablet Take 1 tablet (8 mg total) by mouth every 8 (eight) hours as needed for nausea, vomiting or refractory nausea / vomiting. 20 tablet 3   potassium chloride SA (KLOR-CON)  20 MEQ tablet 1 pill twice a day 60 tablet 3   prochlorperazine (COMPAZINE) 10 MG tablet Take 1 tablet (10 mg total) by mouth every 6 (six) hours as needed for nausea or vomiting. 30 tablet 3   morphine (MS CONTIN) 30 MG 12 hr tablet Take 1 tablet (30 mg total) by mouth every 12 (twelve) hours. 60 tablet 0   oxyCODONE-acetaminophen (PERCOCET) 7.5-325 MG tablet Take 1 tablet by mouth every 8 (eight) hours as needed for severe pain. 60 tablet 0   No current facility-administered medications for this visit.      Marland Kitchen  PHYSICAL EXAMINATION: ECOG PERFORMANCE STATUS: 1 - Symptomatic but completely ambulatory  Vitals:   02/19/21 0838  BP: 130/84  Pulse: 78  Resp: 16  Temp: 97.7 F (36.5 C)  SpO2: 100%   Filed Weights   02/19/21 0838  Weight: 196 lb 9.6 oz (89.2 kg)    Physical Exam Constitutional:      Comments: Accompanied by wife.  Ambulating independently.  HENT:     Head: Normocephalic and atraumatic.     Mouth/Throat:     Pharynx: No oropharyngeal exudate.  Eyes:     Pupils: Pupils are equal, round, and reactive to light.  Cardiovascular:     Rate and Rhythm: Normal rate and regular rhythm.  Pulmonary:     Effort: Pulmonary effort is  normal. No respiratory distress.     Breath sounds: Normal breath sounds. No wheezing.  Abdominal:     General: Bowel sounds are normal. There is no distension.     Palpations: Abdomen is soft. There is no mass.     Tenderness: no abdominal tenderness There is no guarding or rebound.  Musculoskeletal:        General: No tenderness. Normal range of motion.     Cervical back: Normal range of motion and neck supple.  Skin:    General: Skin is warm.  Neurological:     Mental Status: He is alert and oriented to person, place, and time.  Psychiatric:        Mood and Affect: Affect normal.     LABORATORY DATA:  I have reviewed the data as listed Lab Results  Component Value Date   WBC 5.3 02/19/2021   HGB 11.7 (L) 02/19/2021   HCT 36.8 (L) 02/19/2021   MCV 85.0 02/19/2021   PLT 223 02/19/2021   Recent Labs    05/08/20 1955 05/09/20 0921 11/19/20 0745 01/22/21 0815 02/04/21 0757 02/19/21 0821  NA 132* 135   < > 136 133* 135  K 4.0 4.0   < > 3.1* 3.5 3.1*  CL 93* 95*   < > 97* 94* 100  CO2 28 29   < > _0 GLUCOSE 592* 449*   < > 332* 493* 195*  BUN 26* 22*   < > _1 CREATININE 1.14 1.07   < > 0.96 1.07 0.75  CALCIUM 9.8 9.6   < > 9.3 9.4 9.0  GFRNONAA >60 >60   < > >60 >60 >60  GFRAA >60 >60  --   --   --   --   PROT  --   --    < > 7.5 7.5 7.0  ALBUMIN  --   --    < > 4.2 4.0 4.0  AST  --   --    < > 32 41 25  ALT  --   --    < >  31 46* 25  ALKPHOS  --   --    < > 173* 191* 89  BILITOT  --   --    < > 0.7 0.5 0.9   < > = values in this interval not displayed.    RADIOGRAPHIC STUDIES: I have personally reviewed the radiological images as listed and agreed with the findings in the report. CT Abdomen Pelvis W Contrast  Result Date: 02/14/2021 CLINICAL DATA:  Follow-up metastatic pancreatic carcinoma. Abdominal bloating. Undergoing chemotherapy. EXAM: CT ABDOMEN AND PELVIS WITH CONTRAST TECHNIQUE: Multidetector CT imaging of the abdomen and pelvis was  performed using the standard protocol following bolus administration of intravenous contrast. CONTRAST:  157mL OMNIPAQUE IOHEXOL 300 MG/ML  SOLN COMPARISON:  11/19/2020 FINDINGS: Lower Chest: No acute findings. Hepatobiliary: Complex cystic and solid mass in the right hepatic lobe has increased in size, currently measuring 9.8 x 6.7 cm on image 21/2, compared to 6.3 x 4.9 cm previously. No other hepatic masses are identified. Gallbladder is unremarkable. No evidence of biliary ductal dilatation. Pancreas: Heterogeneously enhancing mass involving the pancreatic tail currently measures 9.0 x 8.1 cm on image 25/2, compared to 8.2 x 8.1 cm previously. Adjacent lymphadenopathy along its medial aspect currently measures 5.1 x 4.9 cm, compared to 2.7 x 2.4 cm previously. Adjacent lymphadenopathy in the gastrohepatic ligament along the superior aspect of this mass currently measures 4.4 x 3.0 cm on image 16/2, compared to 2.1 x 1.6 cm previously. Spleen: Within normal limits in size and appearance. Adrenals/Urinary Tract: Stable 1.8 cm homogeneous left adrenal mass. Normal appearance of both kidneys. No evidence of ureteral calculi or hydronephrosis. Unremarkable unopacified urinary bladder. Stomach/Bowel: No evidence of obstruction, inflammatory process or abnormal fluid collections. Diverticulosis is seen mainly involving the descending and sigmoid colon, however there is no evidence of diverticulitis. Vascular/Lymphatic: No pathologically enlarged lymph nodes. No acute vascular findings. Aortic atherosclerotic calcification noted. Reproductive:  No mass or other significant abnormality. Other:  None. Musculoskeletal:  No suspicious bone lesions identified. IMPRESSION: Mild increase in size of large pancreatic tail mass, and adjacent peripancreatic lymphadenopathy. Increased size of large complex cystic and solid mass in right hepatic lobe, consistent with hepatic metastasis. Stable 1.8 cm left adrenal mass, although this  is new compared to older study in 2014. Adrenal metastasis cannot be excluded. Colonic diverticulosis. No radiographic evidence of diverticulitis. Aortic Atherosclerosis (ICD10-I70.0). Electronically Signed   By: Marlaine Hind M.D.   On: 02/14/2021 12:26    ASSESSMENT & PLAN:   Pancreatic cancer metastasized to liver [facility]) #Pancreatic acinar cell cancer with metastasis to liver. STAGE IV.  Currently on palliative chemotherapy with FOLFIRINOX s/p 4 cyc;es-June 23rd- 2022-INCREASE in size of large pancreatic tail mass, and adjacent peripancreatic lymphadenopathy. Increased size of large complex cystic and solid mass in right hepatic lobe, consistent with hepatic metastasis. 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 Adrenal metastasis cannot be excluded.  #I reviewed the rather disappointing results of the imaging post 4 cycles of FOLFIRINOX.  Discussed that this unfortunately is a bad prognostic sign.  Again given patient's poor tolerance to FOLFIRINOX/patient preference-we will discontinue FOLFIRINOX at this time.  Patient interested in "pill"-I discussed the role of PARP inhibitor-Lynparza. Discussed the option of Gem-Cis-which has improved response rates up to 70%.  Recommend 16 weeks of therapy-with interim scan.  And if response noted could potentially use PARP inhibitor for maintenance.   # HOLD chemo cycle #5 of FOLFIRINOX today. Labs  today reviewed;  UNacceptable for treatment today- elevated BG [439-see below].  Given general patient's poor tolerance to therapy/reluctance-recommend evaluation with a CT scan with contrast after cycle #4.  Patient interested in "pill" [BRCA inhibitor/Lynparza].    # Pain control: Secondary malignancy- STABLE; continue Percocet 7.5 20 tabs a day; MS Contin twice daily refilled.   #Fatigue-grade 2-3-multifactorial/predominantly chemotherapy side effects [poorly controlled blood glucose-see below].  Hold chemo today.  #  Diabetes-continue current medication-FBS/ PPF ~439- HOLD chemo;  recommend monitoring blood sugars closely.  Recommend reaching out to PCP regarding reevaluation for diabetes/blood glucose control.  # Oral mucositis-grade 2-continue more frequent salt water/biotene rinses add Magic mouthwash hold chemo  # Mild hypokalemia-K-3.1- recommend Kdur 20 mq BID.  # weight loss/dysgeusia-secondary chemotherapy s/p evaluation with Joli last week.   # BRCA- pt has 3 children [Boys-28 & 29, daugter 40]; recommend genetic counselling  # Rash-likely allergic- ? Source- recommend medrol dose pack.   # DISPOSITION:  # NO chemo today; pump off d-3; de-access # genetic counselling re: BRCA mutation #  follow up in 3 weeks [wed/thursday]- MD; labs- cbc/cmp; Dr.B  Addendum: I had a long discussion with the patient/ wife regarding chemotherapeutic option better in terms of response rates compared to [place]  Given detailed data.  However wife adamantly states that he would not go to chemotherapy; and that they would prefer the pill.   All questions were answered. The patient knows to call the clinic with any problems, questions or concerns.    [name], MD 02/20/2021 10:52 PM

## 2021-02-19 NOTE — Progress Notes (Signed)
Nutrition Follow-up:   Patient with metastatic pancreatic cancer with mets to liver.  Patient being switched to oral chemotherapy medication.  No infusion today.   Met with patient in infusion.  Patient reports that appetite is better after being off chemotherapy for the past 2 weeks.  Patient reports taste is back and sores in mouth are gone.  Reports that he has been eating well.  Drinking tea made with splenda and water.  Has tried to cut out sugary beverages.  Reports blood glucose is better.     Medications: reviewed  Labs: glucose 195  Anthropometrics:   Weight 196 lb 9.6 oz today  191 lb on 6/13  197 lb on 5/31 202 lb on 5/16 213 lb on 5/2   NUTRITION DIAGNOSIS: Inadequate oral intake improved   INTERVENTION:  Discussed importance of continuing to choose sugar free beverages and monitor blood glucose Encouraged eating good sources of protein and high calorie foods for weight maintenance    MONITORING, EVALUATION, GOAL: weight trends, intake   NEXT VISIT: Thursday, July 21 after MD visit  Zailey Audia B. Zenia Resides, [place], [place] Registered Dietitian [phone] (mobile)

## 2021-02-19 NOTE — Telephone Encounter (Signed)
Oral Oncology Pharmacist Encounter  Received new prescription for Lynparza (olaparib) for the treatment of metastatic pancreatic cancer, planned duration until disease progression or unacceptable drug toxicity. Patient has a BRCA1 somatic mutation ([employer] testing)  CMP/CBC from 02/19/21 assessed, no relevant lab abnormalities.   Current medication list in Epic reviewed, no DDIs with olaparib identified.  Evaluated chart and no patient barriers to medication adherence identified.   Prescription has been e-scribed to the [facility] for benefits analysis and approval.  Oral Oncology Clinic will continue to follow for insurance authorization, copayment issues, initial counseling and start date.   Darl Pikes, PharmD, BCPS, BCOP, CPP Hematology/Oncology Clinical Pharmacist Practitioner [facility] [phone]  02/19/2021 2:24 PM

## 2021-02-19 NOTE — Progress Notes (Signed)
See progress note.

## 2021-02-19 NOTE — Progress Notes (Signed)
Has a rash all over body that is "itching like crazy" x1 week. Has tried OTC meds with no relief. Talk about CT scan. Pended refill of pain medications

## 2021-02-19 NOTE — Patient Instructions (Signed)
#   make appt with [facility] cancer doctor to check for any other options/ clinical trials  # take medrol dose pack as directed.

## 2021-02-20 ENCOUNTER — Encounter: Payer: Self-pay | Admitting: Internal Medicine

## 2021-02-20 MED ORDER — OLAPARIB 150 MG PO TABS: 300.0000 mg | ORAL_TABLET | Freq: Two times a day (BID) | ORAL | 1 refills | Status: DC

## 2021-02-20 NOTE — Telephone Encounter (Signed)
Thank you Dan [name] for following up with the patient.  Patient can get started on the medication whenever available.  GB

## 2021-02-21 ENCOUNTER — Inpatient Hospital Stay: Payer: BLUE CROSS/BLUE SHIELD

## 2021-02-27 ENCOUNTER — Telehealth: Payer: Self-pay | Admitting: Pharmacy Technician

## 2021-02-27 ENCOUNTER — Other Ambulatory Visit (HOSPITAL_COMMUNITY): Payer: Self-pay

## 2021-02-27 NOTE — Telephone Encounter (Signed)
Oral Oncology Patient Advocate Encounter   Received notification from [place] that prior authorization for Dan [name] is required.   PA submitted on CoverMyMeds Key [id number] Status is pending   Oral Oncology Clinic will continue to follow.  [place] Patient [place] Phone [phone] Fax [phone] 02/27/2021 10:37 AM

## 2021-02-28 NOTE — Telephone Encounter (Addendum)
Oral Chemotherapy Pharmacist Encounter   Called patient to let him know about the insurance denial of the [place] Explained the appeal process to Mr. Sires. Gave him the option of waiting out the appeal process or reconsidering the other IV treatment options previously discussed by Dr. Rogue Bussing. Mr. Azzara wanted to speak with his wife and will call be back with their decision.   Darl Pikes, PharmD, BCPS, BCOP, CPP Hematology/Oncology Clinical Pharmacist [facility] [phone]  02/28/2021 10:26 AM

## 2021-03-04 ENCOUNTER — Other Ambulatory Visit (HOSPITAL_COMMUNITY): Payer: Self-pay

## 2021-03-04 NOTE — Telephone Encounter (Signed)
Oral Oncology Patient Advocate Encounter  Received notification from [place] that the request for prior authorization for [name] has been denied due to: Current plan approved criteria does not allow coverage of the requested medication for the maintenance treatment of metastatic pancreatic adenocarcinoma unless the patient has received a first-line platinum-based chemotherapy for at least 16 weeks.   An appeal is currently being worked on and will be faxed to insurance once completed.  This encounter will continue to be updated until final determination.    [place] Patient [place] Phone [phone] Fax [phone] 03/04/2021 2:19 PM

## 2021-03-07 ENCOUNTER — Telehealth: Payer: Self-pay | Admitting: Pharmacist

## 2021-03-07 ENCOUNTER — Other Ambulatory Visit (HOSPITAL_COMMUNITY): Payer: Self-pay

## 2021-03-07 DIAGNOSIS — C259 Malignant neoplasm of pancreas, unspecified: Secondary | ICD-10-CM

## 2021-03-07 DIAGNOSIS — C787 Secondary malignant neoplasm of liver and intrahepatic bile duct: Secondary | ICD-10-CM

## 2021-03-07 MED ORDER — OLAPARIB 150 MG PO TABS: 300.0000 mg | ORAL_TABLET | Freq: Two times a day (BID) | ORAL | 1 refills | Status: DC

## 2021-03-07 NOTE — Telephone Encounter (Signed)
Oral Oncology Patient Advocate Encounter  Received fax notification that the denial for Dan [name] has been overturned.  Approval dates: 02/05/21-06/07/21  Patient must use [facility].  [name] Phone [phone] Fax [phone] 03/07/2021 2:45 PM

## 2021-03-07 NOTE — Telephone Encounter (Signed)
Oral Chemotherapy Pharmacist Encounter   Called Dan [name] to let him know his [name] was approved. He is required  to fill the medication at [facility] his prescription was faxed there. Provided him with the phone number for [facility].   Set-up MyChart virtual visit for Monday 7/18 for Dan [name] patient education. Medication handout placed in the mail.   Dan [name], Dan [name], Dan [name], Dan [name], Dan [name] Hematology/Oncology Clinical Pharmacist [facility] Oral [place] [facility] [phone]  03/07/2021 4:52 PM

## 2021-03-07 NOTE — Telephone Encounter (Signed)
Oral Oncology Patient Advocate Encounter  Dr Erenest Blank left a message requesting a peer to peer with Dr Rogue Bussing on 03/07/21. Dr Rogue Bussing was able to complete the peer to peer and they will consider an exception for Dan [name].    I will update this encounter with the final determination.  [place] Patient [name] Phone [phone] Fax [phone] 03/07/2021 11:09 AM

## 2021-03-11 ENCOUNTER — Inpatient Hospital Stay: Payer: BLUE CROSS/BLUE SHIELD | Attending: Internal Medicine | Admitting: Pharmacist

## 2021-03-11 DIAGNOSIS — C787 Secondary malignant neoplasm of liver and intrahepatic bile duct: Secondary | ICD-10-CM | POA: Insufficient documentation

## 2021-03-11 DIAGNOSIS — C252 Malignant neoplasm of tail of pancreas: Secondary | ICD-10-CM | POA: Insufficient documentation

## 2021-03-11 DIAGNOSIS — E876 Hypokalemia: Secondary | ICD-10-CM | POA: Insufficient documentation

## 2021-03-11 DIAGNOSIS — E119 Type 2 diabetes mellitus without complications: Secondary | ICD-10-CM | POA: Insufficient documentation

## 2021-03-11 DIAGNOSIS — I10 Essential (primary) hypertension: Secondary | ICD-10-CM | POA: Insufficient documentation

## 2021-03-11 DIAGNOSIS — C259 Malignant neoplasm of pancreas, unspecified: Secondary | ICD-10-CM

## 2021-03-11 NOTE — Progress Notes (Addendum)
Oral Chemotherapy Clinic [facility]  Telephone:([phone] Fax:([phone]) [phone]  Patient Care Team: [facility] as PCP - General Benita Gutter, RN as Oncology Nurse Navigator   Name of the patient: Dan [name]  [id number]  1965/12/30   Date of visit: 03/11/21  Virtual visit: I connected with Dan [name] on 03/11/21 at  1:30 PM EDT by a video enabled telemedicine application and verified that I am speaking with the correct person using two identifiers. I discussed the limitations of evaluation and management by telemedicine and the availability of in person appointments. The patient expressed understanding and agreed to proceed. Locations: Patient: home , Provider: in office  HPI: Patient is a 55 y.o. male with metastatic pancreatic cancer. Pt has a BRCA1 somatic mutation [employer] testing). Recent signs of progression so pt is being transitioned to treatment with [place].   Reason for Consult: Lynparza (olaparib) oral chemotherapy education.   PAST MEDICAL HISTORY: Past Medical History:  Diagnosis Date   Acid reflux    Asthma    Diabetes mellitus without complication (HCC)    History of kidney stones    Hypertension    Kidney stones     HEMATOLOGY/ONCOLOGY HISTORY:  Oncology History Overview Note  # April 2022- [name],  BIOPSY:  - METASTATIC PANCREATIC ACINAR CELL CARCINOMA.; STAGE IV; CT scan shows-approximately 8cm centimeter pancreatic mass; adrenal lesion; also approximately 6 cm liver lesion.   ; LIPASE NORMAL.   # April 18ht, 2022- FOLFIRINOX; udenyca x4 4 cycles-June 2022-progressive disease noted  # June 2022- Gem/cis vs. Garey Ham.   There is sufficient material for ancillary molecular studies if needed  (A1, A3, A4, A2)      # NGS/MOLECULAR TESTS: BRCA-1*    # PALLIATIVE CARE EVALUATION:  # PAIN MANAGEMENT:    DIAGNOSIS: Pancreatic acinar carcinoma  STAGE:  IV       ;  GOALS:pallaitive  CURRENT/MOST RECENT  THERAPY :     Pancreatic cancer metastasized to liver (HCC)  11/23/2020 Initial Diagnosis   Pancreatic cancer metastasized to liver (HCC)    11/28/2020 Cancer Staging   Staging form: Exocrine Pancreas, AJCC 8th Edition - Clinical: Stage IV (cM1) - Signed by Earna Coder, MD on 11/28/2020  Histopathologic type: Acinar cell carcinoma    12/10/2020 -  Chemotherapy    Patient is on Treatment Plan: PANCREAS MODIFIED FOLFIRINOX Q14D X 4 CYCLES         ALLERGIES:  is allergic to [facility] [aprepitant], cherry, ibuprofen, other, and tramadol.  MEDICATIONS:  Current Outpatient Medications  Medication Sig Dispense Refill   amLODipine (NORVASC) 10 MG tablet TAKE 1 TABLET(10 MG) BY MOUTH DAILY (Patient taking differently: Take 10 mg by mouth daily.) 90 tablet 1   chlorthalidone (HYGROTON) 25 MG tablet Take 25 mg by mouth every morning.     FLOVENT HFA 110 MCG/ACT inhaler Inhale 1 puff into the lungs 2 (two) times daily.     JARDIANCE 10 MG TABS tablet Take 10 mg by mouth daily.     lidocaine-prilocaine (EMLA) cream Apply 1 application topically as needed (prior to port a cath needle access on the days of chemotherapy). 30 g 3   losartan (COZAAR) 100 MG tablet TAKE 1 TABLET(100 MG) BY MOUTH DAILY (Patient taking differently: Take 100 mg by mouth daily.) 90 tablet 1   magic mouthwash w/lidocaine SOLN Take 5 mLs by mouth 4 (four) times daily. 80 ml viscous lidocaine 2%, 80 ml Mylanta, 80 ml Diphenhydramine 12.5 mg/5  ml Elixir, 80 ml Nystatin 100,000 Unit suspension, 80 ml Prednisolone 15 mg/11ml, 80 ml Distilled Water. Sig: Swish/Swallow 5-10 ml four times a day as needed. Dispense 480 ml. 3RFs 480 mL 3   methylPREDNISolone (MEDROL DOSEPAK) 4 MG TBPK tablet Use as directed. 21 tablet 1   morphine (MS CONTIN) 30 MG 12 hr tablet Take 1 tablet (30 mg total) by mouth every 12 (twelve) hours. 60 tablet 0   olaparib (LYNPARZA) 150 MG tablet Take 2 tablets (300 mg total) by mouth 2 (two) times daily.  Swallow whole. May take with food to decrease nausea and vomiting. 120 tablet 1   omeprazole (PRILOSEC) 40 MG capsule Take 1 capsule (40 mg total) by mouth daily. 30 capsule 3   ondansetron (ZOFRAN) 8 MG tablet Take 1 tablet (8 mg total) by mouth every 8 (eight) hours as needed for nausea, vomiting or refractory nausea / vomiting. 20 tablet 3   oxyCODONE-acetaminophen (PERCOCET) 7.5-325 MG tablet Take 1 tablet by mouth every 8 (eight) hours as needed for severe pain. 60 tablet 0   potassium chloride SA (KLOR-CON) 20 MEQ tablet 1 pill twice a day 60 tablet 3   prochlorperazine (COMPAZINE) 10 MG tablet Take 1 tablet (10 mg total) by mouth every 6 (six) hours as needed for nausea or vomiting. 30 tablet 3   No current facility-administered medications for this visit.    VITAL SIGNS: There were no vitals taken for this visit. There were no vitals filed for this visit.  Estimated body mass index is 26.66 kg/m as calculated from the following:   Height as of 02/19/21: 6' (1.829 m).   Weight as of 02/19/21: 89.2 kg (196 lb 9.6 oz).  LABS: CBC:    Component Value Date/Time   WBC 5.3 02/19/2021 0821   HGB 11.7 (L) 02/19/2021 0821   HCT 36.8 (L) 02/19/2021 0821   PLT 223 02/19/2021 0821   MCV 85.0 02/19/2021 0821   NEUTROABS 2.8 02/19/2021 0821   LYMPHSABS 1.3 02/19/2021 0821   MONOABS 0.7 02/19/2021 0821   EOSABS 0.4 02/19/2021 0821   BASOSABS 0.1 02/19/2021 0821   Comprehensive Metabolic Panel:    Component Value Date/Time   NA 135 02/19/2021 0821   NA 142 09/08/2019 1509   K 3.1 (L) 02/19/2021 0821   CL 100 02/19/2021 0821   CO2 26 02/19/2021 0821   BUN 14 02/19/2021 0821   BUN 14 09/08/2019 1509   CREATININE 0.75 02/19/2021 0821   GLUCOSE 195 (H) 02/19/2021 0821   CALCIUM 9.0 02/19/2021 0821   AST 25 02/19/2021 0821   ALT 25 02/19/2021 0821   ALKPHOS 89 02/19/2021 0821   BILITOT 0.9 02/19/2021 0821   BILITOT 0.4 09/08/2019 1509   PROT 7.0 02/19/2021 0821   PROT 6.5  09/08/2019 1509   ALBUMIN 4.0 02/19/2021 0821   ALBUMIN 4.4 09/08/2019 1509     Present during today's visit: Pt and his wife  Assessment and Plan: Start plan: [facility] will be delivering medication tomorrow. He will start medication tomorrow, 03/12/21.    Patient Education I spoke with patient for overview of new oral chemotherapy medication: Lynparza (olaparib) for the treatment of metastatic pancreatic cancer, planned duration until disease progression or unacceptable drug toxicity.   Administration: Counseled patient on administration, dosing, side effects, monitoring, drug-food interactions, safe handling, storage, and disposal. Patient will take 2 tablets (300 mg total) by mouth 2 (two) times daily. Swallow whole. May take with food to decrease nausea and vomiting.  Side Effects: Side effects include but not limited to: nausea, vomiting, abdominal pain, diarrhea; muscle or joint pain; weakness or fatigue; decreased hemoglobin, platelet, or white blood cell levels; or decreased renal function.    Adherence: After discussion with patient no patient barriers to medication adherence identified.  Reviewed with patient importance of keeping a medication schedule and plan for any missed doses.  Dan [name] voiced understanding and appreciation. All questions answered. Medication handout provided.  Provided patient with Oral [place] [facility] phone number. Patient knows to call the office with questions or concerns. Oral Chemotherapy Navigation Clinic will continue to follow.  Patient expressed understanding and was in agreement with this plan. He also understands that He can call clinic at any time with any questions, concerns, or complaints.   Medication Access Issues: No issues - pt filling Lynparza at [facility] with $0 copay  Thank you for allowing me to participate in the care of this patient.   Time Total: 20 minutes  Visit consisted of counseling and  education on dealing with issues of symptom management in the setting of serious and potentially life-threatening illness.Greater than 50%  of this time was spent counseling and coordinating care related to the above assessment and plan.  Signed by: Darl Pikes, PharmD, BCPS, Salley Slaughter, CPP Hematology/Oncology Clinical Pharmacist Practitioner [facility] [phone]  03/11/2021 2:16 PM

## 2021-03-14 ENCOUNTER — Inpatient Hospital Stay (HOSPITAL_BASED_OUTPATIENT_CLINIC_OR_DEPARTMENT_OTHER): Payer: BLUE CROSS/BLUE SHIELD | Admitting: Licensed Clinical Social Worker

## 2021-03-14 ENCOUNTER — Telehealth: Payer: Self-pay | Admitting: Licensed Clinical Social Worker

## 2021-03-14 ENCOUNTER — Encounter: Payer: Self-pay | Admitting: Licensed Clinical Social Worker

## 2021-03-14 ENCOUNTER — Inpatient Hospital Stay: Payer: BLUE CROSS/BLUE SHIELD

## 2021-03-14 ENCOUNTER — Inpatient Hospital Stay (HOSPITAL_BASED_OUTPATIENT_CLINIC_OR_DEPARTMENT_OTHER): Payer: BLUE CROSS/BLUE SHIELD | Admitting: Internal Medicine

## 2021-03-14 ENCOUNTER — Encounter: Payer: Self-pay | Admitting: Internal Medicine

## 2021-03-14 DIAGNOSIS — Z803 Family history of malignant neoplasm of breast: Secondary | ICD-10-CM | POA: Insufficient documentation

## 2021-03-14 DIAGNOSIS — C252 Malignant neoplasm of tail of pancreas: Secondary | ICD-10-CM | POA: Diagnosis present

## 2021-03-14 DIAGNOSIS — C787 Secondary malignant neoplasm of liver and intrahepatic bile duct: Secondary | ICD-10-CM

## 2021-03-14 DIAGNOSIS — C259 Malignant neoplasm of pancreas, unspecified: Secondary | ICD-10-CM

## 2021-03-14 DIAGNOSIS — E876 Hypokalemia: Secondary | ICD-10-CM | POA: Diagnosis not present

## 2021-03-14 DIAGNOSIS — E119 Type 2 diabetes mellitus without complications: Secondary | ICD-10-CM | POA: Diagnosis not present

## 2021-03-14 DIAGNOSIS — I10 Essential (primary) hypertension: Secondary | ICD-10-CM | POA: Diagnosis not present

## 2021-03-14 LAB — COMPREHENSIVE METABOLIC PANEL
ALT: 18 U/L (ref 0–44)
AST: 22 U/L (ref 15–41)
Albumin: 4.1 g/dL (ref 3.5–5.0)
Alkaline Phosphatase: 82 U/L (ref 38–126)
Anion gap: 10 (ref 5–15)
BUN: 17 mg/dL (ref 6–20)
CO2: 27 mmol/L (ref 22–32)
Calcium: 9 mg/dL (ref 8.9–10.3)
Chloride: 101 mmol/L (ref 98–111)
Creatinine, Ser: 1.18 mg/dL (ref 0.61–1.24)
GFR, Estimated: >60 mL/min (ref >60)
Glucose, Bld: 182 mg/dL — ABNORMAL HIGH (ref 70–99)
Potassium: 3.8 mmol/L (ref 3.5–5.1)
Sodium: 138 mmol/L (ref 135–145)
Total Bilirubin: 0.8 mg/dL (ref 0.3–1.2)
Total Protein: 7.2 g/dL (ref 6.5–8.1)

## 2021-03-14 LAB — CBC WITH DIFFERENTIAL/PLATELET
Abs Immature Granulocytes: 0 10*3/uL (ref 0.00–0.07)
Basophils Absolute: 0 10*3/uL (ref 0.0–0.1)
Basophils Relative: 1 %
Eosinophils Absolute: 0.3 10*3/uL (ref 0.0–0.5)
Eosinophils Relative: 7 %
HCT: 37.7 % — ABNORMAL LOW (ref 39.0–52.0)
Hemoglobin: 12 g/dL — ABNORMAL LOW (ref 13.0–17.0)
Immature Granulocytes: 0 %
Lymphocytes Relative: 32 %
Lymphs Abs: 1.4 10*3/uL (ref 0.7–4.0)
MCH: 26.8 pg (ref 26.0–34.0)
MCHC: 31.8 g/dL (ref 30.0–36.0)
MCV: 84.2 fL (ref 80.0–100.0)
Monocytes Absolute: 0.4 10*3/uL (ref 0.1–1.0)
Monocytes Relative: 8 %
Neutro Abs: 2.2 10*3/uL (ref 1.7–7.7)
Neutrophils Relative %: 52 %
Platelets: 136 10*3/uL — ABNORMAL LOW (ref 150–400)
RBC: 4.48 MIL/uL (ref 4.22–5.81)
RDW: 14.9 % (ref 11.5–15.5)
WBC: 4.3 10*3/uL (ref 4.0–10.5)
nRBC: 0 % (ref 0.0–0.2)

## 2021-03-14 MED ORDER — HYDROXYZINE HCL 10 MG PO TABS: 10.0000 mg | ORAL_TABLET | Freq: Three times a day (TID) | ORAL | 0 refills | Status: DC | PRN

## 2021-03-14 MED ORDER — MORPHINE SULFATE ER 30 MG PO TBCR: 30.0000 mg | EXTENDED_RELEASE_TABLET | Freq: Two times a day (BID) | ORAL | 0 refills | Status: DC

## 2021-03-14 MED ORDER — OXYCODONE-ACETAMINOPHEN 7.5-325 MG PO TABS: 1.0000 | ORAL_TABLET | Freq: Three times a day (TID) | ORAL | 0 refills | Status: DC | PRN

## 2021-03-14 NOTE — Progress Notes (Signed)
Nutrition Follow-up:    Patient with metastatic pancreatic cancer with mets to liver.  Patient has started oral chemotherapy, olaparib.    Met with patient and wife following MD visit today.  Patient says that he started chemo pill on 7/19 in the pm.  Denies any nutrition impact symptoms at this time. Says that appetite is good and eating well.      Medications: reviewed  Labs: glucose 182 (improved)  Anthropometrics:   Weight increased to 199 lb 9.6 oz  196 lb 9.6 oz on 6/28 191 lb on 6/13 197 lb on 5/31 202 lb on 5/16 213 lb on 5/2   NUTRITION DIAGNOSIS: Inadequate oral intake improved   INTERVENTION:  Encouraged patient to eat well balanced diet including good sources of protein.  With good appetite and weight increasing encouraged patient to be mindful of foods that will increase blood glucose.  Discussed food examples.   Contact information provided    MONITORING, EVALUATION, GOAL: weight trends, intake   NEXT VISIT: Thursday, August 11th after MD visit  Lavonne Cass B. Zenia Resides, [place], [place] Registered Dietitian [phone] (mobile)

## 2021-03-14 NOTE — Progress Notes (Signed)
REFERRING PROVIDER: [name], MD [name],  [place] [id number]  PRIMARY PROVIDER:  [facility]  PRIMARY REASON FOR VISIT:  1. Family history of breast cancer   2. Pancreatic cancer metastasized to liver [facility])      HISTORY OF PRESENT ILLNESS:   Dan [name], a 55 y.o. male, was seen for a [place] cancer genetics consultation at the request of Dr. Rogue Bussing due to a personal and family history of cancer.  Dan [name] presents to clinic today to discuss the possibility of a hereditary predisposition to cancer, genetic testing, and to further clarify his future cancer risks, as well as potential cancer risks for family members.   In 2022, at the age of 51, Dan [name] was diagnosed with metastatic pancreatic acinar cell carcinoma. The treatment plan includes chemotherapy. BRCA1 c.303T>G mutation identified on [employer].    CANCER HISTORY:  Oncology History Overview Note  # April 2022- [name],  BIOPSY:  - METASTATIC PANCREATIC ACINAR CELL CARCINOMA.; STAGE IV; CT scan shows-approximately 8cm centimeter pancreatic mass; adrenal lesion; also approximately 6 cm liver lesion.   ; LIPASE NORMAL.   # April 18ht, 2022- FOLFIRINOX; udenyca x4 4 cycles-June 2022-progressive disease noted-[S/p palliative chemotherapy with FOLFIRINOX s/p 4 cyc;es-June 23rd- 2022-INCREASE in size of large pancreatic tail mass, and adjacent peripancreatic lymphadenopathy. Increased size of large complex cystic and solid mass in right hepatic lobe, consistent with hepatic metastasis. 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 Adrenal metastasis cannot be excluded]-discontinue FOLFIRINOX; declines chemotherapy-cis gem  # July 21st 2022-Lynparza 300 mg twice daily [BRCA-1 mutation-somatic versus germline/awaiting genetic] ]  # July 19th 2022- Lynparza 300 mg BID   There is sufficient material for ancillary molecular studies if needed  (A1, A3,  A4, A2)      # NGS/MOLECULAR TESTS: BRCA-1*    # PALLIATIVE CARE EVALUATION:  # PAIN MANAGEMENT:    DIAGNOSIS: Pancreatic acinar carcinoma  STAGE:  IV       ;  GOALS:pallaitive  CURRENT/MOST RECENT THERAPY :     Pancreatic cancer metastasized to liver ([place])  11/23/2020 Initial Diagnosis   Pancreatic cancer metastasized to liver ([place])    11/28/2020 Cancer Staging   Staging form: Exocrine Pancreas, AJCC 8th Edition - Clinical: Stage IV (cM1) - Signed by [name], MD on 11/28/2020  Histopathologic type: Acinar cell carcinoma    12/10/2020 -  Chemotherapy    Patient is on Treatment Plan: PANCREAS MODIFIED FOLFIRINOX Q14D X 4 CYCLES          Past Medical History:  Diagnosis Date   Acid reflux    Asthma    Diabetes mellitus without complication ([place])    Family history of breast cancer    History of kidney stones    Hypertension    Kidney stones     Past Surgical History:  Procedure Laterality Date   COLONOSCOPY WITH PROPOFOL N/A 12/02/2019   Procedure: COLONOSCOPY WITH PROPOFOL;  Surgeon: [name], MD;  Location: [facility] ENDOSCOPY;  Service: Gastroenterology;  Laterality: N/A;   FRACTURE SURGERY Left at age 5   pelvic fracture   HEMORRHOID SURGERY N/A 01/31/2020   Procedure: HEMORRHOIDECTOMY;  Surgeon: Jules Husbands, MD;  Location: [facility] ORS;  Service: General;  Laterality: N/A;   IR IMAGING GUIDED PORT INSERTION  11/30/2020   pelvis fractur     ROTATOR CUFF REPAIR      Social History   Socioeconomic  History   Marital status: Married    Spouse name: Not on file   Number of children: Not on file   Years of education: Not on file   Highest education level: Not on file  Occupational History   Not on file  Tobacco Use   Smoking status: Former    Packs/day: 0.50    Years: 25.00    Pack years: 12.50    Types: Cigarettes   Smokeless tobacco: Never  Vaping Use   Vaping Use: Never used  Substance and Sexual Activity   Alcohol use: Yes     Alcohol/week: 6.0 standard drinks    Types: 6 Cans of beer per week    Comment: weekends only   Drug use: No   Sexual activity: Yes  Other Topics Concern   Not on file  Social History Narrative   Works at [employer]. In [place]. Quit smoking 2021 June. Social. Alcohol.    Social Determinants of Health   Financial Resource Strain: Not on file  Food Insecurity: Not on file  Transportation Needs: Not on file  Physical Activity: Not on file  Stress: Not on file  Social Connections: Not on file     FAMILY HISTORY:  We obtained a detailed, 4-generation family history.  Significant diagnoses are listed below: Family History  Problem Relation Age of Onset   Alzheimer's disease Mother    Heart attack Father    Cancer Sister        2 sisters 42 and 47- both died of breast cancer   Diabetes Brother    Hypertension Brother    Asthma Son    Dan [name] has 1 son, 34, and a stepdaughter and stepson. He has 6 sisters and 7 brothers. Two sisters had breast cancer, one at 77 and one at 21, both passed from this. Two brothers have passed as well.   Dan [name] mother died at 71 of Alzheimer's. No known cancers on this side of the family.  Dan [name] father died at 62 of heart issues, no known cancers on this side of the family.  Dan [name] is unaware of previous family history of genetic testing for hereditary cancer risks. Patient's maternal ancestors are of Black/American [place] descent, and paternal ancestors are of [place] descent. There is no reported Ashkenazi Jewish ancestry. There is no known consanguinity.    GENETIC COUNSELING ASSESSMENT: Dan [name] is a 55 y.o. male with a personal and family history of cancer which is somewhat suggestive of a hereditary cancer syndrome and predisposition to cancer. We, therefore, discussed and recommended the following at today's visit.   DISCUSSION: We discussed that approximately 5-10% of pancreatic cancer is hereditary. Most cases of  hereditary pancreatic cancer are associated with BRCA1/BRCA2 genes, although there are other genes associated with hereditary pancreatic cancer as well. BRCA1/BRCA2 are also assocaited with breast cancer. There are other genes we can test as well. Cancers and risks are gene specific.  We discussed that testing is beneficial for several reasons including knowing if an individual is a candidate for certain targeted therapies, knowing about other cancer risks, identifying potential screening and risk-reduction options that may be appropriate, and to understand if other family members could be at risk for cancer and allow them to undergo genetic testing.   We reviewed the characteristics, features and inheritance patterns of hereditary cancer syndromes. We also discussed genetic testing, including the appropriate family members to test, the process of testing, insurance coverage and turn-around-time for results. We  discussed the implications of a negative, positive and/or variant of uncertain significant result. We recommended Dan [name] pursue genetic testing for the [employer] CancerNext-Expanded+RNA gene panel.   The CancerNext-Expanded + RNAinsight gene panel offered by [employer] and includes sequencing and rearrangement analysis for the following 77 genes: IP, ALK, APC*, ATM*, AXIN2, BAP1, BARD1, BLM, BMPR1A, BRCA1*, BRCA2*, BRIP1*, CDC73, CDH1*,CDK4, CDKN1B, CDKN2A, CHEK2*, CTNNA1, DICER1, FANCC, FH, FLCN, GALNT12, KIF1B, LZTR1, MAX, MEN1, MET, MLH1*, MSH2*, MSH3, MSH6*, MUTYH*, NBN, NF1*, NF2, NTHL1, PALB2*, PHOX2B, PMS2*, POT1, PRKAR1A, PTCH1, PTEN*, RAD51C*, RAD51D*,RB1, RECQL, RET, SDHA, SDHAF2, SDHB, SDHC, SDHD, SMAD4, SMARCA4, SMARCB1, SMARCE1, STK11, SUFU, TMEM127, TP53*,TSC1, TSC2, VHL and XRCC2 (sequencing and deletion/duplication); EGFR, EGLN1, HOXB13, KIT, MITF, PDGFRA, POLD1 and POLE (sequencing only); EPCAM and GREM1 (deletion/duplication only).  Based on Dan [name] personal and family history  of cancer, he meets medical criteria for genetic testing. Despite that he meets criteria, he may still have an out of pocket cost. We discussed that if his out of pocket cost for testing is over $100, the laboratory will call and confirm whether he wants to proceed with testing.  If the out of pocket cost of testing is less than $100 he will be billed by the genetic testing laboratory.   PLAN: After considering the risks, benefits, and limitations, Dan [name] provided informed consent to pursue genetic testing and the blood sample was sent to [facility] for analysis of the CancerNext-Expanded+RNA panel. Results should be available within approximately 2-3 weeks' time, at which point they will be disclosed by telephone to Dan [name], as will any additional recommendations warranted by these results. Dan [name] will receive a summary of his genetic counseling visit and a copy of his results once available. This information will also be available in Epic.   Dan [name] questions were answered to his satisfaction today. Our contact information was provided should additional questions or concerns arise. Thank you for the referral and allowing [place] to share in the care of your patient.   Faith Rogue, MS, [facility] Genetic Counselor [email or web address] Phone: [phone]  The patient was seen for a total of 20 minutes in face-to-face genetic counseling.  Patient was seen with his wife, Mardene Celeste. Dr. Grayland Ormond was available for discussion regarding this case.   _______________________________________________________________________ For Office Staff:  Number of people involved in session: 2 Was an Intern/ student involved with case: no

## 2021-03-14 NOTE — Assessment & Plan Note (Addendum)
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 STAGE IV-progression status post 4 cycles of FOLFIRINOX.  Offered cis gem as best response; declines chemotherapy. Pt started on Lynpzarxa on 03/12/2021.   # Reviewed the limited/modest response rates with Lynparza especially after progression of disease on first-line FOLFIRINOX.  Response rates 30% or less.  Lynparza-again again reviewed the potential side effects including but not limited to nausea vomiting diarrhea anemia etc. Prophylactic zofran.    # Pain control: Secondary malignancy- STABLE; continue Percocet 7.5 20 tabs a day; MS Contin twice daily refilled.   # Diabetes-continue current medication-FBS/ PPF ~182; improved.  # Mild hypokalemia-K-3.6; STOP Kdur [? reflux]  # BRCA- pt has 3 children [Boys-28 & 29, daugter 40]; awaiting genetic counseling today.   # Rash-likely allergic- ? Source; s/p medrol dose pack [prior to Lynparza]-improved however continues to have itching.  Add Atarax as needed.  # DISPOSITION:  #  follow up in 3 weeks- - MD; labs- cbc/cmp; Dr.B

## 2021-03-14 NOTE — Progress Notes (Signed)
[place] CONSULT NOTE  Patient Care Team: [facility] as PCP - General Clent Jacks, RN as Oncology Nurse Navigator  CHIEF COMPLAINTS/PURPOSE OF CONSULTATION: Pancreatic cancer   Oncology History Overview Note  # April 2022- [name],  BIOPSY:  - METASTATIC PANCREATIC ACINAR CELL CARCINOMA.; STAGE IV; CT scan shows-approximately 8cm centimeter pancreatic mass; adrenal lesion; also approximately 6 cm liver lesion.   ; LIPASE NORMAL.   # April 18ht, 2022- FOLFIRINOX; udenyca x4 4 cycles-June 2022-progressive disease noted-[S/p palliative chemotherapy with FOLFIRINOX s/p 4 cyc;es-June 23rd- 2022-INCREASE in size of large pancreatic tail mass, and adjacent peripancreatic lymphadenopathy. Increased size of large complex cystic and solid mass in right hepatic lobe, consistent with hepatic metastasis. 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 Adrenal metastasis cannot be excluded]-discontinue FOLFIRINOX; declines chemotherapy-cis gem  # July 21st 2022-Lynparza 300 mg twice daily [BRCA-1 mutation-somatic versus germline/awaiting genetic] ]  # July 19th 2022- Lynparza 300 mg BID   There is sufficient material for ancillary molecular studies if needed  (A1, A3, A4, A2)      # NGS/MOLECULAR TESTS: BRCA-1*    # PALLIATIVE CARE EVALUATION:  # PAIN MANAGEMENT:    DIAGNOSIS: Pancreatic acinar carcinoma  STAGE:  IV       ;  GOALS:pallaitive  CURRENT/MOST RECENT THERAPY :     Pancreatic cancer metastasized to liver ([place])  11/23/2020 Initial Diagnosis   Pancreatic cancer metastasized to liver ([place])    11/28/2020 Cancer Staging   Staging form: Exocrine Pancreas, AJCC 8th Edition - Clinical: Stage IV (cM1) - Signed by [name], MD on 11/28/2020  Histopathologic type: Acinar cell carcinoma    12/10/2020 -  Chemotherapy    Patient is on Treatment Plan: PANCREAS MODIFIED FOLFIRINOX Q14D X 4 CYCLES           HISTORY OF PRESENTING ILLNESS:  Dan [name] 55 y.o.  male with pancreatic ACINAR cell carcinoma-stage IV metastatic liver noted to have progressive disease after 4 cycles of FOLFIRINOX chemotherapy.   Patient started taking Lynparza approximately 2 days ago.  He has been taking antiemetics-prophylactically.  Denies any vomiting.  No nausea.  Chronic abdominal pain-not any worse stable on MS Contin and Percocet.   Is gaining weight.  Appetite is coming back.   Review of Systems  Constitutional:  Positive for malaise/fatigue. Negative for chills, diaphoresis and fever.  HENT:  Negative for nosebleeds.   Eyes:  Negative for double vision.  Respiratory:  Negative for cough, hemoptysis, sputum production, shortness of breath and wheezing.   Cardiovascular:  Negative for chest pain, palpitations, orthopnea and leg swelling.  Gastrointestinal:  Positive for abdominal pain and nausea. Negative for blood in stool, constipation, diarrhea, heartburn, melena and vomiting.  Genitourinary:  Negative for dysuria, frequency and urgency.  Musculoskeletal:  Negative for back pain and joint pain.  Skin: Negative.  Negative for itching and rash.  Neurological:  Negative for dizziness, tingling, focal weakness, weakness and headaches.  Endo/Heme/Allergies:  Does not bruise/bleed easily.  Psychiatric/Behavioral:  Negative for depression. The patient is not nervous/anxious and does not have insomnia.     MEDICAL HISTORY:  Past Medical History:  Diagnosis Date   Acid reflux    Asthma    Diabetes mellitus without complication ([place])    Family history of breast cancer    History of kidney stones    Hypertension    Kidney stones     SURGICAL  HISTORY: Past Surgical History:  Procedure Laterality Date   COLONOSCOPY WITH PROPOFOL N/A 12/02/2019   Procedure: COLONOSCOPY WITH PROPOFOL;  Surgeon: [name], MD;  Location: [facility] ENDOSCOPY;  Service: Gastroenterology;  Laterality: N/A;   FRACTURE  SURGERY Left at age 67   pelvic fracture   HEMORRHOID SURGERY N/A 01/31/2020   Procedure: HEMORRHOIDECTOMY;  Surgeon: Jules Husbands, MD;  Location: [facility] ORS;  Service: General;  Laterality: N/A;   IR IMAGING GUIDED PORT INSERTION  11/30/2020   pelvis fractur     ROTATOR CUFF REPAIR      SOCIAL HISTORY: Social History   Socioeconomic History   Marital status: Married    Spouse name: Not on file   Number of children: Not on file   Years of education: Not on file   Highest education level: Not on file  Occupational History   Not on file  Tobacco Use   Smoking status: Former    Packs/day: 0.50    Years: 25.00    Pack years: 12.50    Types: Cigarettes   Smokeless tobacco: Never  Vaping Use   Vaping Use: Never used  Substance and Sexual Activity   Alcohol use: Yes    Alcohol/week: 6.0 standard drinks    Types: 6 Cans of beer per week    Comment: weekends only   Drug use: No   Sexual activity: Yes  Other Topics Concern   Not on file  Social History Narrative   Works at [employer]. In [place]. Quit smoking 2021 June. Social. Alcohol.    Social Determinants of Health   Financial Resource Strain: Not on file  Food Insecurity: Not on file  Transportation Needs: Not on file  Physical Activity: Not on file  Stress: Not on file  Social Connections: Not on file  Intimate Partner Violence: Not on file    FAMILY HISTORY: Family History  Problem Relation Age of Onset   Alzheimer's disease Mother    Heart attack Father    Cancer Sister        2 sisters 76 and 28- both died of breast cancer   Diabetes Brother    Hypertension Brother    Asthma Son     ALLERGIES:  is allergic to [place] [aprepitant], cherry, ibuprofen, other, and tramadol.  MEDICATIONS:  Current Outpatient Medications  Medication Sig Dispense Refill   amLODipine (NORVASC) 10 MG tablet TAKE 1 TABLET(10 MG) BY MOUTH DAILY (Patient taking differently: Take 10 mg by mouth daily.) 90 tablet 1   chlorthalidone  (HYGROTON) 25 MG tablet Take 25 mg by mouth every morning.     FLOVENT HFA 110 MCG/ACT inhaler Inhale 1 puff into the lungs 2 (two) times daily.     hydrOXYzine (ATARAX/VISTARIL) 10 MG tablet Take 1 tablet (10 mg total) by mouth 3 (three) times daily as needed. For itching. 30 tablet 0   JARDIANCE 10 MG TABS tablet Take 10 mg by mouth daily.     lidocaine-prilocaine (EMLA) cream Apply 1 application topically as needed (prior to port a cath needle access on the days of chemotherapy). 30 g 3   losartan (COZAAR) 100 MG tablet TAKE 1 TABLET(100 MG) BY MOUTH DAILY (Patient taking differently: Take 100 mg by mouth daily.) 90 tablet 1   magic mouthwash w/lidocaine SOLN Take 5 mLs by mouth 4 (four) times daily. 80 ml viscous lidocaine 2%, 80 ml Mylanta, 80 ml Diphenhydramine 12.5 mg/5 ml Elixir, 80 ml Nystatin 100,000 Unit suspension, 80 ml Prednisolone 15 mg/75m, 80  ml Distilled Water. Sig: Swish/Swallow 5-10 ml four times a day as needed. Dispense 480 ml. 3RFs 480 mL 3   methylPREDNISolone (MEDROL DOSEPAK) 4 MG TBPK tablet Use as directed. 21 tablet 1   olaparib (LYNPARZA) 150 MG tablet Take 2 tablets (300 mg total) by mouth 2 (two) times daily. Swallow whole. May take with food to decrease nausea and vomiting. 120 tablet 1   omeprazole (PRILOSEC) 40 MG capsule Take 1 capsule (40 mg total) by mouth daily. 30 capsule 3   ondansetron (ZOFRAN) 8 MG tablet Take 1 tablet (8 mg total) by mouth every 8 (eight) hours as needed for nausea, vomiting or refractory nausea / vomiting. 20 tablet 3   potassium chloride SA (KLOR-CON) 20 MEQ tablet 1 pill twice a day 60 tablet 3   prochlorperazine (COMPAZINE) 10 MG tablet Take 1 tablet (10 mg total) by mouth every 6 (six) hours as needed for nausea or vomiting. 30 tablet 3   morphine (MS CONTIN) 30 MG 12 hr tablet Take 1 tablet (30 mg total) by mouth every 12 (twelve) hours. 60 tablet 0   oxyCODONE-acetaminophen (PERCOCET) 7.5-325 MG tablet Take 1 tablet by mouth every 8  (eight) hours as needed for severe pain. 60 tablet 0   No current facility-administered medications for this visit.      Marland Kitchen  PHYSICAL EXAMINATION: ECOG PERFORMANCE STATUS: 1 - Symptomatic but completely ambulatory  Vitals:   03/14/21 0956  BP: 118/77  Pulse: 79  Resp: 16  Temp: 97.7 F (36.5 C)  SpO2: 100%   Filed Weights   03/14/21 0956  Weight: 199 lb 9.6 oz (90.5 kg)    Physical Exam Constitutional:      Comments: Accompanied by wife.  Ambulating independently.  HENT:     Head: Normocephalic and atraumatic.     Mouth/Throat:     Pharynx: No oropharyngeal exudate.  Eyes:     Pupils: Pupils are equal, round, and reactive to light.  Cardiovascular:     Rate and Rhythm: Normal rate and regular rhythm.  Pulmonary:     Effort: Pulmonary effort is normal. No respiratory distress.     Breath sounds: Normal breath sounds. No wheezing.  Abdominal:     General: Bowel sounds are normal. There is no distension.     Palpations: Abdomen is soft. There is no mass.     Tenderness: There is no abdominal tenderness. There is no guarding or rebound.  Musculoskeletal:        General: No tenderness. Normal range of motion.     Cervical back: Normal range of motion and neck supple.  Skin:    General: Skin is warm.  Neurological:     Mental Status: He is alert and oriented to person, place, and time.  Psychiatric:        Mood and Affect: Affect normal.     LABORATORY DATA:  I have reviewed the data as listed Lab Results  Component Value Date   WBC 4.3 03/14/2021   HGB 12.0 (L) 03/14/2021   HCT 37.7 (L) 03/14/2021   MCV 84.2 03/14/2021   PLT 136 (L) 03/14/2021   Recent Labs    05/08/20 1955 05/09/20 0921 11/19/20 0745 02/04/21 0757 02/19/21 0821 03/14/21 0921  NA 132* 135   < > 133* 135 138  K 4.0 4.0   < > 3.5 3.1* 3.8  CL 93* 95*   < > 94* 100 101  CO2 28 29   < > 28 26 27  GLUCOSE 592* 449*   < > 493* 195* 182*  BUN 26* 22*   < > _0 CREATININE 1.14  1.07   < > 1.07 0.75 1.18  CALCIUM 9.8 9.6   < > 9.4 9.0 9.0  GFRNONAA >60 >60   < > >60 >60 >60  GFRAA >60 >60  --   --   --   --   PROT  --   --    < > 7.5 7.0 7.2  ALBUMIN  --   --    < > 4.0 4.0 4.1  AST  --   --    < > 41 25 22  ALT  --   --    < > 46* 25 18  ALKPHOS  --   --    < > 191* 89 82  BILITOT  --   --    < > 0.5 0.9 0.8   < > = values in this interval not displayed.    RADIOGRAPHIC STUDIES: I have personally reviewed the radiological images as listed and agreed with the findings in the report. CT Abdomen Pelvis W Contrast  Result Date: 02/14/2021 CLINICAL DATA:  Follow-up metastatic pancreatic carcinoma. Abdominal bloating. Undergoing chemotherapy. EXAM: CT ABDOMEN AND PELVIS WITH CONTRAST TECHNIQUE: Multidetector CT imaging of the abdomen and pelvis was performed using the standard protocol following bolus administration of intravenous contrast. CONTRAST:  139m OMNIPAQUE IOHEXOL 300 MG/ML  SOLN COMPARISON:  11/19/2020 FINDINGS: Lower Chest: No acute findings. Hepatobiliary: Complex cystic and solid mass in the right hepatic lobe has increased in size, currently measuring 9.8 x 6.7 cm on image 21/2, compared to 6.3 x 4.9 cm previously. No other hepatic masses are identified. Gallbladder is unremarkable. No evidence of biliary ductal dilatation. Pancreas: Heterogeneously enhancing mass involving the pancreatic tail currently measures 9.0 x 8.1 cm on image 25/2, compared to 8.2 x 8.1 cm previously. Adjacent lymphadenopathy along its medial aspect currently measures 5.1 x 4.9 cm, compared to 2.7 x 2.4 cm previously. Adjacent lymphadenopathy in the gastrohepatic ligament along the superior aspect of this mass currently measures 4.4 x 3.0 cm on image 16/2, compared to 2.1 x 1.6 cm previously. Spleen: Within normal limits in size and appearance. Adrenals/Urinary Tract: Stable 1.8 cm homogeneous left adrenal mass. Normal appearance of both kidneys. No evidence of ureteral calculi or  hydronephrosis. Unremarkable unopacified urinary bladder. Stomach/Bowel: No evidence of obstruction, inflammatory process or abnormal fluid collections. Diverticulosis is seen mainly involving the descending and sigmoid colon, however there is no evidence of diverticulitis. Vascular/Lymphatic: No pathologically enlarged lymph nodes. No acute vascular findings. Aortic atherosclerotic calcification noted. Reproductive:  No mass or other significant abnormality. Other:  None. Musculoskeletal:  No suspicious bone lesions identified. IMPRESSION: Mild increase in size of large pancreatic tail mass, and adjacent peripancreatic lymphadenopathy. Increased size of large complex cystic and solid mass in right hepatic lobe, consistent with hepatic metastasis. 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 Adrenal metastasis cannot be excluded. Colonic diverticulosis. No radiographic evidence of diverticulitis. Aortic Atherosclerosis (ICD10-I70.0). Electronically Signed   By: JMarlaine HindM.D.   On: 02/14/2021 12:26    ASSESSMENT & PLAN:   Pancreatic cancer metastasized to liver ([facility] #Pancreatic acinar cell cancer with metastasis to liver. STAGE IV-progression status post 4 cycles of FOLFIRINOX.  Offered cis gem as best response; declines chemotherapy. Pt started on Lynpzarxa on 03/12/2021.   # Reviewed the limited/modest response  rates with Lynparza especially after progression of disease on first-line FOLFIRINOX.  Response rates 30% or less.  Lynparza-again again reviewed the potential side effects including but not limited to nausea vomiting diarrhea anemia etc. Prophylactic zofran.    # Pain control: Secondary malignancy- STABLE; continue Percocet 7.5 20 tabs a day; MS Contin twice daily refilled.   # Diabetes-continue current medication-FBS/ PPF ~182; improved.  # Mild hypokalemia-K-3.6; STOP Kdur [? reflux]  # BRCA- pt has 3 children [Boys-28 & 29, daugter 40];  awaiting genetic counseling today.   # Rash-likely allergic- ? Source; s/p medrol dose pack [prior to Lynparza]-improved however continues to have itching.  Add Atarax as needed.  # DISPOSITION:  #  follow up in 3 weeks- - MD; labs- cbc/cmp; Dr.B  All questions were answered. The patient knows to call the clinic with any problems, questions or concerns.    [name], MD 03/14/2021 11:10 AM

## 2021-03-15 ENCOUNTER — Encounter: Payer: Self-pay | Admitting: Internal Medicine

## 2021-03-15 NOTE — Telephone Encounter (Signed)
error 

## 2021-04-02 ENCOUNTER — Telehealth: Payer: Self-pay | Admitting: Licensed Clinical Social Worker

## 2021-04-02 NOTE — Telephone Encounter (Signed)
Disclosed positive genetic test result. Patient tested positive for BRCA1 pathogenic variant. Discussed this result briefly, made appointment to discuss further with patient when he is here Thursday, 8/11.

## 2021-04-03 ENCOUNTER — Encounter: Payer: Self-pay | Admitting: Licensed Clinical Social Worker

## 2021-04-04 ENCOUNTER — Inpatient Hospital Stay: Payer: BLUE CROSS/BLUE SHIELD

## 2021-04-04 ENCOUNTER — Inpatient Hospital Stay (HOSPITAL_BASED_OUTPATIENT_CLINIC_OR_DEPARTMENT_OTHER): Payer: BLUE CROSS/BLUE SHIELD | Admitting: Internal Medicine

## 2021-04-04 ENCOUNTER — Inpatient Hospital Stay: Payer: BLUE CROSS/BLUE SHIELD | Admitting: Licensed Clinical Social Worker

## 2021-04-04 ENCOUNTER — Inpatient Hospital Stay: Payer: BLUE CROSS/BLUE SHIELD | Attending: Internal Medicine

## 2021-04-04 DIAGNOSIS — C252 Malignant neoplasm of tail of pancreas: Secondary | ICD-10-CM | POA: Insufficient documentation

## 2021-04-04 DIAGNOSIS — Z1501 Genetic susceptibility to malignant neoplasm of breast: Secondary | ICD-10-CM | POA: Insufficient documentation

## 2021-04-04 DIAGNOSIS — Z452 Encounter for adjustment and management of vascular access device: Secondary | ICD-10-CM | POA: Insufficient documentation

## 2021-04-04 DIAGNOSIS — C787 Secondary malignant neoplasm of liver and intrahepatic bile duct: Secondary | ICD-10-CM | POA: Insufficient documentation

## 2021-04-04 DIAGNOSIS — Z95828 Presence of other vascular implants and grafts: Secondary | ICD-10-CM

## 2021-04-04 DIAGNOSIS — Z1379 Encounter for other screening for genetic and chromosomal anomalies: Secondary | ICD-10-CM | POA: Insufficient documentation

## 2021-04-04 DIAGNOSIS — Z1509 Genetic susceptibility to other malignant neoplasm: Secondary | ICD-10-CM | POA: Insufficient documentation

## 2021-04-04 DIAGNOSIS — E119 Type 2 diabetes mellitus without complications: Secondary | ICD-10-CM | POA: Insufficient documentation

## 2021-04-04 DIAGNOSIS — R112 Nausea with vomiting, unspecified: Secondary | ICD-10-CM | POA: Diagnosis not present

## 2021-04-04 DIAGNOSIS — E876 Hypokalemia: Secondary | ICD-10-CM | POA: Insufficient documentation

## 2021-04-04 DIAGNOSIS — Z803 Family history of malignant neoplasm of breast: Secondary | ICD-10-CM

## 2021-04-04 DIAGNOSIS — C259 Malignant neoplasm of pancreas, unspecified: Secondary | ICD-10-CM

## 2021-04-04 DIAGNOSIS — T451X5A Adverse effect of antineoplastic and immunosuppressive drugs, initial encounter: Secondary | ICD-10-CM

## 2021-04-04 LAB — COMPREHENSIVE METABOLIC PANEL
ALT: 16 U/L (ref 0–44)
AST: 23 U/L (ref 15–41)
Albumin: 3.9 g/dL (ref 3.5–5.0)
Alkaline Phosphatase: 67 U/L (ref 38–126)
Anion gap: 7 (ref 5–15)
BUN: 11 mg/dL (ref 6–20)
CO2: 26 mmol/L (ref 22–32)
Calcium: 8.8 mg/dL — ABNORMAL LOW (ref 8.9–10.3)
Chloride: 105 mmol/L (ref 98–111)
Creatinine, Ser: 0.96 mg/dL (ref 0.61–1.24)
GFR, Estimated: >60 mL/min (ref >60)
Glucose, Bld: 148 mg/dL — ABNORMAL HIGH (ref 70–99)
Potassium: 3.1 mmol/L — ABNORMAL LOW (ref 3.5–5.1)
Sodium: 138 mmol/L (ref 135–145)
Total Bilirubin: 1.1 mg/dL (ref 0.3–1.2)
Total Protein: 7 g/dL (ref 6.5–8.1)

## 2021-04-04 LAB — CBC WITH DIFFERENTIAL/PLATELET
Abs Immature Granulocytes: 0.01 10*3/uL (ref 0.00–0.07)
Basophils Absolute: 0 10*3/uL (ref 0.0–0.1)
Basophils Relative: 0 %
Eosinophils Absolute: 0.1 10*3/uL (ref 0.0–0.5)
Eosinophils Relative: 3 %
HCT: 34.2 % — ABNORMAL LOW (ref 39.0–52.0)
Hemoglobin: 11.1 g/dL — ABNORMAL LOW (ref 13.0–17.0)
Immature Granulocytes: 0 %
Lymphocytes Relative: 28 %
Lymphs Abs: 1.2 10*3/uL (ref 0.7–4.0)
MCH: 27.6 pg (ref 26.0–34.0)
MCHC: 32.5 g/dL (ref 30.0–36.0)
MCV: 85.1 fL (ref 80.0–100.0)
Monocytes Absolute: 0.4 10*3/uL (ref 0.1–1.0)
Monocytes Relative: 8 %
Neutro Abs: 2.7 10*3/uL (ref 1.7–7.7)
Neutrophils Relative %: 61 %
Platelets: 163 10*3/uL (ref 150–400)
RBC: 4.02 MIL/uL — ABNORMAL LOW (ref 4.22–5.81)
RDW: 15.6 % — ABNORMAL HIGH (ref 11.5–15.5)
WBC: 4.5 10*3/uL (ref 4.0–10.5)
nRBC: 0 % (ref 0.0–0.2)

## 2021-04-04 MED ORDER — HEPARIN SOD (PORK) LOCK FLUSH 100 UNIT/ML IV SOLN
500.0000 [IU] | Freq: Once | INTRAVENOUS | Status: AC
  Administered 2021-04-04: 500 [IU] via INTRAVENOUS
  Filled 2021-04-04: qty 5

## 2021-04-04 MED ORDER — PROCHLORPERAZINE MALEATE 10 MG PO TABS: 10.0000 mg | ORAL_TABLET | Freq: Four times a day (QID) | ORAL | 3 refills | Status: DC | PRN

## 2021-04-04 MED ORDER — OXYCODONE HCL ER 15 MG PO T12A: 15.0000 mg | EXTENDED_RELEASE_TABLET | Freq: Two times a day (BID) | ORAL | 0 refills | Status: DC

## 2021-04-04 MED ORDER — SODIUM CHLORIDE 0.9% FLUSH
10.0000 mL | INTRAVENOUS | Status: DC | PRN
  Administered 2021-04-04: 10 mL via INTRAVENOUS
  Filled 2021-04-04: qty 10

## 2021-04-04 MED ORDER — HEPARIN SOD (PORK) LOCK FLUSH 100 UNIT/ML IV SOLN
INTRAVENOUS | Status: AC
  Filled 2021-04-04: qty 5

## 2021-04-04 NOTE — Progress Notes (Signed)
Having bilateral leg and lower back pain for a week and a half mainly at night and wants to see about increasing pain medication.

## 2021-04-04 NOTE — Assessment & Plan Note (Addendum)
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 STAGE IV-progression status post 4 cycles of FOLFIRINOX.  BRCA genetic mutation- started on Lynpzarxa on 03/12/2021.   #No overt progression of disease noted; stable.  Tolerating Lynparza fairly well.  # Back pain worse- 3/10-currently on Percocet 7.5 approximately 2 times a day. [Patient stopped MS contin [sec to intolerance-dizzy].  Again reviewed with the patient/wife the importance of long-acting pain medication- add OXYCONTIN 15 mg BID; continue percocet.  As needed.  # Diabetes-continue current medication-FBS/ PPF ~148 inclusive stable.  Awaiting nutrition evaluation today.  # Mild hypokalemia-K-3.1- Continue Dietary; STOP Kdur [? reflux]  # PATHOGENIC  BRCA- pt has 3 children [Boys-28 & 29, daugter 40] s/p genetic evaluation discussed the importance of genetic evaluation of his children/siblings.  Awaiting repeat evaluation with genetic counselor today.  # DISPOSITION:  #  follow up in 3 weeks- - MD; labs- cbc/cmp; Dr.B

## 2021-04-04 NOTE — Progress Notes (Signed)
Nutrition Follow-up:   Patient with metastatic pancreatic cancer with mets to liver.  Patient taking oral chemotherapy of olaparib.   Met with patient and wife today.  Patient reports slight nausea with new chemotherapy.  Overall good appetite and has been eating all kinds of foods.      Medications: reviewed  Labs: glucose 148  Anthropometrics:   Weight 208 lb 12.8 oz today increased  199 lb 9.6 oz on 7/21 196 lb on 6/28 197 lb on 5/31 202 lb on 5/16 213 lb on 5/2   NUTRITION DIAGNOSIS: Inadequate oral intake improved   INTERVENTION:  Patient to continue well balanced diet.  Reminded patient to limit foods that will increase blood glucose especially with good appetite and weight increasing.  Patient has contact information    MONITORING, EVALUATION, GOAL: weight trends, intake   NEXT VISIT: Wednesday, August 31 after MD visit  Charron Coultas B. Zenia Resides, [place], [place] Registered Dietitian [phone] (mobile)

## 2021-04-04 NOTE — Progress Notes (Signed)
Dan [name] CONSULT NOTE  Patient Care Team: [facility] as PCP - General Clent Jacks, RN as Oncology Nurse Navigator  CHIEF COMPLAINTS/PURPOSE OF CONSULTATION: Pancreatic cancer   Oncology History Overview Note  # April 2022- [name],  BIOPSY:  - METASTATIC PANCREATIC ACINAR CELL CARCINOMA.; STAGE IV; CT scan shows-approximately 8cm centimeter pancreatic mass; adrenal lesion; also approximately 6 cm liver lesion.   ; LIPASE NORMAL.   # April 18ht, 2022- FOLFIRINOX; udenyca x4 4 cycles-June 2022-progressive disease noted-[S/p palliative chemotherapy with FOLFIRINOX s/p 4 cyc;es-June 23rd- 2022-INCREASE in size of large pancreatic tail mass, and adjacent peripancreatic lymphadenopathy. Increased size of large complex cystic and solid mass in right hepatic lobe, consistent with hepatic metastasis. 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 Adrenal metastasis cannot be excluded]-discontinue FOLFIRINOX; declines chemotherapy-cis gem  # July 21st 2022-Lynparza 300 mg twice daily [BRCA-1 mutation-genetic mutation s/p genetic counseling ]  # July 19th 2022- Lynparza 300 mg BID   # NGS/MOLECULAR TESTS: BRCA-1*/genetic mutation  #BRCA1 genetic mutation-s/p genetic counseling [July 2022]    # PALLIATIVE CARE EVALUATION:  # PAIN MANAGEMENT:    DIAGNOSIS: Pancreatic acinar carcinoma  STAGE:  IV       ;  GOALS:pallaitive  CURRENT/MOST RECENT THERAPY :     Pancreatic cancer metastasized to liver ([place])  11/23/2020 Initial Diagnosis   Pancreatic cancer metastasized to liver ([place])   11/28/2020 Cancer Staging   Staging form: Exocrine Pancreas, AJCC 8th Edition - Clinical: Stage IV (cM1) - Signed by [name], MD on 11/28/2020 Histopathologic type: Acinar cell carcinoma   12/10/2020 -  Chemotherapy    Patient is on Treatment Plan: PANCREAS MODIFIED FOLFIRINOX Q14D X 4 CYCLES          HISTORY OF PRESENTING  ILLNESS:  Dan [name] 55 y.o.  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  Patient has been on [place] since March 12, 2021.  Mild nausea for which he takes Compazine prior.  No diarrhea.  No worsening fatigue.  No chills or fevers.  The patient to stop taking MS Contin-because of intolerance/drowsy/dizzy.  Patient is currently on Percocet 7.5 twice a day.  He states his back pain is slightly worse 3 on a scale of 10.  Otherwise denies any constipation.  He has gained few pounds of weight back.   Review of Systems  Constitutional:  Positive for malaise/fatigue. Negative for chills, diaphoresis and fever.  HENT:  Negative for nosebleeds.   Eyes:  Negative for double vision.  Respiratory:  Negative for cough, hemoptysis, sputum production, shortness of breath and wheezing.   Cardiovascular:  Negative for chest pain, palpitations, orthopnea and leg swelling.  Gastrointestinal:  Positive for abdominal pain and nausea. Negative for blood in stool, constipation, diarrhea, heartburn, melena and vomiting.  Genitourinary:  Negative for dysuria, frequency and urgency.  Musculoskeletal:  Negative for back pain and joint pain.  Skin: Negative.  Negative for itching and rash.  Neurological:  Negative for dizziness, tingling, focal weakness, weakness and headaches.  Endo/Heme/Allergies:  Does not bruise/bleed easily.  Psychiatric/Behavioral:  Negative for depression. The patient is not nervous/anxious and does not have insomnia.     MEDICAL HISTORY:  Past Medical History:  Diagnosis Date   Acid reflux    Asthma    Diabetes mellitus without complication ([place])    Family history of breast cancer    History  of kidney stones    Hypertension    Kidney stones     SURGICAL HISTORY: Past Surgical History:  Procedure Laterality Date   COLONOSCOPY WITH PROPOFOL N/A 12/02/2019   Procedure: COLONOSCOPY WITH PROPOFOL;  Surgeon:  [name], MD;  Location: [facility] ENDOSCOPY;  Service: Gastroenterology;  Laterality: N/A;   FRACTURE SURGERY Left at age 43   pelvic fracture   HEMORRHOID SURGERY N/A 01/31/2020   Procedure: HEMORRHOIDECTOMY;  Surgeon: Jules Husbands, MD;  Location: [facility] ORS;  Service: General;  Laterality: N/A;   IR IMAGING GUIDED PORT INSERTION  11/30/2020   pelvis fractur     ROTATOR CUFF REPAIR      SOCIAL HISTORY: Social History   Socioeconomic History   Marital status: Married    Spouse name: Not on file   Number of children: Not on file   Years of education: Not on file   Highest education level: Not on file  Occupational History   Not on file  Tobacco Use   Smoking status: Former    Packs/day: 0.50    Years: 25.00    Pack years: 12.50    Types: Cigarettes   Smokeless tobacco: Never  Vaping Use   Vaping Use: Never used  Substance and Sexual Activity   Alcohol use: Yes    Alcohol/week: 6.0 standard drinks    Types: 6 Cans of beer per week    Comment: weekends only   Drug use: No   Sexual activity: Yes  Other Topics Concern   Not on file  Social History Narrative   Works at [employer]. In [place]. Quit smoking 2021 June. Social. Alcohol.    Social Determinants of Health   Financial Resource Strain: Not on file  Food Insecurity: Not on file  Transportation Needs: Not on file  Physical Activity: Not on file  Stress: Not on file  Social Connections: Not on file  Intimate Partner Violence: Not on file    FAMILY HISTORY: Family History  Problem Relation Age of Onset   Alzheimer's disease Mother    Heart attack Father    Cancer Sister        2 sisters 20 and 62- both died of breast cancer   Diabetes Brother    Hypertension Brother    Asthma Son     ALLERGIES:  is allergic to [place] [aprepitant], cherry, ibuprofen, other, and tramadol.  MEDICATIONS:  Current Outpatient Medications  Medication Sig Dispense Refill   amLODipine (NORVASC) 10 MG tablet TAKE 1 TABLET(10 MG) BY MOUTH  DAILY (Patient taking differently: Take 10 mg by mouth daily.) 90 tablet 1   chlorthalidone (HYGROTON) 25 MG tablet Take 25 mg by mouth every morning.     FLOVENT HFA 110 MCG/ACT inhaler Inhale 1 puff into the lungs 2 (two) times daily.     hydrOXYzine (ATARAX/VISTARIL) 10 MG tablet Take 1 tablet (10 mg total) by mouth 3 (three) times daily as needed. For itching. 30 tablet 0   JARDIANCE 10 MG TABS tablet Take 10 mg by mouth daily.     lidocaine-prilocaine (EMLA) cream Apply 1 application topically as needed (prior to port a cath needle access on the days of chemotherapy). 30 g 3   losartan (COZAAR) 100 MG tablet TAKE 1 TABLET(100 MG) BY MOUTH DAILY (Patient taking differently: Take 100 mg by mouth daily.) 90 tablet 1   magic mouthwash w/lidocaine SOLN Take 5 mLs by mouth 4 (four) times daily. 80 ml viscous lidocaine 2%, 80 ml Mylanta, 80 ml  Diphenhydramine 12.5 mg/5 ml Elixir, 80 ml Nystatin 100,000 Unit suspension, 80 ml Prednisolone 15 mg/21ml, 80 ml Distilled Water. Sig: Swish/Swallow 5-10 ml four times a day as needed. Dispense 480 ml. 3RFs 480 mL 3   methylPREDNISolone (MEDROL DOSEPAK) 4 MG TBPK tablet Use as directed. 21 tablet 1   olaparib (LYNPARZA) 150 MG tablet Take 2 tablets (300 mg total) by mouth 2 (two) times daily. Swallow whole. May take with food to decrease nausea and vomiting. 120 tablet 1   omeprazole (PRILOSEC) 40 MG capsule Take 1 capsule (40 mg total) by mouth daily. 30 capsule 3   ondansetron (ZOFRAN) 8 MG tablet Take 1 tablet (8 mg total) by mouth every 8 (eight) hours as needed for nausea, vomiting or refractory nausea / vomiting. 20 tablet 3   oxyCODONE (OXYCONTIN) 15 mg 12 hr tablet Take 1 tablet (15 mg total) by mouth every 12 (twelve) hours. 60 tablet 0   oxyCODONE-acetaminophen (PERCOCET) 7.5-325 MG tablet Take 1 tablet by mouth every 8 (eight) hours as needed for severe pain. 60 tablet 0   potassium chloride SA (KLOR-CON) 20 MEQ tablet 1 pill twice a day 60 tablet 3    prochlorperazine (COMPAZINE) 10 MG tablet Take 1 tablet (10 mg total) by mouth every 6 (six) hours as needed for nausea or vomiting. 30 tablet 3   No current facility-administered medications for this visit.      Marland Kitchen  PHYSICAL EXAMINATION: ECOG PERFORMANCE STATUS: 1 - Symptomatic but completely ambulatory  Vitals:   04/04/21 0929  BP: 132/88  Pulse: 79  Resp: 16  Temp: 97.7 F (36.5 C)  SpO2: 100%   Filed Weights   04/04/21 0929  Weight: 208 lb 12.8 oz (94.7 kg)    Physical Exam Constitutional:      Comments: Accompanied by wife.  Ambulating independently.  HENT:     Head: Normocephalic and atraumatic.     Mouth/Throat:     Pharynx: No oropharyngeal exudate.  Eyes:     Pupils: Pupils are equal, round, and reactive to light.  Cardiovascular:     Rate and Rhythm: Normal rate and regular rhythm.  Pulmonary:     Effort: Pulmonary effort is normal. No respiratory distress.     Breath sounds: Normal breath sounds. No wheezing.  Abdominal:     General: Bowel sounds are normal. There is no distension.     Palpations: Abdomen is soft. There is no mass.     Tenderness: There is no abdominal tenderness. There is no guarding or rebound.  Musculoskeletal:        General: No tenderness. Normal range of motion.     Cervical back: Normal range of motion and neck supple.  Skin:    General: Skin is warm.  Neurological:     Mental Status: He is alert and oriented to person, place, and time.  Psychiatric:        Mood and Affect: Affect normal.     LABORATORY DATA:  I have reviewed the data as listed Lab Results  Component Value Date   WBC 4.5 04/04/2021   HGB 11.1 (L) 04/04/2021   HCT 34.2 (L) 04/04/2021   MCV 85.1 04/04/2021   PLT 163 04/04/2021   Recent Labs    05/08/20 1955 05/09/20 0921 11/19/20 0745 02/19/21 0821 03/14/21 0921 04/04/21 0902  NA 132* 135   < > 135 138 138  K 4.0 4.0   < > 3.1* 3.8 3.1*  CL 93* 95*   < >  100 101 105  CO2 28 29   < > $R'26 27 26   'CR$ GLUCOSE 592* 449*   < > 195* 182* 148*  BUN 26* 22*   < > $R'14 17 11  'lj$ CREATININE 1.14 1.07   < > 0.75 1.18 0.96  CALCIUM 9.8 9.6   < > 9.0 9.0 8.8*  GFRNONAA >60 >60   < > >60 >60 >60  GFRAA >60 >60  --   --   --   --   PROT  --   --    < > 7.0 7.2 7.0  ALBUMIN  --   --    < > 4.0 4.1 3.9  AST  --   --    < > $R'25 22 23  'rL$ ALT  --   --    < > $R'25 18 16  'OK$ ALKPHOS  --   --    < > 89 82 67  BILITOT  --   --    < > 0.9 0.8 1.1   < > = values in this interval not displayed.    RADIOGRAPHIC STUDIES: I have personally reviewed the radiological images as listed and agreed with the findings in the report. No results found.  ASSESSMENT & PLAN:   Pancreatic cancer metastasized to liver [facility]) #Pancreatic acinar cell cancer with metastasis to liver. STAGE IV-progression status post 4 cycles of FOLFIRINOX.  BRCA genetic mutation- started on Lynpzarxa on 03/12/2021.   #No overt progression of disease noted; stable.  Tolerating Lynparza fairly well.  # Back pain worse- 3/10-currently on Percocet 7.5 approximately 2 times a day. [Patient stopped MS contin [sec to intolerance-dizzy].  Again reviewed with the patient/wife the importance of long-acting pain medication- add OXYCONTIN 15 mg BID; continue percocet.  As needed.  # Diabetes-continue current medication-FBS/ PPF ~148 inclusive stable.  Awaiting nutrition evaluation today.  # Mild hypokalemia-K-3.1- Continue Dietary; STOP Kdur [? reflux]  # PATHOGENIC  BRCA- pt has 3 children [Boys-28 & 29, daugter 40] s/p genetic evaluation discussed the importance of genetic evaluation of his children/siblings.  Awaiting repeat evaluation with genetic counselor today.  # DISPOSITION:  #  follow up in 3 weeks- - MD; labs- cbc/cmp; Dr.B  All questions were answered. The patient knows to call the clinic with any problems, questions or concerns.    [name], MD 04/04/2021 11:27 PM

## 2021-04-04 NOTE — Progress Notes (Signed)
HPI:  Mr. Dan [name] was previously seen in the [place] clinic due to a personal and family history of cancer and concerns regarding a hereditary predisposition to cancer. Please refer to our prior cancer genetics clinic note for more information regarding our discussion, assessment and recommendations, at the time. Mr. Burright recent genetic test results were disclosed to him, as were recommendations warranted by these results. These results and recommendations are discussed in more detail below.  CANCER HISTORY:  Oncology History Overview Note  # April 2022- [name],  BIOPSY:  - METASTATIC PANCREATIC ACINAR CELL CARCINOMA.; STAGE IV; CT scan shows-approximately 8cm centimeter pancreatic mass; adrenal lesion; also approximately 6 cm liver lesion.   ; LIPASE NORMAL.   # April 18ht, 2022- FOLFIRINOX; udenyca x4 4 cycles-June 2022-progressive disease noted-[S/p palliative chemotherapy with FOLFIRINOX s/p 4 cyc;es-June 23rd- 2022-INCREASE in size of large pancreatic tail mass, and adjacent peripancreatic lymphadenopathy. Increased size of large complex cystic and solid mass in right hepatic lobe, consistent with hepatic metastasis. 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 Adrenal metastasis cannot be excluded]-discontinue FOLFIRINOX; declines chemotherapy-cis gem  # July 21st 2022-Lynparza 300 mg twice daily [BRCA-1 mutation-somatic versus germline/awaiting genetic] ]  # July 19th 2022- Lynparza 300 mg BID   There is sufficient material for ancillary molecular studies if needed  (A1, A3, A4, A2)      # NGS/MOLECULAR TESTS: BRCA-1*    # PALLIATIVE CARE EVALUATION:  # PAIN MANAGEMENT:    DIAGNOSIS: Pancreatic acinar carcinoma  STAGE:  IV       ;  GOALS:pallaitive  CURRENT/MOST RECENT THERAPY :     Pancreatic cancer metastasized to liver ([place])  11/23/2020 Initial Diagnosis   Pancreatic cancer metastasized to liver ([place])    11/28/2020 Cancer Staging   Staging form: Exocrine Pancreas, AJCC 8th Edition - Clinical: Stage IV (cM1) - Signed by [name], MD on 11/28/2020 Histopathologic type: Acinar cell carcinoma   12/10/2020 -  Chemotherapy    Patient is on Treatment Plan: PANCREAS MODIFIED FOLFIRINOX Q14D X 4 CYCLES         FAMILY HISTORY:  We obtained a detailed, 4-generation family history.  Significant diagnoses are listed below: Family History  Problem Relation Age of Onset   Alzheimer's disease Mother    Heart attack Father    Cancer Sister        2 sisters 27 and 63- both died of breast cancer   Diabetes Brother    Hypertension Brother    Asthma Son     Mr. Hart has 1 son, 40, and a stepdaughter and stepson. He has 6 sisters and 7 brothers. Two sisters had breast cancer, one at 61 and one at 34, both passed from this. Two brothers have passed as well.    Mr. Caperton mother died at 18 of Alzheimer's. No known cancers on this side of the family.   Mr. Benish father died at 20 of heart issues, no known cancers on this side of the family.   Mr. Mendizabal is unaware of previous family history of genetic testing for hereditary cancer risks. Patient's maternal ancestors are of Black/American [place] descent, and paternal ancestors are of [place] descent. There is no reported Ashkenazi Jewish ancestry. There is no known consanguinity.     GENETIC TEST RESULTS: Genetic testing reported out on 04/01/2021 through the [employer] CancerNext-Expanded+RNA cancer panel identified a pathogenic variant in BRCA1 called c.[id number].  The  remainder of testing was negative/normal.   The CancerNext-Expanded + RNAinsight gene panel offered by [employer] and includes sequencing and rearrangement analysis for the following 77 genes: IP, ALK, APC*, ATM*, AXIN2, BAP1, BARD1, BLM, BMPR1A, BRCA1*, BRCA2*, BRIP1*, CDC73, CDH1*,CDK4, CDKN1B, CDKN2A, CHEK2*, CTNNA1, DICER1, FANCC, FH, FLCN, GALNT12, KIF1B, LZTR1,  MAX, MEN1, MET, MLH1*, MSH2*, MSH3, MSH6*, MUTYH*, NBN, NF1*, NF2, NTHL1, PALB2*, PHOX2B, PMS2*, POT1, PRKAR1A, PTCH1, PTEN*, RAD51C*, RAD51D*,RB1, RECQL, RET, SDHA, SDHAF2, SDHB, SDHC, SDHD, SMAD4, SMARCA4, SMARCB1, SMARCE1, STK11, SUFU, TMEM127, TP53*,TSC1, TSC2, VHL and XRCC2 (sequencing and deletion/duplication); EGFR, EGLN1, HOXB13, KIT, MITF, PDGFRA, POLD1 and POLE (sequencing only); EPCAM and GREM1 (deletion/duplication only).  The test report has been scanned into EPIC and is located under the Molecular Pathology section of the Results Review tab.  A portion of the result report is included below for reference.      DISCUSSION: BRCA1   We discussed the cancers, inheritance, management asscociated with BRCA1, and the importance of telling family members about this result.   HBOC syndrome is characterized by an increased lifetime risk for generally adult-onset cancers including breast, contralateral breast, male breast, ovarian, prostate and pancreatic.   Cancers associated with BRCA1: -Breast cancer, up to 87% risk -Ovarian cancer, up to 54% risk -Pancreatic cancer, 1-3% risk -Prostate cancer, elevated -Preliminary evidence for association with melanoma  Inheritance Hereditary predisposition to cancer due to pathogenic variants in the BRCA1 gene has autosomal dominant inheritance. This means that an individual with a pathogenic variant has a 50% chance of passing the condition on to his/her offspring. Most cases are inherited from a parent, but some cases may occur spontaneously (i.e., an individual with a pathogenic variant has parents who do not have it). Identification of a pathogenic variant allows for the recognition of at-risk relatives who can pursue testing for the familial variant.   Management:   Male Breast cancer -Breast self-exam training and education starting at 65 -Annual clinical breast exam starting at 76  Prostate cancer - Consider prostate cancer screening  starting at 30   Pancreatic cancer -For individuals with a pathogenic/likely pathogenic variant in one of the pancreatic cancer susceptibility genes: -Consider pancreatic cancer screening beginning at age 65 (or 46 years younger than earliest exocrine pancreatic cancer diagnosis in the family, whichever is earlier), for individuals with exocrine pancreatic cancer in 1 or more first or second degree relatives from the same side of (or presumed to be from same side of) family as the identified pathogenic/likely pathogenic variant  Should Mr. Broadnax relatives test positive they would qualify for pancreatic cancer screening.   Melanoma -There are no specific NCCN guidelines regarding melanoma screening for individuals with BRCA mutations.  -However, sun protection is recommended, and routine skin exams by a dermatologist can be considered.     Male breast cancer -clinical breast exams every 6-12 months beginning at 66 -age 24-29: annual breast MRI screening with contrast -age 108-75: annual mammogram with consideration of tomosynthesis and breast MRI screening with contrast -For women treated for breast cancer, screening of remaining breast tissue with annual mammography and breast MRI should continue (if they have not had bilateral mastectomy) -Consider option of risk-reducing mastectomy   Ovarian cancer -Recommend risk-reducing salpingo-oopherectomy (RRSO) typically between the ages of 46-40 and upon completion of childbearing  -For patients who do not elect RRSO, transvaginal ultrasound combined with serum CA-125 for ovarian cancer screening has not been shown to be sufficiently sensitive or specific as to support a positive recommendation, but,  although of uncertain benefit, may be considered at clinician's discretion starting at age 67-35 years   These guidelines are based on current NCCN guidelines (NCCN v.1.2022).  These guidelines are subject to change and continually updated and should  be directly referenced for future medical management.     FAMILY MEMBERS: It is important that all of Mr. Hazen relatives (both men and women) know of the presence of this gene mutation. Site-specific genetic testing can sort out who in the family is at risk and who is not.   Mr. Mcmartin son and siblings have a 50% chance to have inherited this mutation. We recommend they have genetic testing for this same mutation, as identifying the presence of this mutation would allow them to also take advantage of risk-reducing measures.   PLAN:   1. These results will be made available to his care team. He would like Dr. Rogue Bussing to follow him long-term for this indication and coordinate screening/prophylactic surgeries.    2. Mr. Stecklein plans to discuss these results with his family and will reach out to [place] if we can be of any assistance in coordinating genetic testing for any relatives.    SUPPORT AND RESOURCES: If Mr. Bramhall is interested in BRCA-specific information and support, there are two groups, Facing Our Risk (www.facingourrisk.com) and Bright Pink (www.brightpink.org) which some people have found useful. They provide opportunities to speak with other individuals from high-risk families. To locate genetic counselors in other cities, visit the website of the [employer] (ArtistMovie.se) and Secretary/administrator for a Social worker by zip code.  We encouraged Mr. Obar to remain in contact with [place] on an annual basis so we can update his personal and family histories, and let him know of advances in cancer genetics that may benefit the family. Our contact number was provided. Mr. Halter questions were answered to his satisfaction today, and he knows he is welcome to call anytime with additional questions.   Faith Rogue, MS, [facility] Genetic Counselor [email or web address] Phone: [phone]

## 2021-04-10 ENCOUNTER — Other Ambulatory Visit: Payer: Self-pay | Admitting: *Deleted

## 2021-04-10 NOTE — Telephone Encounter (Signed)
Pt requesting percocet RF

## 2021-04-11 ENCOUNTER — Encounter: Payer: Self-pay | Admitting: Internal Medicine

## 2021-04-11 MED ORDER — OXYCODONE-ACETAMINOPHEN 7.5-325 MG PO TABS: 1.0000 | ORAL_TABLET | Freq: Three times a day (TID) | ORAL | 0 refills | Status: DC | PRN

## 2021-04-15 ENCOUNTER — Other Ambulatory Visit: Payer: Self-pay | Admitting: Internal Medicine

## 2021-04-15 DIAGNOSIS — C259 Malignant neoplasm of pancreas, unspecified: Secondary | ICD-10-CM

## 2021-04-15 DIAGNOSIS — C787 Secondary malignant neoplasm of liver and intrahepatic bile duct: Secondary | ICD-10-CM

## 2021-04-24 ENCOUNTER — Inpatient Hospital Stay (HOSPITAL_BASED_OUTPATIENT_CLINIC_OR_DEPARTMENT_OTHER): Payer: BLUE CROSS/BLUE SHIELD | Admitting: Internal Medicine

## 2021-04-24 ENCOUNTER — Inpatient Hospital Stay: Payer: BLUE CROSS/BLUE SHIELD

## 2021-04-24 ENCOUNTER — Encounter: Payer: Self-pay | Admitting: Internal Medicine

## 2021-04-24 VITALS — BP 130/89 | HR 74 | Temp 98.4°F | Resp 16 | Ht 72.0 in | Wt 208.0 lb

## 2021-04-24 DIAGNOSIS — C259 Malignant neoplasm of pancreas, unspecified: Secondary | ICD-10-CM

## 2021-04-24 DIAGNOSIS — C787 Secondary malignant neoplasm of liver and intrahepatic bile duct: Secondary | ICD-10-CM | POA: Diagnosis not present

## 2021-04-24 DIAGNOSIS — C252 Malignant neoplasm of tail of pancreas: Secondary | ICD-10-CM | POA: Diagnosis not present

## 2021-04-24 LAB — CBC WITH DIFFERENTIAL/PLATELET
Abs Immature Granulocytes: 0.01 10*3/uL (ref 0.00–0.07)
Basophils Absolute: 0 10*3/uL (ref 0.0–0.1)
Basophils Relative: 0 %
Eosinophils Absolute: 0.2 10*3/uL (ref 0.0–0.5)
Eosinophils Relative: 5 %
HCT: 38.4 % — ABNORMAL LOW (ref 39.0–52.0)
Hemoglobin: 12.4 g/dL — ABNORMAL LOW (ref 13.0–17.0)
Immature Granulocytes: 0 %
Lymphocytes Relative: 30 %
Lymphs Abs: 1.4 10*3/uL (ref 0.7–4.0)
MCH: 27.9 pg (ref 26.0–34.0)
MCHC: 32.3 g/dL (ref 30.0–36.0)
MCV: 86.5 fL (ref 80.0–100.0)
Monocytes Absolute: 0.5 10*3/uL (ref 0.1–1.0)
Monocytes Relative: 10 %
Neutro Abs: 2.6 10*3/uL (ref 1.7–7.7)
Neutrophils Relative %: 55 %
Platelets: 190 10*3/uL (ref 150–400)
RBC: 4.44 MIL/uL (ref 4.22–5.81)
RDW: 17.8 % — ABNORMAL HIGH (ref 11.5–15.5)
WBC: 4.7 10*3/uL (ref 4.0–10.5)
nRBC: 0 % (ref 0.0–0.2)

## 2021-04-24 LAB — COMPREHENSIVE METABOLIC PANEL
ALT: 21 U/L (ref 0–44)
AST: 25 U/L (ref 15–41)
Albumin: 4.2 g/dL (ref 3.5–5.0)
Alkaline Phosphatase: 67 U/L (ref 38–126)
Anion gap: 8 (ref 5–15)
BUN: 12 mg/dL (ref 6–20)
CO2: 25 mmol/L (ref 22–32)
Calcium: 8.8 mg/dL — ABNORMAL LOW (ref 8.9–10.3)
Chloride: 103 mmol/L (ref 98–111)
Creatinine, Ser: 1.12 mg/dL (ref 0.61–1.24)
GFR, Estimated: >60 mL/min (ref >60)
Glucose, Bld: 133 mg/dL — ABNORMAL HIGH (ref 70–99)
Potassium: 3.7 mmol/L (ref 3.5–5.1)
Sodium: 136 mmol/L (ref 135–145)
Total Bilirubin: 0.9 mg/dL (ref 0.3–1.2)
Total Protein: 7.4 g/dL (ref 6.5–8.1)

## 2021-04-24 NOTE — Assessment & Plan Note (Signed)
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 STAGE IV-progression status post 4 cycles of FOLFIRINOX.  BRCA genetic mutation- Currently Lynpzarxa on 03/12/2021.   # No overt progression of disease noted; stable.  Tolerating Lynparza fairly well.will plan Imaging in 1 month/ordered today.  # Back pain- sec to malignancy/ Percocet 7.5 approximately 2 times a day. [Patient stopped MS contin [sec to intolerance-dizzy].   CONTINUE OXYCONTIN 15 mg BID; continue percocet- STABLE.   # Diabetes-continue current medication-FBS/ PPF ~133; STABLE.  # Mild hypokalemia-K-3.7- Continue Dietary; STOPPED Kdur; STABLE.   # Parking pass: will provide  # IV ACCESS: no malfunction- port flush- STABLE  # PATHOGENIC  BRCA- pt has 3 children [Boys-28 & 29, daugter 40] s/p genetic evaluation. Discussed   # DISPOSITION:  #  follow up in 5 weeks- - MD; labs- cbc/cmp; CT C/A/P- Dr.B

## 2021-04-24 NOTE — Progress Notes (Signed)
Handicap parking pass

## 2021-04-24 NOTE — Progress Notes (Signed)
[facility] CONSULT NOTE  Patient Care Team: [facility] as PCP - General Benita Gutter, RN as Oncology Nurse Navigator  CHIEF COMPLAINTS/PURPOSE OF CONSULTATION: Pancreatic cancer   Oncology History Overview Note  # April 2022- [name],  BIOPSY:  - METASTATIC PANCREATIC ACINAR CELL CARCINOMA.; STAGE IV; CT scan shows-approximately 8cm centimeter pancreatic mass; adrenal lesion; also approximately 6 cm liver lesion.   ; LIPASE NORMAL.   # April 18ht, 2022- FOLFIRINOX; udenyca x4 4 cycles-June 2022-progressive disease noted-[S/p palliative chemotherapy with FOLFIRINOX s/p 4 cyc;es-June 23rd- 2022-INCREASE in size of large pancreatic tail mass, and adjacent peripancreatic lymphadenopathy. Increased size of large complex cystic and solid mass in right hepatic lobe, consistent with hepatic metastasis. 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 Adrenal metastasis cannot be excluded]-discontinue FOLFIRINOX; declines chemotherapy-cis gem  # July 21st 2022-Lynparza 300 mg twice daily [BRCA-1 mutation-genetic mutation s/p genetic counseling ]  # July 19th 2022- Lynparza 300 mg BID   # NGS/MOLECULAR TESTS: BRCA-1*/genetic mutation  #BRCA1 genetic mutation-s/p genetic counseling [July 2022]    # PALLIATIVE CARE EVALUATION:  # PAIN MANAGEMENT:    DIAGNOSIS: Pancreatic acinar carcinoma  STAGE:  IV       ;  GOALS:pallaitive  CURRENT/MOST RECENT THERAPY :     Pancreatic cancer metastasized to liver (HCC)  11/23/2020 Initial Diagnosis   Pancreatic cancer metastasized to liver (HCC)   11/28/2020 Cancer Staging   Staging form: Exocrine Pancreas, AJCC 8th Edition - Clinical: Stage IV (cM1) - Signed by Earna Coder, MD on 11/28/2020 Histopathologic type: Acinar cell carcinoma   12/10/2020 -  Chemotherapy    Patient is on Treatment Plan: PANCREAS MODIFIED FOLFIRINOX Q14D X 4 CYCLES          HISTORY OF PRESENTING  ILLNESS:  Accompanied by wife.  Ambulating independently.  Dan [name] 55 y.o.  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  Patient denies any nausea vomiting abdominal pain.  Continues to have chronic back pain is currently stable on narcotic pain medication.  No constipation.  Review of Systems  Constitutional:  Positive for malaise/fatigue. Negative for chills, diaphoresis and fever.  HENT:  Negative for nosebleeds.   Eyes:  Negative for double vision.  Respiratory:  Negative for cough, hemoptysis, sputum production, shortness of breath and wheezing.   Cardiovascular:  Negative for chest pain, palpitations, orthopnea and leg swelling.  Gastrointestinal:  Negative for blood in stool, constipation, diarrhea, heartburn, melena and vomiting.  Genitourinary:  Negative for dysuria, frequency and urgency.  Musculoskeletal:  Negative for back pain and joint pain.  Skin: Negative.  Negative for itching and rash.  Neurological:  Negative for dizziness, tingling, focal weakness, weakness and headaches.  Endo/Heme/Allergies:  Does not bruise/bleed easily.  Psychiatric/Behavioral:  Negative for depression. The patient is not nervous/anxious and does not have insomnia.     MEDICAL HISTORY:  Past Medical History:  Diagnosis Date   Acid reflux    Asthma    Diabetes mellitus without complication (HCC)    Family history of breast cancer    History of kidney stones    Hypertension    Kidney stones     SURGICAL HISTORY: Past Surgical History:  Procedure Laterality Date   COLONOSCOPY WITH PROPOFOL N/A 12/02/2019   Procedure: COLONOSCOPY WITH PROPOFOL;  Surgeon: Wyline Mood, MD;  Location: [facility] ENDOSCOPY;  Service: Gastroenterology;  Laterality: N/A;  FRACTURE SURGERY Left at age 77   pelvic fracture   HEMORRHOID SURGERY N/A 01/31/2020   Procedure: HEMORRHOIDECTOMY;  Surgeon: Jules Husbands, MD;  Location: [facility] ORS;   Service: General;  Laterality: N/A;   IR IMAGING GUIDED PORT INSERTION  11/30/2020   pelvis fractur     ROTATOR CUFF REPAIR      SOCIAL HISTORY: Social History   Socioeconomic History   Marital status: Married    Spouse name: Not on file   Number of children: Not on file   Years of education: Not on file   Highest education level: Not on file  Occupational History   Not on file  Tobacco Use   Smoking status: Former    Packs/day: 0.50    Years: 25.00    Pack years: 12.50    Types: Cigarettes   Smokeless tobacco: Never  Vaping Use   Vaping Use: Never used  Substance and Sexual Activity   Alcohol use: Yes    Alcohol/week: 6.0 standard drinks    Types: 6 Cans of beer per week    Comment: weekends only   Drug use: No   Sexual activity: Yes  Other Topics Concern   Not on file  Social History Narrative   Works at [employer]. In [place]. Quit smoking 2021 June. Social. Alcohol.    Social Determinants of Health   Financial Resource Strain: Not on file  Food Insecurity: Not on file  Transportation Needs: Not on file  Physical Activity: Not on file  Stress: Not on file  Social Connections: Not on file  Intimate Partner Violence: Not on file    FAMILY HISTORY: Family History  Problem Relation Age of Onset   Alzheimer's disease Mother    Heart attack Father    Cancer Sister        2 sisters 24 and 15- both died of breast cancer   Diabetes Brother    Hypertension Brother    Asthma Son     ALLERGIES:  is allergic to [place] [aprepitant], cherry, ibuprofen, other, and tramadol.  MEDICATIONS:  Current Outpatient Medications  Medication Sig Dispense Refill   amLODipine (NORVASC) 10 MG tablet TAKE 1 TABLET(10 MG) BY MOUTH DAILY (Patient taking differently: Take 10 mg by mouth daily.) 90 tablet 1   chlorthalidone (HYGROTON) 25 MG tablet Take 25 mg by mouth every morning.     FLOVENT HFA 110 MCG/ACT inhaler Inhale 1 puff into the lungs 2 (two) times daily.     hydrOXYzine  (ATARAX/VISTARIL) 10 MG tablet Take 1 tablet (10 mg total) by mouth 3 (three) times daily as needed. For itching. 30 tablet 0   JARDIANCE 10 MG TABS tablet Take 10 mg by mouth daily.     lidocaine-prilocaine (EMLA) cream Apply 1 application topically as needed (prior to port a cath needle access on the days of chemotherapy). 30 g 3   losartan (COZAAR) 100 MG tablet TAKE 1 TABLET(100 MG) BY MOUTH DAILY (Patient taking differently: Take 100 mg by mouth daily.) 90 tablet 1   LYNPARZA 150 MG tablet TAKE 2 TABLETS BY MOUTH 2 TIMES A DAY. MAY TAKE WITH FOOD TO DECREASE NAUSEA AND VOMITING. SWALLOW TABLETS WHOLE. 120 tablet 0   magic mouthwash w/lidocaine SOLN Take 5 mLs by mouth 4 (four) times daily. 80 ml viscous lidocaine 2%, 80 ml Mylanta, 80 ml Diphenhydramine 12.5 mg/5 ml Elixir, 80 ml Nystatin 100,000 Unit suspension, 80 ml Prednisolone 15 mg/22ml, 80 ml Distilled Water. Sig: Swish/Swallow 5-10 ml four  times a day as needed. Dispense 480 ml. 3RFs 480 mL 3   methylPREDNISolone (MEDROL DOSEPAK) 4 MG TBPK tablet Use as directed. 21 tablet 1   omeprazole (PRILOSEC) 40 MG capsule Take 1 capsule (40 mg total) by mouth daily. 30 capsule 3   ondansetron (ZOFRAN) 8 MG tablet Take 1 tablet (8 mg total) by mouth every 8 (eight) hours as needed for nausea, vomiting or refractory nausea / vomiting. 20 tablet 3   oxyCODONE (OXYCONTIN) 15 mg 12 hr tablet Take 1 tablet (15 mg total) by mouth every 12 (twelve) hours. 60 tablet 0   oxyCODONE-acetaminophen (PERCOCET) 7.5-325 MG tablet Take 1 tablet by mouth every 8 (eight) hours as needed for severe pain. 60 tablet 0   potassium chloride SA (KLOR-CON) 20 MEQ tablet 1 pill twice a day 60 tablet 3   prochlorperazine (COMPAZINE) 10 MG tablet Take 1 tablet (10 mg total) by mouth every 6 (six) hours as needed for nausea or vomiting. 30 tablet 3   hydrOXYzine (VISTARIL) 25 MG capsule Take 25 mg by mouth daily as needed.     No current facility-administered medications for  this visit.      Marland Kitchen  PHYSICAL EXAMINATION: ECOG PERFORMANCE STATUS: 1 - Symptomatic but completely ambulatory  Vitals:   04/24/21 1001  BP: 130/89  Pulse: 74  Resp: 16  Temp: 98.4 F (36.9 C)  SpO2: 100%   Filed Weights   04/24/21 1001  Weight: 208 lb (94.3 kg)    Physical Exam HENT:     Head: Normocephalic and atraumatic.     Mouth/Throat:     Pharynx: No oropharyngeal exudate.  Eyes:     Pupils: Pupils are equal, round, and reactive to light.  Cardiovascular:     Rate and Rhythm: Normal rate and regular rhythm.  Pulmonary:     Effort: Pulmonary effort is normal. No respiratory distress.     Breath sounds: Normal breath sounds. No wheezing.  Abdominal:     General: Bowel sounds are normal. There is no distension.     Palpations: Abdomen is soft. There is no mass.     Tenderness: There is no abdominal tenderness. There is no guarding or rebound.  Musculoskeletal:        General: No tenderness. Normal range of motion.     Cervical back: Normal range of motion and neck supple.  Skin:    General: Skin is warm.  Neurological:     Mental Status: He is alert and oriented to person, place, and time.  Psychiatric:        Mood and Affect: Affect normal.     LABORATORY DATA:  I have reviewed the data as listed Lab Results  Component Value Date   WBC 4.7 04/24/2021   HGB 12.4 (L) 04/24/2021   HCT 38.4 (L) 04/24/2021   MCV 86.5 04/24/2021   PLT 190 04/24/2021   Recent Labs    05/08/20 1955 05/09/20 0921 11/19/20 0745 03/14/21 0921 04/04/21 0902 04/24/21 0946  NA 132* 135   < > 138 138 136  K 4.0 4.0   < > 3.8 3.1* 3.7  CL 93* 95*   < > 101 105 103  CO2 28 29   < > $R'27 26 25  'QA$ GLUCOSE 592* 449*   < > 182* 148* 133*  BUN 26* 22*   < > $R'17 11 12  'QO$ CREATININE 1.14 1.07   < > 1.18 0.96 1.12  CALCIUM 9.8 9.6   < > 9.0  8.8* 8.8*  GFRNONAA >60 >60   < > >60 >60 >60  GFRAA >60 >60  --   --   --   --   PROT  --   --    < > 7.2 7.0 7.4  ALBUMIN  --   --    < > 4.1  3.9 4.2  AST  --   --    < > $R'22 23 25  'wN$ ALT  --   --    < > $R'18 16 21  'Bu$ ALKPHOS  --   --    < > 82 67 67  BILITOT  --   --    < > 0.8 1.1 0.9   < > = values in this interval not displayed.    RADIOGRAPHIC STUDIES: I have personally reviewed the radiological images as listed and agreed with the findings in the report. No results found.  ASSESSMENT & PLAN:   Pancreatic cancer metastasized to liver [facility]) #Pancreatic acinar cell cancer with metastasis to liver. STAGE IV-progression status post 4 cycles of FOLFIRINOX.  BRCA genetic mutation- Currently Lynpzarxa on 03/12/2021.   # No overt progression of disease noted; stable.  Tolerating Lynparza fairly well.will plan Imaging in 1 month/ordered today.  # Back pain- sec to malignancy/ Percocet 7.5 approximately 2 times a day. [Patient stopped MS contin [sec to intolerance-dizzy].   CONTINUE OXYCONTIN 15 mg BID; continue percocet- STABLE.   # Diabetes-continue current medication-FBS/ PPF ~133; STABLE.  # Mild hypokalemia-K-3.7- Continue Dietary; STOPPED Kdur; STABLE.   # Parking pass: will provide  # IV ACCESS: no malfunction- port flush- STABLE  # PATHOGENIC  BRCA- pt has 3 children [Boys-28 & 29, daugter 40] s/p genetic evaluation. Discussed   # DISPOSITION:  #  follow up in 5 weeks- - MD; labs- cbc/cmp; CT C/A/P- Dr.B  All questions were answered. The patient knows to call the clinic with any problems, questions or concerns.    [name], MD 04/24/2021 12:03 PM

## 2021-05-06 ENCOUNTER — Other Ambulatory Visit: Payer: Self-pay | Admitting: *Deleted

## 2021-05-07 ENCOUNTER — Encounter: Payer: Self-pay | Admitting: Internal Medicine

## 2021-05-07 MED ORDER — OXYCODONE HCL ER 15 MG PO T12A: 15.0000 mg | EXTENDED_RELEASE_TABLET | Freq: Two times a day (BID) | ORAL | 0 refills | Status: DC

## 2021-05-07 MED ORDER — OXYCODONE-ACETAMINOPHEN 7.5-325 MG PO TABS: 1.0000 | ORAL_TABLET | Freq: Three times a day (TID) | ORAL | 0 refills | Status: DC | PRN

## 2021-05-08 ENCOUNTER — Telehealth: Payer: Self-pay | Admitting: Pharmacy Technician

## 2021-05-09 NOTE — Telephone Encounter (Signed)
Oral Oncology Patient Advocate Encounter   Received notification from [employer] that the existing prior authorization for [name] is due for renewal.   Renewal PA faxed to [phone] on 05/08/21.   Oral Oncology Clinic will continue to follow.  [place] [facility] [place] Phone [phone] Fax [phone] 05/09/2021 8:14 AM

## 2021-05-09 NOTE — Telephone Encounter (Signed)
Oral Oncology Patient Advocate Encounter  Prior Authorization for [name] has been approved.    PA# [id number] Effective dates: 05/08/21 through 05/08/22  Oral Oncology Clinic will continue to follow.   Dan [name] Phone [phone] Fax [phone] 05/09/2021 8:15 AM

## 2021-05-27 ENCOUNTER — Other Ambulatory Visit: Payer: Self-pay

## 2021-05-27 ENCOUNTER — Ambulatory Visit
Admission: RE | Admit: 2021-05-27 | Discharge: 2021-05-27 | Disposition: A | Discharge status: Home / Self Care | Payer: BLUE CROSS/BLUE SHIELD | Source: Ambulatory Visit | Attending: Internal Medicine | Admitting: Internal Medicine

## 2021-05-27 DIAGNOSIS — C787 Secondary malignant neoplasm of liver and intrahepatic bile duct: Secondary | ICD-10-CM | POA: Insufficient documentation

## 2021-05-27 DIAGNOSIS — C259 Malignant neoplasm of pancreas, unspecified: Secondary | ICD-10-CM | POA: Insufficient documentation

## 2021-05-27 LAB — POCT I-STAT CREATININE: Creatinine, Ser: 1 mg/dL (ref 0.61–1.24)

## 2021-05-27 MED ORDER — IOHEXOL 350 MG/ML SOLN
100.0000 mL | Freq: Once | INTRAVENOUS | Status: AC | PRN
  Administered 2021-05-27: 100 mL via INTRAVENOUS

## 2021-05-29 ENCOUNTER — Inpatient Hospital Stay: Payer: BLUE CROSS/BLUE SHIELD | Attending: Internal Medicine

## 2021-05-29 ENCOUNTER — Other Ambulatory Visit: Payer: Self-pay

## 2021-05-29 ENCOUNTER — Inpatient Hospital Stay (HOSPITAL_BASED_OUTPATIENT_CLINIC_OR_DEPARTMENT_OTHER): Payer: BLUE CROSS/BLUE SHIELD | Admitting: Internal Medicine

## 2021-05-29 DIAGNOSIS — C259 Malignant neoplasm of pancreas, unspecified: Secondary | ICD-10-CM

## 2021-05-29 DIAGNOSIS — C252 Malignant neoplasm of tail of pancreas: Secondary | ICD-10-CM | POA: Insufficient documentation

## 2021-05-29 DIAGNOSIS — E119 Type 2 diabetes mellitus without complications: Secondary | ICD-10-CM | POA: Insufficient documentation

## 2021-05-29 DIAGNOSIS — C787 Secondary malignant neoplasm of liver and intrahepatic bile duct: Secondary | ICD-10-CM

## 2021-05-29 DIAGNOSIS — E876 Hypokalemia: Secondary | ICD-10-CM | POA: Diagnosis not present

## 2021-05-29 DIAGNOSIS — Z452 Encounter for adjustment and management of vascular access device: Secondary | ICD-10-CM | POA: Insufficient documentation

## 2021-05-29 LAB — CBC WITH DIFFERENTIAL/PLATELET
Abs Immature Granulocytes: 0.02 10*3/uL (ref 0.00–0.07)
Basophils Absolute: 0 10*3/uL (ref 0.0–0.1)
Basophils Relative: 1 %
Eosinophils Absolute: 0.2 10*3/uL (ref 0.0–0.5)
Eosinophils Relative: 5 %
HCT: 37.1 % — ABNORMAL LOW (ref 39.0–52.0)
Hemoglobin: 12.6 g/dL — ABNORMAL LOW (ref 13.0–17.0)
Immature Granulocytes: 0 %
Lymphocytes Relative: 40 %
Lymphs Abs: 1.9 10*3/uL (ref 0.7–4.0)
MCH: 29.6 pg (ref 26.0–34.0)
MCHC: 34 g/dL (ref 30.0–36.0)
MCV: 87.1 fL (ref 80.0–100.0)
Monocytes Absolute: 0.5 10*3/uL (ref 0.1–1.0)
Monocytes Relative: 10 %
Neutro Abs: 2.2 10*3/uL (ref 1.7–7.7)
Neutrophils Relative %: 44 %
Platelets: 180 10*3/uL (ref 150–400)
RBC: 4.26 MIL/uL (ref 4.22–5.81)
RDW: 19.3 % — ABNORMAL HIGH (ref 11.5–15.5)
WBC: 4.9 10*3/uL (ref 4.0–10.5)
nRBC: 0 % (ref 0.0–0.2)

## 2021-05-29 LAB — COMPREHENSIVE METABOLIC PANEL
ALT: 24 U/L (ref 0–44)
AST: 27 U/L (ref 15–41)
Albumin: 4.3 g/dL (ref 3.5–5.0)
Alkaline Phosphatase: 65 U/L (ref 38–126)
Anion gap: 7 (ref 5–15)
BUN: 15 mg/dL (ref 6–20)
CO2: 27 mmol/L (ref 22–32)
Calcium: 9.1 mg/dL (ref 8.9–10.3)
Chloride: 101 mmol/L (ref 98–111)
Creatinine, Ser: 1.09 mg/dL (ref 0.61–1.24)
GFR, Estimated: >60 mL/min (ref >60)
Glucose, Bld: 150 mg/dL — ABNORMAL HIGH (ref 70–99)
Potassium: 4 mmol/L (ref 3.5–5.1)
Sodium: 135 mmol/L (ref 135–145)
Total Bilirubin: 0.9 mg/dL (ref 0.3–1.2)
Total Protein: 7.7 g/dL (ref 6.5–8.1)

## 2021-05-29 MED ORDER — OXYCODONE-ACETAMINOPHEN 7.5-325 MG PO TABS: 1.0000 | ORAL_TABLET | Freq: Three times a day (TID) | ORAL | 0 refills | Status: DC | PRN

## 2021-05-29 MED ORDER — OLAPARIB 150 MG PO TABS: ORAL_TABLET | ORAL | 0 refills | Status: DC

## 2021-05-29 MED ORDER — SODIUM CHLORIDE 0.9% FLUSH
10.0000 mL | Freq: Once | INTRAVENOUS | Status: AC
  Administered 2021-05-29: 10 mL via INTRAVENOUS
  Filled 2021-05-29: qty 10

## 2021-05-29 MED ORDER — HEPARIN SOD (PORK) LOCK FLUSH 100 UNIT/ML IV SOLN
500.0000 [IU] | Freq: Once | INTRAVENOUS | Status: AC
  Administered 2021-05-29: 500 [IU]
  Filled 2021-05-29: qty 5

## 2021-05-29 MED ORDER — OXYCODONE HCL ER 15 MG PO T12A: 15.0000 mg | EXTENDED_RELEASE_TABLET | Freq: Two times a day (BID) | ORAL | 0 refills | Status: DC

## 2021-05-29 NOTE — Progress Notes (Signed)
**Dan Dan** Dan Dan  Patient Care Team: [facility] as PCP - General Dan Jacks, RN as Oncology Nurse Navigator  CHIEF COMPLAINTS/PURPOSE OF CONSULTATION: Pancreatic cancer   Oncology History Overview Dan  # April 2022- [name],  BIOPSY:  - METASTATIC PANCREATIC ACINAR CELL CARCINOMA.; STAGE IV; CT scan shows-approximately 8cm centimeter pancreatic mass; adrenal lesion; also approximately 6 cm liver lesion.   ; LIPASE NORMAL.   # April 18ht, 2022- FOLFIRINOX; udenyca x4 4 cycles-June 2022-progressive disease noted-[S/p palliative chemotherapy with FOLFIRINOX s/p 4 cyc;es-June 23rd- 2022-INCREASE in size of large pancreatic tail mass, and adjacent peripancreatic lymphadenopathy. Increased size of large complex cystic and solid mass in right hepatic lobe, consistent with hepatic metastasis. 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 Adrenal metastasis cannot be excluded]-discontinue FOLFIRINOX; declines chemotherapy-cis gem  # July 21st 2022-Lynparza 300 mg twice daily [BRCA-1 mutation-genetic mutation s/p genetic counseling ]  # July 19th 2022- Lynparza 300 mg BID   # NGS/MOLECULAR TESTS: BRCA-1*/genetic mutation  #BRCA1 genetic mutation-s/p genetic counseling [July 2022]    # PALLIATIVE CARE EVALUATION:  # PAIN MANAGEMENT:    DIAGNOSIS: Pancreatic acinar carcinoma  STAGE:  IV       ;  GOALS:pallaitive  CURRENT/MOST RECENT THERAPY :     Pancreatic cancer metastasized to liver ([place])  11/23/2020 Initial Diagnosis   Pancreatic cancer metastasized to liver ([place])   11/28/2020 Cancer Staging   Staging form: Exocrine Pancreas, AJCC 8th Edition - Clinical: Stage IV (cM1) - Signed by Dan Sickle, MD on 11/28/2020 Histopathologic type: Acinar cell carcinoma   12/10/2020 -  Chemotherapy    Patient is on Treatment Plan: PANCREAS MODIFIED FOLFIRINOX Q14D X 4 CYCLES          HISTORY OF PRESENTING  ILLNESS:  Accompanied by wife.  Ambulating independently.  Dan Dan 55 y.o.  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  Mild to moderate fatigue.  Chronic back pain for which he is on narcotic pain medication.  No abdominal pain no nausea no vomiting.  No worsening constipation. .  Review of Systems  Constitutional:  Positive for malaise/fatigue. Negative for chills, diaphoresis and fever.  HENT:  Negative for nosebleeds.   Eyes:  Negative for double vision.  Respiratory:  Negative for cough, hemoptysis, sputum production, shortness of breath and wheezing.   Cardiovascular:  Negative for chest pain, palpitations, orthopnea and leg swelling.  Gastrointestinal:  Negative for blood in stool, constipation, diarrhea, heartburn, melena and vomiting.  Genitourinary:  Negative for dysuria, frequency and urgency.  Musculoskeletal:  Negative for back pain and joint pain.  Skin: Negative.  Negative for itching and rash.  Neurological:  Negative for dizziness, tingling, focal weakness, weakness and headaches.  Endo/Heme/Allergies:  Does not bruise/bleed easily.  Psychiatric/Behavioral:  Negative for depression. The patient is not nervous/anxious and does not have insomnia.     MEDICAL HISTORY:  Past Medical History:  Diagnosis Date   Acid reflux    Asthma    Diabetes mellitus without complication (Dan Dan)    Family history of breast cancer    History of kidney stones    Hypertension    Kidney stones     SURGICAL HISTORY: Past Surgical History:  Procedure Laterality Date   COLONOSCOPY WITH PROPOFOL N/A 12/02/2019   Procedure: COLONOSCOPY WITH PROPOFOL;  Surgeon: [name], MD;  Location:  [facility];  Service: Gastroenterology;  Laterality: N/A;   FRACTURE SURGERY Left at age 49   pelvic fracture   HEMORRHOID SURGERY N/A 01/31/2020   Procedure: HEMORRHOIDECTOMY;  Surgeon: Jules Husbands, MD;  Location: [facility] ORS;  Service: General;  Laterality: N/A;   IR IMAGING GUIDED PORT INSERTION  11/30/2020   pelvis fractur     ROTATOR CUFF REPAIR      SOCIAL HISTORY: Social History   Socioeconomic History   Marital status: Married    Spouse name: Not on file   Number of children: Not on file   Years of education: Not on file   Highest education level: Not on file  Occupational History   Not on file  Tobacco Use   Smoking status: Former    Packs/day: 0.50    Years: 25.00    Pack years: 12.50    Types: Cigarettes   Smokeless tobacco: Never  Vaping Use   Vaping Use: Never used  Substance and Sexual Activity   Alcohol use: Yes    Alcohol/week: 6.0 standard drinks    Types: 6 Cans of beer per week    Comment: weekends only   Drug use: No   Sexual activity: Yes  Other Topics Concern   Not on file  Social History Narrative   Works at [employer]. In [place]. Quit smoking 2021 June. Social. Alcohol.    Social Determinants of Health   Financial Resource Strain: Not on file  Food Insecurity: Not on file  Transportation Needs: Not on file  Physical Activity: Not on file  Stress: Not on file  Social Connections: Not on file  Intimate Partner Violence: Not on file    FAMILY HISTORY: Family History  Problem Relation Age of Onset   Alzheimer's disease Mother    Heart attack Father    Cancer Sister        2 sisters 33 and 21- both died of breast cancer   Diabetes Brother    Hypertension Brother    Asthma Son     ALLERGIES:  is allergic to Dan [name] [aprepitant], cherry, ibuprofen, other, and tramadol.  MEDICATIONS:  Current Outpatient Medications  Medication Sig Dispense Refill   amitriptyline (ELAVIL) 10 MG tablet Take 10 mg by mouth at bedtime.     amLODipine (NORVASC) 10 MG tablet TAKE 1 TABLET(10 MG) BY MOUTH DAILY (Patient taking differently: Take 10 mg by mouth daily.) 90 tablet 1   chlorthalidone (HYGROTON) 25 MG tablet Take 25 mg by mouth every morning.      FLOVENT HFA 110 MCG/ACT inhaler Inhale 1 puff into the lungs 2 (two) times daily.     JARDIANCE 10 MG TABS tablet Take 10 mg by mouth daily.     lidocaine-prilocaine (EMLA) cream Apply 1 application topically as needed (prior to port a cath needle access on the days of chemotherapy). 30 g 3   losartan (COZAAR) 100 MG tablet Take by mouth.     magic mouthwash w/lidocaine SOLN Take 5 mLs by mouth 4 (four) times daily. 80 ml viscous lidocaine 2%, 80 ml Mylanta, 80 ml Diphenhydramine 12.5 mg/5 ml Elixir, 80 ml Nystatin 100,000 Unit suspension, 80 ml Prednisolone 15 mg/56m, 80 ml Distilled Water. Sig: Swish/Swallow 5-10 ml four times a day as needed. Dispense 480 ml. 3RFs 480 mL 3   methylPREDNISolone (MEDROL DOSEPAK) 4 MG TBPK tablet Use as directed. 21 tablet 1   nystatin (MYCOSTATIN) 100000 UNIT/ML suspension Take by mouth.     omeprazole (PRILOSEC) 20  MG capsule Take 20 mg by mouth 2 (two) times daily.     omeprazole (PRILOSEC) 40 MG capsule Take 1 capsule (40 mg total) by mouth daily. 30 capsule 3   ondansetron (ZOFRAN) 8 MG tablet Take 1 tablet (8 mg total) by mouth every 8 (eight) hours as needed for nausea, vomiting or refractory nausea / vomiting. 20 tablet 3   potassium chloride SA (KLOR-CON) 20 MEQ tablet 1 pill twice a day 60 tablet 3   sildenafil (VIAGRA) 100 MG tablet Take 100 mg by mouth as directed.     SPIRIVA HANDIHALER 18 MCG inhalation capsule 1 capsule daily.     albuterol (PROVENTIL) (2.5 MG/3ML) 0.083% nebulizer solution Take by nebulization. (Patient not taking: Reported on 05/29/2021)     albuterol (VENTOLIN HFA) 108 (90 Base) MCG/ACT inhaler Inhale into the lungs. (Patient not taking: Reported on 05/29/2021)     hydrOXYzine (ATARAX/VISTARIL) 10 MG tablet Take 1 tablet (10 mg total) by mouth 3 (three) times daily as needed. For itching. (Patient not taking: Reported on 05/29/2021) 30 tablet 0   olaparib (LYNPARZA) 150 MG tablet TAKE 2 TABLETS BY MOUTH 2 TIMES A DAY. MAY TAKE WITH  FOOD TO DECREASE NAUSEA AND VOMITING. SWALLOW TABLETS WHOLE. 120 tablet 0   oxyCODONE (OXYCONTIN) 15 mg 12 hr tablet Take 1 tablet (15 mg total) by mouth every 12 (twelve) hours. 60 tablet 0   oxyCODONE-acetaminophen (PERCOCET) 7.5-325 MG tablet Take 1 tablet by mouth every 8 (eight) hours as needed for severe pain. 60 tablet 0   prochlorperazine (COMPAZINE) 10 MG tablet Take 1 tablet (10 mg total) by mouth every 6 (six) hours as needed for nausea or vomiting. (Patient not taking: Reported on 05/29/2021) 30 tablet 3   No current facility-administered medications for this visit.      Marland Kitchen  PHYSICAL EXAMINATION: ECOG PERFORMANCE STATUS: 1 - Symptomatic but completely ambulatory  Vitals:   05/29/21 1004  BP: (!) 142/89  Pulse: 87  Resp: 18  Temp: 98 F (36.7 C)  SpO2: 98%   Filed Weights   05/29/21 1004  Weight: 216 lb (98 kg)    Physical Exam HENT:     Head: Normocephalic and atraumatic.     Mouth/Throat:     Pharynx: No oropharyngeal exudate.  Eyes:     Pupils: Pupils are equal, round, and reactive to light.  Cardiovascular:     Rate and Rhythm: Normal rate and regular rhythm.  Pulmonary:     Effort: Pulmonary effort is normal. No respiratory distress.     Breath sounds: Normal breath sounds. No wheezing.  Abdominal:     General: Bowel sounds are normal. There is no distension.     Palpations: Abdomen is soft. There is no mass.     Tenderness: There is no abdominal tenderness. There is no guarding or rebound.  Musculoskeletal:        General: No tenderness. Normal range of motion.     Cervical back: Normal range of motion and neck supple.  Skin:    General: Skin is warm.  Neurological:     Mental Status: He is alert and oriented to person, place, and time.  Psychiatric:        Mood and Affect: Affect normal.     LABORATORY DATA:  I have reviewed the data as listed Lab Results  Component Value Date   WBC 4.9 05/29/2021   HGB 12.6 (L) 05/29/2021   HCT 37.1 (L)  05/29/2021   MCV 87.1  05/29/2021   PLT 180 05/29/2021   Recent Labs    04/04/21 0902 04/24/21 0946 05/27/21 1613 05/29/21 0946  NA 138 136  --  135  K 3.1* 3.7  --  4.0  CL 105 103  --  101  CO2 26 25  --  27  GLUCOSE 148* 133*  --  150*  BUN 11 12  --  15  CREATININE 0.96 1.12 1.00 1.09  CALCIUM 8.8* 8.8*  --  9.1  GFRNONAA >60 >60  --  >60  PROT 7.0 7.4  --  7.7  ALBUMIN 3.9 4.2  --  4.3  AST 23 25  --  27  ALT 16 21  --  24  ALKPHOS 67 67  --  65  BILITOT 1.1 0.9  --  0.9    RADIOGRAPHIC STUDIES: I have personally reviewed the radiological images as listed and agreed with the findings in the report. CT CHEST ABDOMEN PELVIS W CONTRAST  Result Date: 05/28/2021 CLINICAL DATA:  Pancreatic cancer.  Restaging. EXAM: CT CHEST, ABDOMEN, AND PELVIS WITH CONTRAST TECHNIQUE: Multidetector CT imaging of the chest, abdomen and pelvis was performed following the standard protocol during bolus administration of intravenous contrast. CONTRAST:  197m OMNIPAQUE IOHEXOL 350 MG/ML SOLN COMPARISON:  Abdomen/pelvis CT 02/13/2021.  PET-CT 12/06/2020. FINDINGS: CT CHEST FINDINGS Cardiovascular: The heart size is normal. No substantial pericardial effusion. Coronary artery calcification is evident. Mild atherosclerotic calcification is noted in the wall of the thoracic aorta. Right Port-A-Cath tip is positioned at the SVC/RA junction. Mediastinum/Nodes: No mediastinal lymphadenopathy. No evidence for gross hilar lymphadenopathy although assessment is limited by the lack of intravenous contrast on the current study. The esophagus has normal imaging features. There is no axillary lymphadenopathy. Lungs/Pleura: No suspicious pulmonary nodule or mass. No focal airspace consolidation. No pleural effusion. Musculoskeletal: No worrisome lytic or sclerotic osseous abnormality. CT ABDOMEN PELVIS FINDINGS Hepatobiliary: Dominant complex cystic lesion in the right liver is not substantially changed in size since  prior CT measuring 9.1 x 6.8 x 5.9 cm today compared to 9.0 x 6.7 x 6.7 cm (remeasured) previously. The lesion does appear to have more cystic composition today with less enhancing soft tissue elements. No new suspicious lesion within the liver. There is no evidence for gallstones, gallbladder wall thickening, or pericholecystic fluid. No intrahepatic or extrahepatic biliary dilation. Pancreas: Multi lobular cystic and solid mass lesion in the tail of the pancreas measures 8.8 x 8.0 cm today compared to 9.0 x 8.1 cm previously. Previously described index lymph node medial to the mass (56/3) measures 5.9 x 5.2 cm compared to 5.1 x 4.9 cm previously. Index lymph node described previously between the stomach and the spleen measures 4.2 x 3.0 cm today (46/3) compared to 4.4 x 3.1 cm previously. Spleen: No splenomegaly. No focal mass lesion. Adrenals/Urinary Tract: Right adrenal gland unremarkable 1.9 cm left adrenal nodule is not substantially changed in the interval. Kidneys unremarkable. No evidence for hydroureter. The urinary bladder appears normal for the degree of distention. Stomach/Bowel: Mass-effect noted posterior fundus secondary to the mass lesion in the tail of pancreas. Duodenum is normally positioned as is the ligament of Treitz. No small bowel wall thickening. No small bowel dilatation. The terminal ileum is normal. The appendix is normal. No gross colonic mass. No colonic wall thickening. Diverticular changes are noted in the left colon without evidence of diverticulitis. Vascular/Lymphatic: There is mild atherosclerotic calcification of the abdominal aorta without aneurysm. There is no gastrohepatic or hepatoduodenal ligament  lymphadenopathy. No retroperitoneal or mesenteric lymphadenopathy. No pelvic sidewall lymphadenopathy. Reproductive: The prostate gland and seminal vesicles are unremarkable. Other: No intraperitoneal free fluid. Musculoskeletal: No worrisome lytic or sclerotic osseous  abnormality. Subtle asymmetry in sclerosis in the posterior left iliac bone is stable. IMPRESSION: 1. No substantial interval change in exam. 2. Complex cystic and solid mass lesion in the tail of pancreas is similar to prior with dominant peripancreatic lymph nodes not substantially changed. 3. Dominant complex cystic lesion in the right liver is not substantially changed in size since prior CT. The lesion does appear to have more cystic composition today with less enhancing soft tissue elements. 4. Stable 1.9 cm left adrenal nodule. 5. Aortic Atherosclerosis (ICD10-I70.0). Electronically Signed   By: Misty Stanley M.D.   On: 05/28/2021 09:26    ASSESSMENT & PLAN:   Pancreatic cancer metastasized to liver [facility]) #Pancreatic acinar cell cancer [- BRCA-1-genetic mutation] with metastasis to liver. STAGE IV- Currently Lynparza; CT CAP- OCT 3rd, 2022-  No substantial interval change in exam; Complex cystic and solid mass lesion in the tail of pancreas/LN-peripancreatic- STABLE. . Stable 1.9 cm left adrenal nodule.  # No overt progression of disease noted; stable.  Tolerating Lynparza fairly well-continue Lynparza at this time.  Will repeat imaging again in 3 months.   # Back pain- sec to malignancy/ Percocet 7.5 approximately 2 times a day. CONTINUE OXYCONTIN 15 mg BID; continue percocet-STABLE.   # Diabetes-continue current medication-FBS/ PPF ~150-STABLE.   # Mild hypokalemia-K-3.7- Continue Dietary; STOPPED Kdur; STABLE.   # IV ACCESS: no malfunction- port flush/ 10-5- STABLE.   #Long-term disability-given patient's ongoing fatigue/need for narcotic pain medication-in the context of his incurable cancer-long-term disability is reasonable.   # DISPOSITION:  #  follow up in 4 weeks- - MD; labs- cbc/cmp- Dr.B  # I reviewed the blood work- with the patient in detail; also reviewed the imaging independently [as summarized above]; and with the patient in detail.   All questions were answered. The  patient knows to call the clinic with any problems, questions or concerns.    Dan Sickle, MD 05/29/2021 11:43 AM

## 2021-05-29 NOTE — Progress Notes (Signed)
Right shoulder and arm pain, back pain

## 2021-05-29 NOTE — Assessment & Plan Note (Addendum)
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 STAGE IV- Currently Lynparza; CT CAP- OCT 3rd, 2022-  No substantial interval change in exam; Complex cystic and solid mass lesion in the tail of pancreas/LN-peripancreatic- STABLE. . Stable 1.9 cm left adrenal nodule.  # No overt progression of disease noted; stable.  Tolerating Lynparza fairly well-continue Lynparza at this time.  Will repeat imaging again in 3 months.   # Back pain- sec to malignancy/ Percocet 7.5 approximately 2 times a day. CONTINUE OXYCONTIN 15 mg BID; continue percocet-STABLE.   # Diabetes-continue current medication-FBS/ PPF ~150-STABLE.   # Mild hypokalemia-K-3.7- Continue Dietary; STOPPED Kdur; STABLE.   # IV ACCESS: no malfunction- port flush/ 10-5- STABLE.   #Long-term disability-given patient's ongoing fatigue/need for narcotic pain medication-in the context of his incurable cancer-long-term disability is reasonable.   # DISPOSITION:  #  follow up in 4 weeks- - MD; labs- cbc/cmp- Dr.B  # I reviewed the blood work- with the patient in detail; also reviewed the imaging independently [as summarized above]; and with the patient in detail.

## 2021-05-31 ENCOUNTER — Encounter: Payer: Self-pay | Admitting: Internal Medicine

## 2021-06-26 ENCOUNTER — Inpatient Hospital Stay: Payer: BLUE CROSS/BLUE SHIELD | Attending: Internal Medicine

## 2021-06-26 ENCOUNTER — Other Ambulatory Visit: Payer: Self-pay

## 2021-06-26 ENCOUNTER — Inpatient Hospital Stay (HOSPITAL_BASED_OUTPATIENT_CLINIC_OR_DEPARTMENT_OTHER): Payer: BLUE CROSS/BLUE SHIELD | Admitting: Internal Medicine

## 2021-06-26 ENCOUNTER — Encounter: Payer: Self-pay | Admitting: Internal Medicine

## 2021-06-26 VITALS — BP 142/93 | HR 88 | Temp 96.3°F | Resp 20 | Ht 72.0 in | Wt 213.0 lb

## 2021-06-26 DIAGNOSIS — Z452 Encounter for adjustment and management of vascular access device: Secondary | ICD-10-CM | POA: Insufficient documentation

## 2021-06-26 DIAGNOSIS — C259 Malignant neoplasm of pancreas, unspecified: Secondary | ICD-10-CM

## 2021-06-26 DIAGNOSIS — G893 Neoplasm related pain (acute) (chronic): Secondary | ICD-10-CM | POA: Diagnosis not present

## 2021-06-26 DIAGNOSIS — C787 Secondary malignant neoplasm of liver and intrahepatic bile duct: Secondary | ICD-10-CM | POA: Diagnosis not present

## 2021-06-26 DIAGNOSIS — K219 Gastro-esophageal reflux disease without esophagitis: Secondary | ICD-10-CM | POA: Insufficient documentation

## 2021-06-26 DIAGNOSIS — E119 Type 2 diabetes mellitus without complications: Secondary | ICD-10-CM | POA: Diagnosis not present

## 2021-06-26 LAB — CBC WITH DIFFERENTIAL/PLATELET
Abs Immature Granulocytes: 0.01 10*3/uL (ref 0.00–0.07)
Basophils Absolute: 0 10*3/uL (ref 0.0–0.1)
Basophils Relative: 1 %
Eosinophils Absolute: 0.2 10*3/uL (ref 0.0–0.5)
Eosinophils Relative: 4 %
HCT: 37.7 % — ABNORMAL LOW (ref 39.0–52.0)
Hemoglobin: 12.5 g/dL — ABNORMAL LOW (ref 13.0–17.0)
Immature Granulocytes: 0 %
Lymphocytes Relative: 27 %
Lymphs Abs: 1.3 10*3/uL (ref 0.7–4.0)
MCH: 30 pg (ref 26.0–34.0)
MCHC: 33.2 g/dL (ref 30.0–36.0)
MCV: 90.4 fL (ref 80.0–100.0)
Monocytes Absolute: 0.5 10*3/uL (ref 0.1–1.0)
Monocytes Relative: 10 %
Neutro Abs: 2.8 10*3/uL (ref 1.7–7.7)
Neutrophils Relative %: 58 %
Platelets: 194 10*3/uL (ref 150–400)
RBC: 4.17 MIL/uL — ABNORMAL LOW (ref 4.22–5.81)
RDW: 19.5 % — ABNORMAL HIGH (ref 11.5–15.5)
WBC: 4.8 10*3/uL (ref 4.0–10.5)
nRBC: 0 % (ref 0.0–0.2)

## 2021-06-26 LAB — COMPREHENSIVE METABOLIC PANEL
ALT: 30 U/L (ref 0–44)
AST: 29 U/L (ref 15–41)
Albumin: 4.2 g/dL (ref 3.5–5.0)
Alkaline Phosphatase: 68 U/L (ref 38–126)
Anion gap: 7 (ref 5–15)
BUN: 12 mg/dL (ref 6–20)
CO2: 27 mmol/L (ref 22–32)
Calcium: 9.1 mg/dL (ref 8.9–10.3)
Chloride: 106 mmol/L (ref 98–111)
Creatinine, Ser: 0.96 mg/dL (ref 0.61–1.24)
GFR, Estimated: >60 mL/min (ref >60)
Glucose, Bld: 111 mg/dL — ABNORMAL HIGH (ref 70–99)
Potassium: 3.6 mmol/L (ref 3.5–5.1)
Sodium: 140 mmol/L (ref 135–145)
Total Bilirubin: 1 mg/dL (ref 0.3–1.2)
Total Protein: 7.5 g/dL (ref 6.5–8.1)

## 2021-06-26 MED ORDER — HEPARIN SOD (PORK) LOCK FLUSH 100 UNIT/ML IV SOLN
500.0000 [IU] | Freq: Once | INTRAVENOUS | Status: AC
  Administered 2021-06-26: 500 [IU] via INTRAVENOUS
  Filled 2021-06-26: qty 5

## 2021-06-26 MED ORDER — OXYCODONE HCL ER 15 MG PO T12A: 15.0000 mg | EXTENDED_RELEASE_TABLET | Freq: Two times a day (BID) | ORAL | 0 refills | Status: DC

## 2021-06-26 MED ORDER — OXYCODONE-ACETAMINOPHEN 7.5-325 MG PO TABS: 1.0000 | ORAL_TABLET | Freq: Three times a day (TID) | ORAL | 0 refills | Status: DC | PRN

## 2021-06-26 MED ORDER — SODIUM CHLORIDE 0.9% FLUSH
10.0000 mL | INTRAVENOUS | Status: DC | PRN
  Administered 2021-06-26: 10 mL via INTRAVENOUS
  Filled 2021-06-26: qty 10

## 2021-06-26 NOTE — Assessment & Plan Note (Addendum)
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 STAGE IV- Currently Lynparza; CT CAP- OCT 3rd, 2022-  No substantial interval change in exam; Complex cystic and solid mass lesion in the tail of pancreas/LN-peripancreatic-1.9 cm left adrenal nodule. STABLE. .  # No overt progression of disease noted; Tolerating Lynparza 300 mg BID; fairly well-continue Lynparza at this time.  Will repeat imaging again in 2  Months; CT scan abdomen pelvis ordered today   # Back pain- sec to malignancy/ Percocet 7.5 approximately 2 times a day. CONTINUE OXYCONTIN 15 mg BID; continue percocet-stable.  Refill.  # Diabetes-continue current medication-FBS/ PPF ~111--stable  # IV ACCESS: no malfunction- port flush/ 10-5-stable  # Reflux- recommend PPI once a day.   # DISPOSITION: mychart #  follow up in second week of Jan 2023- - MD; port flush labs- cbc/cmp; CT prior-- Dr.B

## 2021-06-26 NOTE — Progress Notes (Signed)
[name] CONSULT NOTE  Patient Care Team: [facility] as PCP - General Dan Gutter, RN as Oncology Nurse Navigator  CHIEF COMPLAINTS/PURPOSE OF CONSULTATION: Pancreatic cancer   Oncology History Overview Note  # April 2022- [name],  BIOPSY:  - METASTATIC PANCREATIC ACINAR CELL CARCINOMA.; STAGE IV; CT scan shows-approximately 8cm centimeter pancreatic mass; adrenal lesion; also approximately 6 cm liver lesion.   ; LIPASE NORMAL.   # April 18ht, 2022- FOLFIRINOX; udenyca x4 4 cycles-June 2022-progressive disease noted-[S/p palliative chemotherapy with FOLFIRINOX s/p 4 cyc;es-June 23rd- 2022-INCREASE in size of large pancreatic tail mass, and adjacent peripancreatic lymphadenopathy. Increased size of large complex cystic and solid mass in right hepatic lobe, consistent with hepatic metastasis. 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 Adrenal metastasis cannot be excluded]-discontinue FOLFIRINOX; declines chemotherapy-cis gem  # July 21st 2022-Lynparza 300 mg twice daily [BRCA-1 mutation-genetic mutation s/p genetic counseling ]  # July 19th 2022- Lynparza 300 mg BID   # NGS/MOLECULAR TESTS: BRCA-1*/genetic mutation  #BRCA1 genetic mutation-s/p genetic counseling [July 2022]    # PALLIATIVE CARE EVALUATION:  # PAIN MANAGEMENT:    DIAGNOSIS: Pancreatic acinar carcinoma  STAGE:  IV       ;  GOALS:pallaitive  CURRENT/MOST RECENT THERAPY :     Pancreatic cancer metastasized to liver (HCC)  11/23/2020 Initial Diagnosis   Pancreatic cancer metastasized to liver (HCC)   11/28/2020 Cancer Staging   Staging form: Exocrine Pancreas, AJCC 8th Edition - Clinical: Stage IV (cM1) - Signed by Dan Coder, MD on 11/28/2020 Histopathologic type: Acinar cell carcinoma    12/10/2020 -  Chemotherapy    Patient is on Treatment Plan: PANCREAS MODIFIED FOLFIRINOX Q14D X 4 CYCLES        HISTORY OF PRESENTING  ILLNESS:  Accompanied by wife.  Ambulating independently.  [name] 55 y.o.  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  Denies any worsening fatigue.  Chronic back pain joint pain for which he is on narcotic pain medication.  No worsening abdominal pain. No nausea no vomiting.  No worsening constipation. .  Review of Systems  Constitutional:  Positive for malaise/fatigue. Negative for chills, diaphoresis and fever.  HENT:  Negative for nosebleeds.   Eyes:  Negative for double vision.  Respiratory:  Negative for cough, hemoptysis, sputum production, shortness of breath and wheezing.   Cardiovascular:  Negative for chest pain, palpitations, orthopnea and leg swelling.  Gastrointestinal:  Negative for blood in stool, constipation, diarrhea, heartburn, melena and vomiting.  Genitourinary:  Negative for dysuria, frequency and urgency.  Musculoskeletal:  Negative for back pain and joint pain.  Skin: Negative.  Negative for itching and rash.  Neurological:  Negative for dizziness, tingling, focal weakness, weakness and headaches.  Endo/Heme/Allergies:  Does not bruise/bleed easily.  Psychiatric/Behavioral:  Negative for depression. The patient is not nervous/anxious and does not have insomnia.     MEDICAL HISTORY:  Past Medical History:  Diagnosis Date  . Acid reflux   . Asthma   . Diabetes mellitus without complication (HCC)   . Family history of breast cancer   . History of kidney stones   . Hypertension   . Kidney stones     SURGICAL HISTORY: Past Surgical History:  Procedure Laterality Date  . COLONOSCOPY WITH PROPOFOL N/A 12/02/2019   Procedure: COLONOSCOPY WITH PROPOFOL;  Surgeon: Dan Mood, MD;  Location: [facility] ENDOSCOPY;  Service: Gastroenterology;  Laterality: N/A;  . FRACTURE SURGERY Left at age 65   pelvic fracture  . HEMORRHOID SURGERY N/A 01/31/2020   Procedure: HEMORRHOIDECTOMY;  Surgeon:  Dan Husbands, MD;  Location: [facility] ORS;  Service: General;  Laterality: N/A;  . IR IMAGING GUIDED PORT INSERTION  11/30/2020  . pelvis fractur    . ROTATOR CUFF REPAIR      SOCIAL HISTORY: Social History   Socioeconomic History  . Marital status: Married    Spouse name: Not on file  . Number of children: Not on file  . Years of education: Not on file  . Highest education level: Not on file  Occupational History  . Not on file  Tobacco Use  . Smoking status: Former    Packs/day: 0.50    Years: 25.00    Pack years: 12.50    Types: Cigarettes  . Smokeless tobacco: Never  Vaping Use  . Vaping Use: Never used  Substance and Sexual Activity  . Alcohol use: Yes    Alcohol/week: 6.0 standard drinks    Types: 6 Cans of beer per week    Comment: weekends only  . Drug use: No  . Sexual activity: Yes  Other Topics Concern  . Not on file  Social History Narrative   Works at [employer]. In [place]. Quit smoking 2021 June. Social. Alcohol.    Social Determinants of Health   Financial Resource Strain: Not on file  Food Insecurity: Not on file  Transportation Needs: Not on file  Physical Activity: Not on file  Stress: Not on file  Social Connections: Not on file  Intimate Partner Violence: Not on file    FAMILY HISTORY: Family History  Problem Relation Age of Onset  . Alzheimer's disease Mother   . Heart attack Father   . Cancer Sister        2 sisters 45 and 39- both died of breast cancer  . Diabetes Brother   . Hypertension Brother   . Asthma Son     ALLERGIES:  is allergic to [employer] [name] [employer] [aprepitant], cherry, ibuprofen, other, and tramadol.  MEDICATIONS:  Current Outpatient Medications  Medication Sig Dispense Refill  . amitriptyline (ELAVIL) 10 MG tablet Take 10 mg by mouth at bedtime.    Marland Kitchen amLODipine (NORVASC) 10 MG tablet TAKE 1 TABLET(10 MG) BY MOUTH DAILY (Patient taking differently: Take 10 mg by mouth daily.) 90 tablet 1  . chlorthalidone (HYGROTON) 25 MG tablet Take  25 mg by mouth every morning.    Marland Kitchen FLOVENT HFA 110 MCG/ACT inhaler Inhale 1 puff into the lungs 2 (two) times daily.    Marland Kitchen JARDIANCE 10 MG TABS tablet Take 10 mg by mouth daily.    Marland Kitchen lidocaine-prilocaine (EMLA) cream Apply 1 application topically as needed (prior to port a cath needle access on the days of chemotherapy). 30 g 3  . losartan (COZAAR) 100 MG tablet Take 100 mg by mouth daily.    Marland Kitchen olaparib (LYNPARZA) 150 MG tablet TAKE 2 TABLETS BY MOUTH 2 TIMES A DAY. MAY TAKE WITH FOOD TO DECREASE NAUSEA AND VOMITING. SWALLOW TABLETS WHOLE. 120 tablet 0  . omeprazole (PRILOSEC) 20 MG capsule Take 20 mg by mouth 2 (two) times daily.    Marland Kitchen omeprazole (PRILOSEC) 40 MG capsule Take 1 capsule (40 mg total) by mouth daily. 30 capsule 3  . sildenafil (VIAGRA) 100 MG tablet Take 100 mg by mouth as directed.    Marland Kitchen SPIRIVA HANDIHALER 18 MCG inhalation capsule 1 capsule  daily.    . albuterol (PROVENTIL) (2.5 MG/3ML) 0.083% nebulizer solution Take by nebulization. (Patient not taking: No sig reported)    . albuterol (VENTOLIN HFA) 108 (90 Base) MCG/ACT inhaler Inhale into the lungs. (Patient not taking: No sig reported)    . magic mouthwash w/lidocaine SOLN Take 5 mLs by mouth 4 (four) times daily. 80 ml viscous lidocaine 2%, 80 ml Mylanta, 80 ml Diphenhydramine 12.5 mg/5 ml Elixir, 80 ml Nystatin 100,000 Unit suspension, 80 ml Prednisolone 15 mg/2ml, 80 ml Distilled Water. Sig: Swish/Swallow 5-10 ml four times a day as needed. Dispense 480 ml. 3RFs (Patient not taking: Reported on 06/26/2021) 480 mL 3  . nystatin (MYCOSTATIN) 100000 UNIT/ML suspension Take by mouth. (Patient not taking: Reported on 06/26/2021)    . ondansetron (ZOFRAN) 8 MG tablet Take 1 tablet (8 mg total) by mouth every 8 (eight) hours as needed for nausea, vomiting or refractory nausea / vomiting. (Patient not taking: Reported on 06/26/2021) 20 tablet 3  . oxyCODONE (OXYCONTIN) 15 mg 12 hr tablet Take 1 tablet (15 mg total) by mouth every 12  (twelve) hours. 60 tablet 0  . oxyCODONE-acetaminophen (PERCOCET) 7.5-325 MG tablet Take 1 tablet by mouth every 8 (eight) hours as needed for severe pain. 60 tablet 0  . prochlorperazine (COMPAZINE) 10 MG tablet Take 1 tablet (10 mg total) by mouth every 6 (six) hours as needed for nausea or vomiting. (Patient not taking: No sig reported) 30 tablet 3   No current facility-administered medications for this visit.      Marland Kitchen  PHYSICAL EXAMINATION: ECOG PERFORMANCE STATUS: 1 - Symptomatic but completely ambulatory  Vitals:   06/26/21 1313  BP: (!) 142/93  Pulse: 88  Resp: 20  Temp: (!) 96.3 F (35.7 C)   Filed Weights   06/26/21 1313  Weight: 213 lb (96.6 kg)    Physical Exam HENT:     Head: Normocephalic and atraumatic.     Mouth/Throat:     Pharynx: No oropharyngeal exudate.  Eyes:     Pupils: Pupils are equal, round, and reactive to light.  Cardiovascular:     Rate and Rhythm: Normal rate and regular rhythm.  Pulmonary:     Effort: Pulmonary effort is normal. No respiratory distress.     Breath sounds: Normal breath sounds. No wheezing.  Abdominal:     General: Bowel sounds are normal. There is no distension.     Palpations: Abdomen is soft. There is no mass.     Tenderness: There is no abdominal tenderness. There is no guarding or rebound.  Musculoskeletal:        General: No tenderness. Normal range of motion.     Cervical back: Normal range of motion and neck supple.  Skin:    General: Skin is warm.  Neurological:     Mental Status: He is alert and oriented to person, place, and time.  Psychiatric:        [name] and Affect: Affect normal.     LABORATORY DATA:  I have reviewed the data as listed Lab Results  Component Value Date   WBC 4.8 06/26/2021   HGB 12.5 (L) 06/26/2021   HCT 37.7 (L) 06/26/2021   MCV 90.4 06/26/2021   PLT 194 06/26/2021   Recent Labs    04/24/21 0946 05/27/21 1613 05/29/21 0946 06/26/21 1301  NA 136  --  135 140  K 3.7  --   4.0 3.6  CL 103  --  101 106  CO2 25  --  27 27  GLUCOSE 133*  --  150* 111*  BUN 12  --  15 12  CREATININE 1.12 1.00 1.09 0.96  CALCIUM 8.8*  --  9.1 9.1  GFRNONAA >60  --  >60 >60  PROT 7.4  --  7.7 7.5  ALBUMIN 4.2  --  4.3 4.2  AST 25  --  27 29  ALT 21  --  24 30  ALKPHOS 67  --  65 68  BILITOT 0.9  --  0.9 1.0    RADIOGRAPHIC STUDIES: I have personally reviewed the radiological images as listed and agreed with the findings in the report. CT CHEST ABDOMEN PELVIS W CONTRAST  Result Date: 05/28/2021 CLINICAL DATA:  Pancreatic cancer.  Restaging. EXAM: CT CHEST, ABDOMEN, AND PELVIS WITH CONTRAST TECHNIQUE: Multidetector CT imaging of the chest, abdomen and pelvis was performed following the standard protocol during bolus administration of intravenous contrast. CONTRAST:  183mL OMNIPAQUE IOHEXOL 350 MG/ML SOLN COMPARISON:  Abdomen/pelvis CT 02/13/2021.  PET-CT 12/06/2020. FINDINGS: CT CHEST FINDINGS Cardiovascular: The heart size is normal. No substantial pericardial effusion. Coronary artery calcification is evident. Mild atherosclerotic calcification is noted in the wall of the thoracic aorta. Right Port-A-Cath tip is positioned at the SVC/RA junction. Mediastinum/Nodes: No mediastinal lymphadenopathy. No evidence for gross hilar lymphadenopathy although assessment is limited by the lack of intravenous contrast on the current study. The esophagus has normal imaging features. There is no axillary lymphadenopathy. Lungs/Pleura: No suspicious pulmonary nodule or mass. No focal airspace consolidation. No pleural effusion. Musculoskeletal: No worrisome lytic or sclerotic osseous abnormality. CT ABDOMEN PELVIS FINDINGS Hepatobiliary: Dominant complex cystic lesion in the right liver is not substantially changed in size since prior CT measuring 9.1 x 6.8 x 5.9 cm today compared to 9.0 x 6.7 x 6.7 cm (remeasured) previously. The lesion does appear to have more cystic composition today with less  enhancing soft tissue elements. No new suspicious lesion within the liver. There is no evidence for gallstones, gallbladder wall thickening, or pericholecystic fluid. No intrahepatic or extrahepatic biliary dilation. Pancreas: Multi lobular cystic and solid mass lesion in the tail of the pancreas measures 8.8 x 8.0 cm today compared to 9.0 x 8.1 cm previously. Previously described index lymph node medial to the mass (56/3) measures 5.9 x 5.2 cm compared to 5.1 x 4.9 cm previously. Index lymph node described previously between the stomach and the spleen measures 4.2 x 3.0 cm today (46/3) compared to 4.4 x 3.1 cm previously. Spleen: No splenomegaly. No focal mass lesion. Adrenals/Urinary Tract: Right adrenal gland unremarkable 1.9 cm left adrenal nodule is not substantially changed in the interval. Kidneys unremarkable. No evidence for hydroureter. The urinary bladder appears normal for the degree of distention. Stomach/Bowel: Mass-effect noted posterior fundus secondary to the mass lesion in the tail of pancreas. Duodenum is normally positioned as is the ligament of Treitz. No small bowel wall thickening. No small bowel dilatation. The terminal ileum is normal. The appendix is normal. No gross colonic mass. No colonic wall thickening. Diverticular changes are noted in the left colon without evidence of diverticulitis. Vascular/Lymphatic: There is mild atherosclerotic calcification of the abdominal aorta without aneurysm. There is no gastrohepatic or hepatoduodenal ligament lymphadenopathy. No retroperitoneal or mesenteric lymphadenopathy. No pelvic sidewall lymphadenopathy. Reproductive: The prostate gland and seminal vesicles are unremarkable. Other: No intraperitoneal free fluid. Musculoskeletal: No worrisome lytic or sclerotic osseous abnormality. Subtle asymmetry in sclerosis in the posterior left iliac bone is stable. IMPRESSION: 1. No substantial interval change in  exam. 2. Complex cystic and solid mass lesion  in the tail of pancreas is similar to prior with dominant peripancreatic lymph nodes not substantially changed. 3. Dominant complex cystic lesion in the right liver is not substantially changed in size since prior CT. The lesion does appear to have more cystic composition today with less enhancing soft tissue elements. 4. Stable 1.9 cm left adrenal nodule. 5. Aortic Atherosclerosis (ICD10-I70.0). Electronically Signed   By: Misty Stanley M.D.   On: 05/28/2021 09:26    ASSESSMENT & PLAN:   Pancreatic cancer metastasized to liver [name]) #Pancreatic acinar cell cancer [- BRCA-1-genetic mutation] with metastasis to liver. STAGE IV- Currently Lynparza; CT CAP- OCT 3rd, 2022-  No substantial interval change in exam; Complex cystic and solid mass lesion in the tail of pancreas/LN-peripancreatic-1.9 cm left adrenal nodule. STABLE. .  # No overt progression of disease noted; Tolerating Lynparza 300 mg BID; fairly well-continue Lynparza at this time.  Will repeat imaging again in 2  Months; CT scan abdomen pelvis ordered today   # Back pain- sec to malignancy/ Percocet 7.5 approximately 2 times a day. CONTINUE OXYCONTIN 15 mg BID; continue percocet-stable.  Refill.  # Diabetes-continue current medication-FBS/ PPF ~111--stable  # IV ACCESS: no malfunction- port flush/ 10-5-stable  # Reflux- recommend PPI once a day.   # DISPOSITION: mychart #  follow up in second week of Jan 2023- - MD; port flush labs- cbc/cmp; CT prior-- Dr.B   All questions were answered. The patient knows to call the clinic with any problems, questions or concerns.    [name], MD 06/26/2021 2:56 PM

## 2021-07-03 ENCOUNTER — Other Ambulatory Visit: Payer: Self-pay | Admitting: Internal Medicine

## 2021-07-03 DIAGNOSIS — C259 Malignant neoplasm of pancreas, unspecified: Secondary | ICD-10-CM

## 2021-07-23 ENCOUNTER — Other Ambulatory Visit: Payer: Self-pay | Admitting: *Deleted

## 2021-07-23 MED ORDER — OXYCODONE-ACETAMINOPHEN 7.5-325 MG PO TABS: 1.0000 | ORAL_TABLET | Freq: Three times a day (TID) | ORAL | 0 refills | Status: DC | PRN

## 2021-07-23 MED ORDER — OXYCODONE HCL ER 15 MG PO T12A: 15.0000 mg | EXTENDED_RELEASE_TABLET | Freq: Two times a day (BID) | ORAL | 0 refills | Status: DC

## 2021-07-24 ENCOUNTER — Other Ambulatory Visit: Payer: Self-pay | Admitting: Hospice and Palliative Medicine

## 2021-07-24 NOTE — Telephone Encounter (Signed)
My Chart message sent to patient informing him refills were sent to pharmacy yesterday and please call pharmacy to see if rxs ready for pick up

## 2021-08-23 ENCOUNTER — Other Ambulatory Visit: Payer: Self-pay | Admitting: Hospice and Palliative Medicine

## 2021-08-23 MED ORDER — OXYCODONE-ACETAMINOPHEN 7.5-325 MG PO TABS: 1.0000 | ORAL_TABLET | Freq: Three times a day (TID) | ORAL | 0 refills | Status: DC | PRN

## 2021-08-26 ENCOUNTER — Other Ambulatory Visit: Payer: Self-pay | Admitting: Internal Medicine

## 2021-08-26 DIAGNOSIS — C259 Malignant neoplasm of pancreas, unspecified: Secondary | ICD-10-CM

## 2021-08-27 ENCOUNTER — Ambulatory Visit
Admission: RE | Admit: 2021-08-27 | Discharge: 2021-08-27 | Disposition: A | Discharge status: Home / Self Care | Payer: BLUE CROSS/BLUE SHIELD | Source: Ambulatory Visit | Attending: Internal Medicine | Admitting: Internal Medicine

## 2021-08-27 ENCOUNTER — Encounter: Payer: Self-pay | Admitting: Internal Medicine

## 2021-08-27 DIAGNOSIS — C259 Malignant neoplasm of pancreas, unspecified: Secondary | ICD-10-CM | POA: Diagnosis not present

## 2021-08-27 DIAGNOSIS — C787 Secondary malignant neoplasm of liver and intrahepatic bile duct: Secondary | ICD-10-CM | POA: Insufficient documentation

## 2021-08-27 LAB — POCT I-STAT CREATININE: Creatinine, Ser: 1.3 mg/dL — ABNORMAL HIGH (ref 0.61–1.24)

## 2021-08-27 MED ORDER — IOHEXOL 300 MG/ML  SOLN
100.0000 mL | Freq: Once | INTRAMUSCULAR | Status: AC | PRN
  Administered 2021-08-27: 100 mL via INTRAVENOUS

## 2021-09-04 ENCOUNTER — Inpatient Hospital Stay (HOSPITAL_BASED_OUTPATIENT_CLINIC_OR_DEPARTMENT_OTHER): Payer: BLUE CROSS/BLUE SHIELD | Admitting: Internal Medicine

## 2021-09-04 ENCOUNTER — Other Ambulatory Visit: Payer: Self-pay

## 2021-09-04 ENCOUNTER — Encounter: Payer: Self-pay | Admitting: Internal Medicine

## 2021-09-04 ENCOUNTER — Inpatient Hospital Stay: Payer: BLUE CROSS/BLUE SHIELD | Attending: Internal Medicine

## 2021-09-04 DIAGNOSIS — C259 Malignant neoplasm of pancreas, unspecified: Secondary | ICD-10-CM

## 2021-09-04 DIAGNOSIS — C787 Secondary malignant neoplasm of liver and intrahepatic bile duct: Secondary | ICD-10-CM | POA: Insufficient documentation

## 2021-09-04 DIAGNOSIS — C252 Malignant neoplasm of tail of pancreas: Secondary | ICD-10-CM | POA: Insufficient documentation

## 2021-09-04 DIAGNOSIS — Z452 Encounter for adjustment and management of vascular access device: Secondary | ICD-10-CM | POA: Insufficient documentation

## 2021-09-04 LAB — CBC WITH DIFFERENTIAL/PLATELET
Abs Immature Granulocytes: 0.02 10*3/uL (ref 0.00–0.07)
Basophils Absolute: 0 10*3/uL (ref 0.0–0.1)
Basophils Relative: 1 %
Eosinophils Absolute: 0.1 10*3/uL (ref 0.0–0.5)
Eosinophils Relative: 2 %
HCT: 34 % — ABNORMAL LOW (ref 39.0–52.0)
Hemoglobin: 11.4 g/dL — ABNORMAL LOW (ref 13.0–17.0)
Immature Granulocytes: 0 %
Lymphocytes Relative: 31 %
Lymphs Abs: 1.8 10*3/uL (ref 0.7–4.0)
MCH: 31.3 pg (ref 26.0–34.0)
MCHC: 33.5 g/dL (ref 30.0–36.0)
MCV: 93.4 fL (ref 80.0–100.0)
Monocytes Absolute: 0.5 10*3/uL (ref 0.1–1.0)
Monocytes Relative: 8 %
Neutro Abs: 3.4 10*3/uL (ref 1.7–7.7)
Neutrophils Relative %: 58 %
Platelets: 218 10*3/uL (ref 150–400)
RBC: 3.64 MIL/uL — ABNORMAL LOW (ref 4.22–5.81)
RDW: 13.7 % (ref 11.5–15.5)
WBC: 5.8 10*3/uL (ref 4.0–10.5)
nRBC: 0 % (ref 0.0–0.2)

## 2021-09-04 LAB — COMPREHENSIVE METABOLIC PANEL
ALT: 20 U/L (ref 0–44)
AST: 23 U/L (ref 15–41)
Albumin: 4.1 g/dL (ref 3.5–5.0)
Alkaline Phosphatase: 103 U/L (ref 38–126)
Anion gap: 6 (ref 5–15)
BUN: 13 mg/dL (ref 6–20)
CO2: 27 mmol/L (ref 22–32)
Calcium: 8.9 mg/dL (ref 8.9–10.3)
Chloride: 106 mmol/L (ref 98–111)
Creatinine, Ser: 1.06 mg/dL (ref 0.61–1.24)
GFR, Estimated: >60 mL/min (ref >60)
Glucose, Bld: 106 mg/dL — ABNORMAL HIGH (ref 70–99)
Potassium: 3.6 mmol/L (ref 3.5–5.1)
Sodium: 139 mmol/L (ref 135–145)
Total Bilirubin: 0.5 mg/dL (ref 0.3–1.2)
Total Protein: 7.4 g/dL (ref 6.5–8.1)

## 2021-09-04 MED ORDER — OXYCODONE HCL ER 15 MG PO T12A: 15.0000 mg | EXTENDED_RELEASE_TABLET | Freq: Two times a day (BID) | ORAL | 0 refills | Status: DC

## 2021-09-04 MED ORDER — SODIUM CHLORIDE 0.9% FLUSH
10.0000 mL | Freq: Once | INTRAVENOUS | Status: AC
  Administered 2021-09-04: 10 mL via INTRAVENOUS
  Filled 2021-09-04: qty 10

## 2021-09-04 MED ORDER — HEPARIN SOD (PORK) LOCK FLUSH 100 UNIT/ML IV SOLN
500.0000 [IU] | Freq: Once | INTRAVENOUS | Status: AC
  Administered 2021-09-04: 500 [IU] via INTRAVENOUS
  Filled 2021-09-04: qty 5

## 2021-09-04 NOTE — Assessment & Plan Note (Addendum)
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 STAGE IV- Currently Lynparza;- CT scan JAN, 3rd 2023-increase in size of the  Large, heterogeneous mixed solid and cystic metastatic mass of the right lobe of the liver [measuring approximately 13 cm previously 11cm;] also increase in size of the exophytic mixed solid and cystic masses about the superior aspect of the pancreatic tail; adrenal nodule stable.  #Given the obvious progression of disease I would recommend discontinuation of [name] [pt will finish current available pills].  I recommend chemotherapy with cisplatin-Gemcitabine-given the progressive disease/BRCA positive-1 germline mutation.   #Wife categorically declines any form of chemotherapy; stating that she would not want her husband to go through treatments- "as chemotherapy is poison".  Patient is not interested in chemotherapy either.  Recommend a second opinion at [facility] [previous evaluation in 2022].   # Given the incurable nature of the disease /poor tolerance of therapy and in general less than 6 months of life expectancy/especially as patient not interested in further chemotherapy. I introduced hospice philosophy to the patient and family.  Discussed that goal of care should be directed to symptom management rather than treating the underlying disease; and in the process help improve quality of life rather than quantity.  Discussed with hospice team would include-nurse, nurse aide, social worker and chaplain for help take care of patient with physical/emotional needs.  We will make a hospice referral.  # Back pain- sec to malignancy/ Percocet 7.5 TID prn; . CONTINUE OXYCONTIN 15 mg BID.  Send a refill.  # IV ACCESS: no malfunction- port flush/ -stable   # BIL UE cramping: ?  Carpal tunnel syndrome-recommend elevation of the hands bilaterally/when resting.  # DISPOSITION: # referral to hospice re: pancreatic cancer #  follow up in 1 month MD;  port flush labs- cbc/cmp Dr.B  # I reviewed the blood work- with the patient in detail; also reviewed the imaging independently [as summarized above]; and with the patient in detail.

## 2021-09-04 NOTE — Progress Notes (Signed)
[place] NOTE  Patient Care Team: [facility] as PCP - General Clent Jacks, RN as Oncology Nurse Navigator [name], MD as Consulting Physician (Internal Medicine) Tester, Baker Pierini as Physician Assistant (Physician Assistant)  CHIEF COMPLAINTS/PURPOSE OF CONSULTATION: Pancreatic cancer   Oncology History Overview Note  # April 2022- [name],  BIOPSY:  - METASTATIC PANCREATIC ACINAR CELL CARCINOMA.; STAGE IV; CT scan shows-approximately 8cm centimeter pancreatic mass; adrenal lesion; also approximately 6 cm liver lesion.   ; LIPASE NORMAL.   # April 18ht, 2022- FOLFIRINOX; udenyca x4 4 cycles-June 2022-progressive disease noted-[S/p palliative chemotherapy with FOLFIRINOX s/p 4 cyc;es-June 23rd- 2022-INCREASE in size of large pancreatic tail mass, and adjacent peripancreatic lymphadenopathy. Increased size of large complex cystic and solid mass in right hepatic lobe, consistent with hepatic metastasis. 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 Adrenal metastasis cannot be excluded]-discontinue FOLFIRINOX; declines chemotherapy-cis gem  # July 21st 2022-Lynparza 300 mg twice daily [BRCA-1 mutation-genetic mutation s/p genetic counseling ]  # July 19th 2022- Lynparza 300 mg BID ; JAN 2023- CT scan-progression of disease.  Recommend discontinuation of Lynparza.  Patient declined chemotherapy.  # NGS/MOLECULAR TESTS: BRCA-1*/genetic mutation  #BRCA1 genetic mutation-s/p genetic counseling [July 2022]    # PALLIATIVE CARE EVALUATION:  # PAIN MANAGEMENT:    DIAGNOSIS: Pancreatic acinar carcinoma  STAGE:  IV       ;  GOALS:pallaitive  CURRENT/MOST RECENT THERAPY :     Pancreatic cancer metastasized to liver ([place])  11/23/2020 Initial Diagnosis   Pancreatic cancer metastasized to liver ([place])   11/28/2020 Cancer Staging   Staging form: Exocrine Pancreas, AJCC 8th Edition - Clinical:  Stage IV (cM1) - Signed by [name], MD on 11/28/2020 Histopathologic type: Acinar cell carcinoma    12/10/2020 -  Chemotherapy    Patient is on Treatment Plan: PANCREAS MODIFIED FOLFIRINOX Q14D X 4 CYCLES        HISTORY OF PRESENTING ILLNESS:  Accompanied by wife.  Ambulating independently.  Dan [name] 56 y.o.  male with pancreatic ACINAR cell carcinoma-stage IV metastatic liver-BRCA1 mutated-on Dan [name] is here for follow-up/review results of the CT scan.  Patient complains of worsening abdominal pain.  Also complains of cramping/pain in his bilateral upper extremities especially at nighttime.  No nausea no vomiting no diarrhea.  Patient is losing weight.  However attributes to eating healthy.   Review of Systems  Constitutional:  Positive for malaise/fatigue. Negative for chills, diaphoresis and fever.  HENT:  Negative for nosebleeds.   Eyes:  Negative for double vision.  Respiratory:  Negative for cough, hemoptysis, sputum production, shortness of breath and wheezing.   Cardiovascular:  Negative for chest pain, palpitations, orthopnea and leg swelling.  Gastrointestinal:  Negative for blood in stool, constipation, diarrhea, heartburn, melena and vomiting.  Genitourinary:  Negative for dysuria, frequency and urgency.  Musculoskeletal:  Negative for back pain and joint pain.  Skin: Negative.  Negative for itching and rash.  Neurological:  Negative for dizziness, tingling, focal weakness, weakness and headaches.  Endo/Heme/Allergies:  Does not bruise/bleed easily.  Psychiatric/Behavioral:  Negative for depression. The patient is not nervous/anxious and does not have insomnia.     MEDICAL HISTORY:  Past Medical History:  Diagnosis Date   Acid reflux    Asthma    Diabetes mellitus without complication ([place])    Family history of breast cancer    History of kidney stones  Hypertension    Kidney stones     SURGICAL HISTORY: Past Surgical History:  Procedure  Laterality Date   COLONOSCOPY WITH PROPOFOL N/A 12/02/2019   Procedure: COLONOSCOPY WITH PROPOFOL;  Surgeon: [name], MD;  Location: [facility] ENDOSCOPY;  Service: Gastroenterology;  Laterality: N/A;   FRACTURE SURGERY Left at age 64   pelvic fracture   HEMORRHOID SURGERY N/A 01/31/2020   Procedure: HEMORRHOIDECTOMY;  Surgeon: Jules Husbands, MD;  Location: [facility] ORS;  Service: General;  Laterality: N/A;   IR IMAGING GUIDED PORT INSERTION  11/30/2020   pelvis fractur     ROTATOR CUFF REPAIR      SOCIAL HISTORY: Social History   Socioeconomic History   Marital status: Married    Spouse name: Not on file   Number of children: Not on file   Years of education: Not on file   Highest education level: Not on file  Occupational History   Not on file  Tobacco Use   Smoking status: Former    Packs/day: 0.50    Years: 25.00    Pack years: 12.50    Types: Cigarettes   Smokeless tobacco: Never  Vaping Use   Vaping Use: Never used  Substance and Sexual Activity   Alcohol use: Not Currently    Alcohol/week: 6.0 standard drinks    Types: 6 Cans of beer per week   Drug use: No   Sexual activity: Yes  Other Topics Concern   Not on file  Social History Narrative   Works at [employer]. In [place]. Quit smoking 2021 June. Social. Alcohol.    Social Determinants of Health   Financial Resource Strain: Not on file  Food Insecurity: Not on file  Transportation Needs: Not on file  Physical Activity: Not on file  Stress: Not on file  Social Connections: Not on file  Intimate Partner Violence: Not on file    FAMILY HISTORY: Family History  Problem Relation Age of Onset   Alzheimer's disease Mother    Heart attack Father    Cancer Sister        2 sisters 44 and 72- both died of breast cancer   Diabetes Brother    Hypertension Brother    Asthma Son     ALLERGIES:  is allergic to Gilbert [aprepitant], cherry, ibuprofen, other, and tramadol.  MEDICATIONS:  Current Outpatient Medications   Medication Sig Dispense Refill   amLODipine (NORVASC) 10 MG tablet TAKE 1 TABLET(10 MG) BY MOUTH DAILY (Patient taking differently: Take 10 mg by mouth daily.) 90 tablet 1   chlorthalidone (HYGROTON) 25 MG tablet Take 25 mg by mouth every morning.     FLOVENT HFA 110 MCG/ACT inhaler Inhale 1 puff into the lungs 2 (two) times daily.     JARDIANCE 10 MG TABS tablet Take 10 mg by mouth daily.     lidocaine-prilocaine (EMLA) cream Apply 1 application topically as needed (prior to port a cath needle access on the days of chemotherapy). 30 g 3   losartan (COZAAR) 100 MG tablet Take 100 mg by mouth daily.     LYNPARZA 150 MG tablet TAKE 2 TABLETS BY MOUTH 2 TIMES A DAY. MAY TAKE WITH FOOD TO DECREASE NAUSEA AND VOMITING. SWALLOW TABLETS WHOLE. 120 tablet 0   omeprazole (PRILOSEC) 20 MG capsule Take 20 mg by mouth 2 (two) times daily.     ondansetron (ZOFRAN) 8 MG tablet Take 1 tablet (8 mg total) by mouth every 8 (eight) hours as needed for nausea, vomiting or  refractory nausea / vomiting. 20 tablet 3   oxyCODONE-acetaminophen (PERCOCET) 7.5-325 MG tablet Take 1 tablet by mouth every 8 (eight) hours as needed for severe pain. 60 tablet 0   prochlorperazine (COMPAZINE) 10 MG tablet Take 1 tablet (10 mg total) by mouth every 6 (six) hours as needed for nausea or vomiting. 30 tablet 3   sildenafil (VIAGRA) 100 MG tablet Take 100 mg by mouth as directed.     SPIRIVA HANDIHALER 18 MCG inhalation capsule 1 capsule daily.     albuterol (PROVENTIL) (2.5 MG/3ML) 0.083% nebulizer solution Take by nebulization. (Patient not taking: Reported on 05/29/2021)     albuterol (VENTOLIN HFA) 108 (90 Base) MCG/ACT inhaler Inhale into the lungs. (Patient not taking: Reported on 05/29/2021)     amitriptyline (ELAVIL) 10 MG tablet Take 10 mg by mouth at bedtime. (Patient not taking: Reported on 09/04/2021)     magic mouthwash w/lidocaine SOLN Take 5 mLs by mouth 4 (four) times daily. 80 ml viscous lidocaine 2%, 80 ml Mylanta, 80  ml Diphenhydramine 12.5 mg/5 ml Elixir, 80 ml Nystatin 100,000 Unit suspension, 80 ml Prednisolone 15 mg/36ml, 80 ml Distilled Water. Sig: Swish/Swallow 5-10 ml four times a day as needed. Dispense 480 ml. 3RFs (Patient not taking: Reported on 06/26/2021) 480 mL 3   nystatin (MYCOSTATIN) 100000 UNIT/ML suspension Take by mouth. (Patient not taking: Reported on 06/26/2021)     oxyCODONE (OXYCONTIN) 15 mg 12 hr tablet Take 1 tablet (15 mg total) by mouth every 12 (twelve) hours. 60 tablet 0   No current facility-administered medications for this visit.      Marland Kitchen  PHYSICAL EXAMINATION: ECOG PERFORMANCE STATUS: 1 - Symptomatic but completely ambulatory  Vitals:   09/04/21 1456  BP: 126/84  Pulse: 85  Temp: 98.1 F (36.7 C)  SpO2: 99%   Filed Weights   09/04/21 1456  Weight: 202 lb 3.2 oz (91.7 kg)    Physical Exam HENT:     Head: Normocephalic and atraumatic.     Mouth/Throat:     Pharynx: No oropharyngeal exudate.  Eyes:     Pupils: Pupils are equal, round, and reactive to light.  Cardiovascular:     Rate and Rhythm: Normal rate and regular rhythm.  Pulmonary:     Effort: Pulmonary effort is normal. No respiratory distress.     Breath sounds: Normal breath sounds. No wheezing.  Abdominal:     General: Bowel sounds are normal. There is no distension.     Palpations: Abdomen is soft. There is no mass.     Tenderness: There is no abdominal tenderness. There is no guarding or rebound.  Musculoskeletal:        General: No tenderness. Normal range of motion.     Cervical back: Normal range of motion and neck supple.  Skin:    General: Skin is warm.  Neurological:     Mental Status: He is alert and oriented to person, place, and time.  Psychiatric:        Mood and Affect: Affect normal.     LABORATORY DATA:  I have reviewed the data as listed Lab Results  Component Value Date   WBC 5.8 09/04/2021   HGB 11.4 (L) 09/04/2021   HCT 34.0 (L) 09/04/2021   MCV 93.4 09/04/2021    PLT 218 09/04/2021   Recent Labs    05/29/21 0946 06/26/21 1301 08/27/21 1011 09/04/21 1439  NA 135 140  --  139  K 4.0 3.6  --  3.6  CL 101 106  --  106  CO2 27 27  --  27  GLUCOSE 150* 111*  --  106*  BUN 15 12  --  13  CREATININE 1.09 0.96 1.30* 1.06  CALCIUM 9.1 9.1  --  8.9  GFRNONAA >60 >60  --  >60  PROT 7.7 7.5  --  7.4  ALBUMIN 4.3 4.2  --  4.1  AST 27 29  --  23  ALT 24 30  --  20  ALKPHOS 65 68  --  103  BILITOT 0.9 1.0  --  0.5    RADIOGRAPHIC STUDIES: I have personally reviewed the radiological images as listed and agreed with the findings in the report. CT ABDOMEN PELVIS W CONTRAST  Result Date: 08/27/2021 CLINICAL DATA:  Pancreatic cancer, assess treatment response EXAM: CT ABDOMEN AND PELVIS WITH CONTRAST TECHNIQUE: Multidetector CT imaging of the abdomen and pelvis was performed using the standard protocol following bolus administration of intravenous contrast. CONTRAST:  125mL OMNIPAQUE IOHEXOL 300 MG/ML  SOLN COMPARISON:  05/27/2021 FINDINGS: Lower chest: No acute abnormality. Hepatobiliary: Large, heterogeneous mixed solid and cystic mass of the right lobe of the liver is increased in size, measuring 13.2 x 9.4 cm, previously approximately 11.5 x 7.4 cm when measured similarly on prior examination (series 2, image 20). No gallstones, gallbladder wall thickening, or biliary dilatation. Pancreas: Bulky exophytic mixed solid and cystic masses about the superior aspect of the pancreatic tail, splenic hilum, and greater curvature of the stomach are slightly increased in size, larger of two components laterally measuring 9.8 x 8.1 cm, previously 8.8 x 8.0 cm, and smaller component medially measuring 6.8 x 6.2 cm, previously 5.9 x 5.2 cm (series 2, image 24). There has been an increase in internal cystic character. No pancreatic ductal dilatation or surrounding inflammatory changes. Spleen: Normal in size without significant abnormality. Adrenals/Urinary Tract: Left  adrenal nodule is unchanged, measuring approximately 1.8 x 1.5 cm (series 2, image 19). Kidneys are normal, without renal calculi, solid lesion, or hydronephrosis. Bladder is unremarkable. Stomach/Bowel: Stomach is within normal limits. Appendix appears normal. No evidence of bowel wall thickening, distention, or inflammatory changes. Descending colonic diverticulosis. Vascular/Lymphatic: Aortic atherosclerosis. No enlarged abdominal or pelvic lymph nodes. Reproductive: No mass or other significant abnormality. Other: Small, fat containing bilateral inguinal hernias. No abdominopelvic ascites. Musculoskeletal: No acute or significant osseous findings. IMPRESSION: 1. Large, heterogeneous mixed solid and cystic metastatic mass of the right lobe of the liver is increased in size 2. Bulky exophytic mixed solid and cystic masses about the superior aspect of the pancreatic tail, splenic hilum, and greater curvature of the stomach are slightly increased in size. There has however has been an increase in internal cystic character, suggesting internal necrosis. 3. Unchanged left adrenal nodule. Aortic Atherosclerosis (ICD10-I70.0). Electronically Signed   By: Delanna Ahmadi M.D.   On: 08/27/2021 15:44    ASSESSMENT & PLAN:   Pancreatic cancer metastasized to liver [facility] #Pancreatic acinar cell cancer [- BRCA-1-genetic mutation] with metastasis to liver. STAGE IV- Currently Lynparza;- CT scan JAN, 3rd 2023-increase in size of the  Large, heterogeneous mixed solid and cystic metastatic mass of the right lobe of the liver [measuring approximately 13 cm previously 11cm;] also increase in size of the exophytic mixed solid and cystic masses about the superior aspect of the pancreatic tail; adrenal nodule stable.  #Given the obvious progression of disease I would recommend discontinuation of Dan [name] [pt will finish current available pills].  I recommend chemotherapy with  cisplatin-Gemcitabine-given the progressive  disease/BRCA positive-1 germline mutation.   #Wife categorically declines any form of chemotherapy; stating that she would not want her husband to go through treatments- "as chemotherapy is poison".  Patient is not interested in chemotherapy either.  Recommend a second opinion at [facility] [previous evaluation in 2022].   # Given the incurable nature of the disease /poor tolerance of therapy and in general less than 6 months of life expectancy/especially as patient not interested in further chemotherapy. I introduced hospice philosophy to the patient and family.  Discussed that goal of care should be directed to symptom management rather than treating the underlying disease; and in the process help improve quality of life rather than quantity.  Discussed with hospice team would include-nurse, nurse aide, social worker and chaplain for help take care of patient with physical/emotional needs.  We will make a hospice referral.  # Back pain- sec to malignancy/ Percocet 7.5 TID prn; . CONTINUE OXYCONTIN 15 mg BID.  Send a refill.  # IV ACCESS: no malfunction- port flush/ -stable   # BIL UE cramping: ?  Carpal tunnel syndrome-recommend elevation of the hands bilaterally/when resting.  # DISPOSITION: # referral to hospice re: pancreatic cancer #  follow up in 1 month MD; port flush labs- cbc/cmp Dr.B  # I reviewed the blood work- with the patient in detail; also reviewed the imaging independently [as summarized above]; and with the patient in detail.      All questions were answered. The patient knows to call the clinic with any problems, questions or concerns.    [name], MD 09/04/2021 3:31 PM

## 2021-09-16 ENCOUNTER — Other Ambulatory Visit: Payer: Self-pay | Admitting: *Deleted

## 2021-09-16 MED ORDER — OXYCODONE-ACETAMINOPHEN 7.5-325 MG PO TABS: 1.0000 | ORAL_TABLET | Freq: Three times a day (TID) | ORAL | 0 refills | Status: DC | PRN

## 2021-09-23 ENCOUNTER — Other Ambulatory Visit: Payer: Self-pay | Admitting: Hospice and Palliative Medicine

## 2021-09-23 MED ORDER — MORPHINE SULFATE ER 15 MG PO TBCR: 15.0000 mg | EXTENDED_RELEASE_TABLET | Freq: Two times a day (BID) | ORAL | 0 refills | Status: DC

## 2021-09-23 NOTE — Progress Notes (Signed)
I spoke with hospice nurse who requested that I rotate patient from OxyContin to MS Contin due to formulary coverage. Will send Rx.

## 2021-09-24 ENCOUNTER — Telehealth: Payer: Self-pay | Admitting: *Deleted

## 2021-09-24 ENCOUNTER — Other Ambulatory Visit: Payer: Self-pay | Admitting: Hospice and Palliative Medicine

## 2021-09-24 MED ORDER — GUAIFENESIN-CODEINE 100-10 MG/5ML PO SOLN: 5.0000 mL | Freq: Three times a day (TID) | ORAL | 0 refills | Status: DC | PRN

## 2021-09-24 MED ORDER — FENTANYL 25 MCG/HR TD PT72: 1.0000 | MEDICATED_PATCH | TRANSDERMAL | 0 refills | Status: DC

## 2021-09-24 NOTE — Telephone Encounter (Signed)
Pharmacy called stating that patient has already picked up prescription for MS ER so they cannot cancel prescription. Asking if they are to still fill prescription for Fentanyl or not. Please advise

## 2021-09-24 NOTE — Progress Notes (Signed)
I received a call from hospice nurse, Tillie Rung.  Patient/wife would prefer not to try MS Contin.  We will start him on transdermal fentanyl for long-acting pain control.  We will also send codeine-guaifenesin syrup for cough.

## 2021-09-25 ENCOUNTER — Other Ambulatory Visit: Payer: Self-pay | Admitting: *Deleted

## 2021-09-25 ENCOUNTER — Encounter: Payer: Self-pay | Admitting: Internal Medicine

## 2021-09-25 MED ORDER — GUAIFENESIN-CODEINE 100-10 MG/5ML PO SOLN: 5.0000 mL | Freq: Three times a day (TID) | ORAL | 0 refills | Status: DC | PRN

## 2021-09-25 MED ORDER — OXYCODONE HCL ER 15 MG PO T12A
15.0000 mg | EXTENDED_RELEASE_TABLET | Freq: Two times a day (BID) | ORAL | 0 refills | Status: DC
Start: 2021-09-25 — End: 2021-09-26

## 2021-09-25 NOTE — Telephone Encounter (Signed)
Pharmacy sent fax that they do not have Guaifenesin Codeine in stock, but [facility] has it and will need a new prescription sent there

## 2021-09-25 NOTE — Telephone Encounter (Signed)
Hospice has agreed to cover cost of OxyContin. Will d/c previous prescriptions for MS Contin and Fentanyl and refill OxyContin.

## 2021-09-25 NOTE — Telephone Encounter (Signed)
Per Sharion Dove, NP, he spoke with [facility] hospice nurse and patient had changef his mind about the Fentanyl patches and doe snot want them.

## 2021-09-25 NOTE — Telephone Encounter (Signed)
Dan [name] called stating after discussing with Supervisor that Hospice will cover Oxycontin 15 mg twice a day and prescription needs to be sent to [facility]. Also wife wants the cough medicine to go to [facility]. I have called [facility] and cancelled prescription sent there

## 2021-09-26 ENCOUNTER — Other Ambulatory Visit: Payer: Self-pay | Admitting: Hospice and Palliative Medicine

## 2021-09-26 MED ORDER — OXYCODONE HCL ER 15 MG PO T12A: 15.0000 mg | EXTENDED_RELEASE_TABLET | Freq: Three times a day (TID) | ORAL | 0 refills | Status: DC

## 2021-09-26 NOTE — Progress Notes (Signed)
I received a call from hospice nurse Tillie Rung stating that patient has been taking the OxyContin every 8 hours and she requested a new prescription to reflect this change.

## 2021-10-02 ENCOUNTER — Inpatient Hospital Stay (HOSPITAL_BASED_OUTPATIENT_CLINIC_OR_DEPARTMENT_OTHER): Payer: BLUE CROSS/BLUE SHIELD | Admitting: Internal Medicine

## 2021-10-02 ENCOUNTER — Other Ambulatory Visit: Payer: Self-pay

## 2021-10-02 ENCOUNTER — Inpatient Hospital Stay: Payer: BLUE CROSS/BLUE SHIELD | Attending: Internal Medicine

## 2021-10-02 ENCOUNTER — Encounter: Payer: Self-pay | Admitting: Internal Medicine

## 2021-10-02 DIAGNOSIS — Z87442 Personal history of urinary calculi: Secondary | ICD-10-CM | POA: Insufficient documentation

## 2021-10-02 DIAGNOSIS — E119 Type 2 diabetes mellitus without complications: Secondary | ICD-10-CM | POA: Diagnosis not present

## 2021-10-02 DIAGNOSIS — G5600 Carpal tunnel syndrome, unspecified upper limb: Secondary | ICD-10-CM | POA: Diagnosis not present

## 2021-10-02 DIAGNOSIS — Z818 Family history of other mental and behavioral disorders: Secondary | ICD-10-CM | POA: Insufficient documentation

## 2021-10-02 DIAGNOSIS — I10 Essential (primary) hypertension: Secondary | ICD-10-CM | POA: Diagnosis not present

## 2021-10-02 DIAGNOSIS — Z885 Allergy status to narcotic agent status: Secondary | ICD-10-CM | POA: Insufficient documentation

## 2021-10-02 DIAGNOSIS — Z8719 Personal history of other diseases of the digestive system: Secondary | ICD-10-CM | POA: Diagnosis not present

## 2021-10-02 DIAGNOSIS — Z803 Family history of malignant neoplasm of breast: Secondary | ICD-10-CM | POA: Diagnosis not present

## 2021-10-02 DIAGNOSIS — C252 Malignant neoplasm of tail of pancreas: Secondary | ICD-10-CM | POA: Insufficient documentation

## 2021-10-02 DIAGNOSIS — Z87891 Personal history of nicotine dependence: Secondary | ICD-10-CM | POA: Diagnosis not present

## 2021-10-02 DIAGNOSIS — Z79899 Other long term (current) drug therapy: Secondary | ICD-10-CM | POA: Insufficient documentation

## 2021-10-02 DIAGNOSIS — C787 Secondary malignant neoplasm of liver and intrahepatic bile duct: Secondary | ICD-10-CM | POA: Diagnosis not present

## 2021-10-02 DIAGNOSIS — R5383 Other fatigue: Secondary | ICD-10-CM | POA: Diagnosis not present

## 2021-10-02 DIAGNOSIS — C259 Malignant neoplasm of pancreas, unspecified: Secondary | ICD-10-CM

## 2021-10-02 DIAGNOSIS — Z91018 Allergy to other foods: Secondary | ICD-10-CM | POA: Insufficient documentation

## 2021-10-02 DIAGNOSIS — R110 Nausea: Secondary | ICD-10-CM | POA: Insufficient documentation

## 2021-10-02 DIAGNOSIS — Z888 Allergy status to other drugs, medicaments and biological substances status: Secondary | ICD-10-CM | POA: Diagnosis not present

## 2021-10-02 DIAGNOSIS — Z886 Allergy status to analgesic agent status: Secondary | ICD-10-CM | POA: Insufficient documentation

## 2021-10-02 DIAGNOSIS — Z8249 Family history of ischemic heart disease and other diseases of the circulatory system: Secondary | ICD-10-CM | POA: Insufficient documentation

## 2021-10-02 DIAGNOSIS — Z833 Family history of diabetes mellitus: Secondary | ICD-10-CM | POA: Diagnosis not present

## 2021-10-02 DIAGNOSIS — Z836 Family history of other diseases of the respiratory system: Secondary | ICD-10-CM | POA: Insufficient documentation

## 2021-10-02 LAB — CBC WITH DIFFERENTIAL/PLATELET
Abs Immature Granulocytes: 0.02 10*3/uL (ref 0.00–0.07)
Basophils Absolute: 0 10*3/uL (ref 0.0–0.1)
Basophils Relative: 1 %
Eosinophils Absolute: 0.1 10*3/uL (ref 0.0–0.5)
Eosinophils Relative: 2 %
HCT: 34.6 % — ABNORMAL LOW (ref 39.0–52.0)
Hemoglobin: 11.7 g/dL — ABNORMAL LOW (ref 13.0–17.0)
Immature Granulocytes: 0 %
Lymphocytes Relative: 24 %
Lymphs Abs: 1.5 10*3/uL (ref 0.7–4.0)
MCH: 31.1 pg (ref 26.0–34.0)
MCHC: 33.8 g/dL (ref 30.0–36.0)
MCV: 92 fL (ref 80.0–100.0)
Monocytes Absolute: 0.5 10*3/uL (ref 0.1–1.0)
Monocytes Relative: 8 %
Neutro Abs: 4.1 10*3/uL (ref 1.7–7.7)
Neutrophils Relative %: 65 %
Platelets: 199 10*3/uL (ref 150–400)
RBC: 3.76 MIL/uL — ABNORMAL LOW (ref 4.22–5.81)
RDW: 14 % (ref 11.5–15.5)
WBC: 6.3 10*3/uL (ref 4.0–10.5)
nRBC: 0 % (ref 0.0–0.2)

## 2021-10-02 LAB — COMPREHENSIVE METABOLIC PANEL
ALT: 16 U/L (ref 0–44)
AST: 27 U/L (ref 15–41)
Albumin: 4.2 g/dL (ref 3.5–5.0)
Alkaline Phosphatase: 121 U/L (ref 38–126)
Anion gap: 8 (ref 5–15)
BUN: 10 mg/dL (ref 6–20)
CO2: 26 mmol/L (ref 22–32)
Calcium: 9.4 mg/dL (ref 8.9–10.3)
Chloride: 107 mmol/L (ref 98–111)
Creatinine, Ser: 0.98 mg/dL (ref 0.61–1.24)
GFR, Estimated: >60 mL/min (ref >60)
Glucose, Bld: 106 mg/dL — ABNORMAL HIGH (ref 70–99)
Potassium: 3.9 mmol/L (ref 3.5–5.1)
Sodium: 141 mmol/L (ref 135–145)
Total Bilirubin: 0.4 mg/dL (ref 0.3–1.2)
Total Protein: 7.9 g/dL (ref 6.5–8.1)

## 2021-10-02 NOTE — Progress Notes (Signed)
[facility] CONSULT NOTE  Patient Care Team: Tester, Baker Pierini as PCP - General (Physician Assistant) Clent Jacks, RN as Oncology Nurse Navigator [name], MD as Consulting Physician (Internal Medicine) Tester, Baker Pierini as Physician Assistant (Physician Assistant)  CHIEF COMPLAINTS/PURPOSE OF CONSULTATION: Pancreatic cancer   Oncology History Overview Note  # April 2022- [name],  BIOPSY:  - METASTATIC PANCREATIC ACINAR CELL CARCINOMA.; STAGE IV; CT scan shows-approximately 8cm centimeter pancreatic mass; adrenal lesion; also approximately 6 cm liver lesion.   ; LIPASE NORMAL.   # April 18ht, 2022- FOLFIRINOX; udenyca x4 4 cycles-June 2022-progressive disease noted-[S/p palliative chemotherapy with FOLFIRINOX s/p 4 cyc;es-June 23rd- 2022-INCREASE in size of large pancreatic tail mass, and adjacent peripancreatic lymphadenopathy. Increased size of large complex cystic and solid mass in right hepatic lobe, consistent with hepatic metastasis. 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 Adrenal metastasis cannot be excluded]-discontinue FOLFIRINOX; declines chemotherapy-cis gem  # July 21st 2022-Lynparza 300 mg twice daily [BRCA-1 mutation-genetic mutation s/p genetic counseling ]  # July 19th 2022- Lynparza 300 mg BID ; JAN 2023- CT scan-progression of disease.  Recommend discontinuation of Lynparza.  Patient declined chemotherapy.  # NGS/MOLECULAR TESTS: BRCA-1*/genetic mutation  #BRCA1 genetic mutation-s/p genetic counseling [July 2022]    # PALLIATIVE CARE EVALUATION:  # PAIN MANAGEMENT:    DIAGNOSIS: Pancreatic acinar carcinoma  STAGE:  IV       ;  GOALS:pallaitive  CURRENT/MOST RECENT THERAPY :     Pancreatic cancer metastasized to liver ([place])  11/23/2020 Initial Diagnosis   Pancreatic cancer metastasized to liver ([place])   11/28/2020 Cancer Staging   Staging form: Exocrine Pancreas,  AJCC 8th Edition - Clinical: Stage IV (cM1) - Signed by [name], MD on 11/28/2020 Histopathologic type: Acinar cell carcinoma    12/10/2020 -  Chemotherapy    Patient is on Treatment Plan: PANCREAS MODIFIED FOLFIRINOX Q14D X 4 CYCLES        HISTORY OF PRESENTING ILLNESS:  Alone.   Ambulating independently.  Dan [name] 56 y.o.  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  Patient continues to take/finish off the remaining refills of his [place]  He was asked to discontinue Lynparza at last visit/1 month ago because of progressive disease.  Patient states that his abdominal pain is improved since increasing the dose of OxyContin to 3 times daily.  Complains of intermittent nausea for which he takes nausea medications.  No nausea no vomiting no diarrhea.  Possible weight loss-which he attributes to eating healthy.  Review of Systems  Constitutional:  Positive for malaise/fatigue. Negative for chills, diaphoresis and fever.  HENT:  Negative for nosebleeds.   Eyes:  Negative for double vision.  Respiratory:  Negative for cough, hemoptysis, sputum production, shortness of breath and wheezing.   Cardiovascular:  Negative for chest pain, palpitations, orthopnea and leg swelling.  Gastrointestinal:  Negative for blood in stool, constipation, diarrhea, heartburn, melena and vomiting.  Genitourinary:  Negative for dysuria, frequency and urgency.  Musculoskeletal:  Negative for back pain and joint pain.  Skin: Negative.  Negative for itching and rash.  Neurological:  Negative for dizziness, tingling, focal weakness, weakness and headaches.  Endo/Heme/Allergies:  Does not bruise/bleed easily.  Psychiatric/Behavioral:  Negative for depression. The patient is not nervous/anxious and does not have insomnia.     MEDICAL HISTORY:  Past Medical  History:  Diagnosis Date   Acid reflux    Asthma     Diabetes mellitus without complication ([place])    Family history of breast cancer    History of kidney stones    Hypertension    Kidney stones     SURGICAL HISTORY: Past Surgical History:  Procedure Laterality Date   COLONOSCOPY WITH PROPOFOL N/A 12/02/2019   Procedure: COLONOSCOPY WITH PROPOFOL;  Surgeon: [name], MD;  Location: [facility] ENDOSCOPY;  Service: Gastroenterology;  Laterality: N/A;   FRACTURE SURGERY Left at age 75   pelvic fracture   HEMORRHOID SURGERY N/A 01/31/2020   Procedure: HEMORRHOIDECTOMY;  Surgeon: Jules Husbands, MD;  Location: [facility] ORS;  Service: General;  Laterality: N/A;   IR IMAGING GUIDED PORT INSERTION  11/30/2020   pelvis fractur     ROTATOR CUFF REPAIR      SOCIAL HISTORY: Social History   Socioeconomic History   Marital status: Married    Spouse name: Not on file   Number of children: Not on file   Years of education: Not on file   Highest education level: Not on file  Occupational History   Not on file  Tobacco Use   Smoking status: Former    Packs/day: 0.50    Years: 25.00    Pack years: 12.50    Types: Cigarettes   Smokeless tobacco: Never  Vaping Use   Vaping Use: Never used  Substance and Sexual Activity   Alcohol use: Not Currently    Alcohol/week: 6.0 standard drinks    Types: 6 Cans of beer per week   Drug use: No   Sexual activity: Yes  Other Topics Concern   Not on file  Social History Narrative   Works at [employer]. In [place]. Quit smoking 2021 June. Social. Alcohol.    Social Determinants of Health   Financial Resource Strain: Not on file  Food Insecurity: Not on file  Transportation Needs: Not on file  Physical Activity: Not on file  Stress: Not on file  Social Connections: Not on file  Intimate Partner Violence: Not on file    FAMILY HISTORY: Family History  Problem Relation Age of Onset   Alzheimer's disease Mother    Heart attack Father    Cancer Sister        2 sisters 40 and 64- both died of breast cancer    Diabetes Brother    Hypertension Brother    Asthma Son     ALLERGIES:  is allergic to [place] [aprepitant], cherry, ibuprofen, other, and tramadol.  MEDICATIONS:  Current Outpatient Medications  Medication Sig Dispense Refill   amLODipine (NORVASC) 10 MG tablet TAKE 1 TABLET(10 MG) BY MOUTH DAILY (Patient taking differently: Take 10 mg by mouth daily.) 90 tablet 1   chlorthalidone (HYGROTON) 25 MG tablet Take 25 mg by mouth every morning.     FLOVENT HFA 110 MCG/ACT inhaler Inhale 1 puff into the lungs 2 (two) times daily.     JARDIANCE 10 MG TABS tablet Take 10 mg by mouth daily.     lidocaine-prilocaine (EMLA) cream Apply 1 application topically as needed (prior to port a cath needle access on the days of chemotherapy). 30 g 3   losartan (COZAAR) 100 MG tablet Take 100 mg by mouth daily.     LYNPARZA 150 MG tablet TAKE 2 TABLETS BY MOUTH 2 TIMES A DAY. MAY TAKE WITH FOOD TO DECREASE NAUSEA AND VOMITING. SWALLOW TABLETS WHOLE. 120 tablet 0   omeprazole (PRILOSEC) 20  MG capsule Take 20 mg by mouth 2 (two) times daily.     ondansetron (ZOFRAN) 8 MG tablet Take 1 tablet (8 mg total) by mouth every 8 (eight) hours as needed for nausea, vomiting or refractory nausea / vomiting. 20 tablet 3   oxyCODONE (OXYCONTIN) 15 mg 12 hr tablet Take 1 tablet (15 mg total) by mouth every 8 (eight) hours. 90 tablet 0   oxyCODONE-acetaminophen (PERCOCET) 7.5-325 MG tablet Take 1 tablet by mouth every 8 (eight) hours as needed for severe pain. 60 tablet 0   prochlorperazine (COMPAZINE) 10 MG tablet Take 1 tablet (10 mg total) by mouth every 6 (six) hours as needed for nausea or vomiting. 30 tablet 3   sildenafil (VIAGRA) 100 MG tablet Take 100 mg by mouth as directed.     SPIRIVA HANDIHALER 18 MCG inhalation capsule 1 capsule daily.     albuterol (PROVENTIL) (2.5 MG/3ML) 0.083% nebulizer solution Take by nebulization. (Patient not taking: Reported on 05/29/2021)     albuterol (VENTOLIN HFA) 108 (90 Base) MCG/ACT  inhaler Inhale into the lungs. (Patient not taking: Reported on 05/29/2021)     amitriptyline (ELAVIL) 10 MG tablet Take 10 mg by mouth at bedtime. (Patient not taking: Reported on 09/04/2021)     guaiFENesin-codeine 100-10 MG/5ML syrup Take 5 mLs by mouth 3 (three) times daily as needed for cough. (Patient not taking: Reported on 10/02/2021) 120 mL 0   magic mouthwash w/lidocaine SOLN Take 5 mLs by mouth 4 (four) times daily. 80 ml viscous lidocaine 2%, 80 ml Mylanta, 80 ml Diphenhydramine 12.5 mg/5 ml Elixir, 80 ml Nystatin 100,000 Unit suspension, 80 ml Prednisolone 15 mg/55ml, 80 ml Distilled Water. Sig: Swish/Swallow 5-10 ml four times a day as needed. Dispense 480 ml. 3RFs (Patient not taking: Reported on 06/26/2021) 480 mL 3   nystatin (MYCOSTATIN) 100000 UNIT/ML suspension Take by mouth. (Patient not taking: Reported on 06/26/2021)     No current facility-administered medications for this visit.      Marland Kitchen  PHYSICAL EXAMINATION: ECOG PERFORMANCE STATUS: 1 - Symptomatic but completely ambulatory  Vitals:   10/02/21 1500  BP: 125/90  Pulse: 87  Temp: 98.9 F (37.2 C)   Filed Weights   10/02/21 1500  Weight: 192 lb 3.2 oz (87.2 kg)    Physical Exam HENT:     Head: Normocephalic and atraumatic.     Mouth/Throat:     Pharynx: No oropharyngeal exudate.  Eyes:     Pupils: Pupils are equal, round, and reactive to light.  Cardiovascular:     Rate and Rhythm: Normal rate and regular rhythm.  Pulmonary:     Effort: Pulmonary effort is normal. No respiratory distress.     Breath sounds: Normal breath sounds. No wheezing.  Abdominal:     General: Bowel sounds are normal. There is no distension.     Palpations: Abdomen is soft. There is no mass.     Tenderness: There is no abdominal tenderness. There is no guarding or rebound.  Musculoskeletal:        General: No tenderness. Normal range of motion.     Cervical back: Normal range of motion and neck supple.  Skin:    General: Skin is  warm.  Neurological:     Mental Status: He is alert and oriented to person, place, and time.  Psychiatric:        Mood and Affect: Affect normal.     LABORATORY DATA:  I have reviewed the data as listed Lab  Results  Component Value Date   WBC 6.3 10/02/2021   HGB 11.7 (L) 10/02/2021   HCT 34.6 (L) 10/02/2021   MCV 92.0 10/02/2021   PLT 199 10/02/2021   Recent Labs    06/26/21 1301 08/27/21 1011 09/04/21 1439 10/02/21 1439  NA 140  --  139 141  K 3.6  --  3.6 3.9  CL 106  --  106 107  CO2 27  --  27 26  GLUCOSE 111*  --  106* 106*  BUN 12  --  13 10  CREATININE 0.96 1.30* 1.06 0.98  CALCIUM 9.1  --  8.9 9.4  GFRNONAA >60  --  >60 >60  PROT 7.5  --  7.4 7.9  ALBUMIN 4.2  --  4.1 4.2  AST 29  --  23 27  ALT 30  --  20 16  ALKPHOS 68  --  103 121  BILITOT 1.0  --  0.5 0.4    RADIOGRAPHIC STUDIES: I have personally reviewed the radiological images as listed and agreed with the findings in the report. No results found.  ASSESSMENT & PLAN:   Pancreatic cancer metastasized to liver [facility]) #Pancreatic acinar cell cancer [- BRCA-1-genetic mutation] with metastasis to liver. STAGE IV- Currently Lynparza;- CT scan JAN, 3rd 2023-increase in size of the  Large, heterogeneous mixed solid and cystic metastatic mass of the right lobe of the liver [measuring approximately 13 cm previously 11cm;] also increase in size of the exophytic mixed solid and cystic masses about the superior aspect of the pancreatic tail; adrenal nodule stable.   # Currently under hospice as patient declined any chemotherapy options. Wants to finish off the lynparza available to him. No further refills will be provided as pt is in hospice.  Patient will continue hospice.  # weight loss- 10 pounds [ attributes to eating health] versus disease progression.  # Nausea- anti-emetics as needed 1-2 twice a month.  # Back pain- sec to malignancy/ Percocet 7.5 TID prn; . CONTINUE OXYCONTIN 15 mg TID; STABLE.  l.  # IV ACCESS: no malfunction- port flush/ -stable  # BIL UE cramping: ?  Carpal tunnel syndrome-recommend elevation of the hands bilaterally/when resting.  # DISPOSITION:mychart appt #  follow up in 42month MD; port flush labs- cbc/cmp Dr.B     All questions were answered. The patient knows to call the clinic with any problems, questions or concerns.    [name], MD 10/03/2021 6:58 PM

## 2021-10-02 NOTE — Progress Notes (Signed)
Patient has a 10 lb wt loss since last documented wt on 09/04/2021.  Appetite has decreased but makes sure to eat breakfast and dinner with some snack/fruit between.  Does have occasional nausea that is relieved with prescribed medication.  For the past few months he has bilateral hands are swollen and cramping in the morning that does improve during the day.

## 2021-10-02 NOTE — Assessment & Plan Note (Addendum)
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 STAGE IV- Currently Lynparza;- CT scan JAN, 3rd 2023-increase in size of the  Large, heterogeneous mixed solid and cystic metastatic mass of the right lobe of the liver [measuring approximately 13 cm previously 11cm;] also increase in size of the exophytic mixed solid and cystic masses about the superior aspect of the pancreatic tail; adrenal nodule stable.   # Currently under hospice as patient declined any chemotherapy options. Wants to finish off the lynparza available to him. No further refills will be provided as pt is in hospice.  Patient will continue hospice.  # weight loss- 10 pounds [ attributes to eating health] versus disease progression.  # Nausea- anti-emetics as needed 1-2 twice a month.  # Back pain- sec to malignancy/ Percocet 7.5 TID prn; . CONTINUE OXYCONTIN 15 mg TID; STABLE. l.  # IV ACCESS: no malfunction- port flush/ -stable  # BIL UE cramping: ?  Carpal tunnel syndrome-recommend elevation of the hands bilaterally/when resting.  # DISPOSITION:mychart appt #  follow up in 22month MD; port flush labs- cbc/cmp Dr.B

## 2021-10-03 ENCOUNTER — Encounter: Payer: Self-pay | Admitting: Internal Medicine

## 2021-10-08 ENCOUNTER — Ambulatory Visit: Payer: BLUE CROSS/BLUE SHIELD | Admitting: Internal Medicine

## 2021-10-10 ENCOUNTER — Other Ambulatory Visit: Payer: Self-pay | Admitting: Hospice and Palliative Medicine

## 2021-10-10 ENCOUNTER — Ambulatory Visit: Payer: BLUE CROSS/BLUE SHIELD | Admitting: Internal Medicine

## 2021-10-10 MED ORDER — OXYCODONE HCL ER 15 MG PO T12A: 15.0000 mg | EXTENDED_RELEASE_TABLET | Freq: Three times a day (TID) | ORAL | 0 refills | Status: DC

## 2021-10-10 NOTE — Progress Notes (Signed)
Dan [name] with hospice requested refill of OxyContin

## 2021-10-14 ENCOUNTER — Ambulatory Visit: Payer: BLUE CROSS/BLUE SHIELD | Admitting: Internal Medicine

## 2021-10-14 ENCOUNTER — Encounter: Payer: Self-pay | Admitting: Internal Medicine

## 2021-10-14 ENCOUNTER — Other Ambulatory Visit: Payer: Self-pay

## 2021-10-14 VITALS — BP 107/71 | HR 75 | Temp 98.6°F | Ht 72.01 in | Wt 193.0 lb

## 2021-10-14 DIAGNOSIS — K8689 Other specified diseases of pancreas: Secondary | ICD-10-CM

## 2021-10-14 DIAGNOSIS — C259 Malignant neoplasm of pancreas, unspecified: Secondary | ICD-10-CM

## 2021-10-14 DIAGNOSIS — E119 Type 2 diabetes mellitus without complications: Secondary | ICD-10-CM

## 2021-10-14 DIAGNOSIS — I10 Essential (primary) hypertension: Secondary | ICD-10-CM | POA: Diagnosis not present

## 2021-10-14 MED ORDER — JARDIANCE 10 MG PO TABS
10.0000 mg | ORAL_TABLET | Freq: Every day | ORAL | 2 refills | Status: DC
Start: 2021-10-14 — End: 2021-12-12

## 2021-10-14 MED ORDER — LOSARTAN POTASSIUM 100 MG PO TABS: 100.0000 mg | ORAL_TABLET | Freq: Every day | ORAL | 1 refills | Status: DC

## 2021-10-14 MED ORDER — AMLODIPINE BESYLATE 10 MG PO TABS: 10.0000 mg | ORAL_TABLET | Freq: Every day | ORAL | 3 refills | Status: DC

## 2021-10-14 NOTE — Progress Notes (Signed)
BP 107/71    Pulse 75    Temp 98.6 F (37 C) (Oral)    Ht 6' 0.01" (1.829 m)    Wt 193 lb (87.5 kg)    SpO2 97%    BMI 26.17 kg/m    Subjective:    Patient ID: Dan [name], male    DOB: 07/09/1966, 56 y.o.   MRN: [id number]  Chief Complaint  Patient presents with   New Patient (Initial Visit)    HPI: Dan [name] is a 56 y.o. male  Pt is here to establish care. Last  pcp @ [facility]. Had a fall out with his last pcp.    Has a ho pancreatic cancer with mets to liver doesn't want to take chemo and has had this in the past 6 chemo cycles makes his back itch. Pt is on hospice care.  Hypertension This is a chronic problem. The current episode started more than 1 month ago. The problem is controlled. Pertinent negatives include no anxiety, blurred vision, chest pain, headaches, malaise/fatigue, neck pain, orthopnea, palpitations, peripheral edema, PND, shortness of breath or sweats.  Diabetes He presents for his initial (is on jardiance last a1c -) diabetic visit. He has type 2 diabetes mellitus. His disease course has been worsening. Pertinent negatives for hypoglycemia include no headaches or sweats. Pertinent negatives for diabetes include no blurred vision, no chest pain, no fatigue, no foot paresthesias, no foot ulcerations, no polydipsia, no polyphagia, no visual change, no weakness and no weight loss.   Chief Complaint  Patient presents with   New Patient (Initial Visit)    Relevant past medical, surgical, family and social history reviewed and updated as indicated. Interim medical history since our last visit reviewed. Allergies and medications reviewed and updated.  Review of Systems  Constitutional:  Negative for fatigue, malaise/fatigue and weight loss.  Eyes:  Negative for blurred vision.  Respiratory:  Negative for shortness of breath.   Cardiovascular:  Negative for chest pain, palpitations, orthopnea and PND.  Endocrine: Negative for polydipsia and polyphagia.   Musculoskeletal:  Negative for neck pain.  Neurological:  Negative for weakness and headaches.   Per HPI unless specifically indicated above     Objective:    BP 107/71    Pulse 75    Temp 98.6 F (37 C) (Oral)    Ht 6' 0.01" (1.829 m)    Wt 193 lb (87.5 kg)    SpO2 97%    BMI 26.17 kg/m   Wt Readings from Last 3 Encounters:  10/14/21 193 lb (87.5 kg)  10/02/21 192 lb 3.2 oz (87.2 kg)  09/04/21 202 lb 3.2 oz (91.7 kg)    Physical Exam  Results for orders placed or performed in visit on 10/02/21  Comprehensive metabolic panel  Result Value Ref Range   Sodium 141 135 - 145 mmol/L   Potassium 3.9 3.5 - 5.1 mmol/L   Chloride 107 98 - 111 mmol/L   CO2 26 22 - 32 mmol/L   Glucose, Bld 106 (H) 70 - 99 mg/dL   BUN 10 6 - 20 mg/dL   Creatinine, Ser 0.98 0.61 - 1.24 mg/dL   Calcium 9.4 8.9 - 10.3 mg/dL   Total Protein 7.9 6.5 - 8.1 g/dL   Albumin 4.2 3.5 - 5.0 g/dL   AST 27 15 - 41 U/L   ALT 16 0 - 44 U/L   Alkaline Phosphatase 121 38 - 126 U/L   Total Bilirubin 0.4 0.3 - 1.2 mg/dL  GFR, Estimated >60 >60 mL/min   Anion gap 8 5 - 15  CBC with Differential  Result Value Ref Range   WBC 6.3 4.0 - 10.5 K/uL   RBC 3.76 (L) 4.22 - 5.81 MIL/uL   Hemoglobin 11.7 (L) 13.0 - 17.0 g/dL   HCT 34.6 (L) 39.0 - 52.0 %   MCV 92.0 80.0 - 100.0 fL   MCH 31.1 26.0 - 34.0 pg   MCHC 33.8 30.0 - 36.0 g/dL   RDW 14.0 11.5 - 15.5 %   Platelets 199 150 - 400 K/uL   nRBC 0.0 0.0 - 0.2 %   Neutrophils Relative % 65 %   Neutro Abs 4.1 1.7 - 7.7 K/uL   Lymphocytes Relative 24 %   Lymphs Abs 1.5 0.7 - 4.0 K/uL   Monocytes Relative 8 %   Monocytes Absolute 0.5 0.1 - 1.0 K/uL   Eosinophils Relative 2 %   Eosinophils Absolute 0.1 0.0 - 0.5 K/uL   Basophils Relative 1 %   Basophils Absolute 0.0 0.0 - 0.1 K/uL   Immature Granulocytes 0 %   Abs Immature Granulocytes 0.02 0.00 - 0.07 K/uL        Current Outpatient Medications:    albuterol (PROVENTIL) (2.5 MG/3ML) 0.083% nebulizer solution,  Take by nebulization., Disp: , Rfl:    albuterol (VENTOLIN HFA) 108 (90 Base) MCG/ACT inhaler, Inhale into the lungs., Disp: , Rfl:    amLODipine (NORVASC) 10 MG tablet, Take 1 tablet (10 mg total) by mouth daily., Disp: 90 tablet, Rfl: 3   FLOVENT HFA 110 MCG/ACT inhaler, Inhale 1 puff into the lungs 2 (two) times daily., Disp: , Rfl:    guaiFENesin-codeine 100-10 MG/5ML syrup, Take 5 mLs by mouth 3 (three) times daily as needed for cough., Disp: 120 mL, Rfl: 0   lidocaine-prilocaine (EMLA) cream, Apply 1 application topically as needed (prior to port a cath needle access on the days of chemotherapy)., Disp: 30 g, Rfl: 3   omeprazole (PRILOSEC) 20 MG capsule, Take 20 mg by mouth 2 (two) times daily., Disp: , Rfl:    ondansetron (ZOFRAN) 8 MG tablet, Take 1 tablet (8 mg total) by mouth every 8 (eight) hours as needed for nausea, vomiting or refractory nausea / vomiting., Disp: 20 tablet, Rfl: 3   oxyCODONE (OXYCONTIN) 15 mg 12 hr tablet, Take 1 tablet (15 mg total) by mouth every 8 (eight) hours., Disp: 45 tablet, Rfl: 0   oxyCODONE-acetaminophen (PERCOCET) 7.5-325 MG tablet, Take 1 tablet by mouth every 8 (eight) hours as needed for severe pain., Disp: 60 tablet, Rfl: 0   prochlorperazine (COMPAZINE) 10 MG tablet, Take 1 tablet (10 mg total) by mouth every 6 (six) hours as needed for nausea or vomiting., Disp: 30 tablet, Rfl: 3   sildenafil (VIAGRA) 100 MG tablet, Take 100 mg by mouth as directed., Disp: , Rfl:    SPIRIVA HANDIHALER 18 MCG inhalation capsule, 1 capsule daily., Disp: , Rfl:    JARDIANCE 10 MG TABS tablet, Take 1 tablet (10 mg total) by mouth daily., Disp: 30 tablet, Rfl: 2   losartan (COZAAR) 100 MG tablet, Take 1 tablet (100 mg total) by mouth daily., Disp: 90 tablet, Rfl: 1    Assessment & Plan:  Htn HTN :  Continue current meds.  Medication compliance emphasised. pt advised to keep Bp logs. Pt verbalised understanding of the same. Pt to have a low salt diet .  Exercise to reach a goal of at least 150 mins a week.  lifestyle modifications explained and pt understands importance of the above. Under good control on current regimen. Continue current regimen. Continue to monitor. Call with any concerns. Refills given. Labs drawn today.   2. DM is on jardiacne for such  check HbA1c,  urine  microalbumin  diabetic diet plan given to pt  adviced regarding hypoglycemia and instructions given to pt today on how to prevent and treat the same if it were to occur. pt acknowledges the plan and voices understanding of the same.  exercise plan given and encouraged.   advice diabetic yearly podiatry, ophthalmology , nutritionist , dental check q 6 months,   3. Pancreatic cancer wants a second opinion needs to see a heme onc physician doenst want to go back to Dr. Jacinto Reap  New referral placed.   4. Ex smoker ? Copd vs asthma  - ois on spiriva and albuterol as needed.   5. Pain mx is on hospice care is oxytin 15 mg every 8 hrs and percocet q 8 hrs as needed   Problem List Items Addressed This Visit       Cardiovascular and Mediastinum   HTN (hypertension)   Relevant Medications   amLODipine (NORVASC) 10 MG tablet   losartan (COZAAR) 100 MG tablet   Other Relevant Orders   CBC with Differential/Platelet   Comprehensive metabolic panel   Lipid panel   Urinalysis, Routine w reflex microscopic   Bayer DCA Hb A1c Waived (STAT)     Digestive   Malignant neoplasm of pancreas (HCC) - Primary   Relevant Orders   Ambulatory referral to Hematology / Oncology   CBC with Differential/Platelet   Comprehensive metabolic panel   Lipid panel   Urinalysis, Routine w reflex microscopic   Bayer DCA Hb A1c Waived (STAT)     Endocrine   Type 2 diabetes mellitus without complication (HCC)   Relevant Medications   losartan (COZAAR) 100 MG tablet   JARDIANCE 10 MG TABS tablet     Other   Pancreatic mass     Orders Placed This Encounter  Procedures   CBC with  Differential/Platelet   Comprehensive metabolic panel   Lipid panel   Urinalysis, Routine w reflex microscopic   Bayer DCA Hb A1c Waived (STAT)   Ambulatory referral to Hematology / Oncology     Meds ordered this encounter  Medications   amLODipine (NORVASC) 10 MG tablet    Sig: Take 1 tablet (10 mg total) by mouth daily.    Dispense:  90 tablet    Refill:  3   losartan (COZAAR) 100 MG tablet    Sig: Take 1 tablet (100 mg total) by mouth daily.    Dispense:  90 tablet    Refill:  1   JARDIANCE 10 MG TABS tablet    Sig: Take 1 tablet (10 mg total) by mouth daily.    Dispense:  30 tablet    Refill:  2     Follow up plan: Return in about 2 months (around 12/12/2021).

## 2021-10-15 ENCOUNTER — Telehealth: Payer: Self-pay

## 2021-10-15 NOTE — Telephone Encounter (Signed)
Referral received from PCP for second opinion regarding his pancreatic cancer. Currently under hospice services. Contacted Dan [name] and he would like to discuss this with his wife before pursuing opinion with another physician at this practice. I have provided my phone number for him to call once he decides.

## 2021-10-18 ENCOUNTER — Other Ambulatory Visit: Payer: Self-pay | Admitting: Hospice and Palliative Medicine

## 2021-10-18 MED ORDER — OXYCODONE-ACETAMINOPHEN 7.5-325 MG PO TABS: 1.0000 | ORAL_TABLET | Freq: Three times a day (TID) | ORAL | 0 refills | Status: DC | PRN

## 2021-10-18 NOTE — Progress Notes (Signed)
Hospice nurse, Tillie Rung, requested refill of Percocet. Rx sent to pharmacy.

## 2021-10-23 ENCOUNTER — Other Ambulatory Visit (HOSPITAL_COMMUNITY): Payer: Self-pay

## 2021-10-25 ENCOUNTER — Other Ambulatory Visit: Payer: Self-pay | Admitting: Hospice and Palliative Medicine

## 2021-10-25 MED ORDER — OXYCODONE HCL ER 15 MG PO T12A: 15.0000 mg | EXTENDED_RELEASE_TABLET | Freq: Three times a day (TID) | ORAL | 0 refills | Status: DC

## 2021-10-25 NOTE — Progress Notes (Signed)
Hospice requested refill on OxyContin ?

## 2021-11-05 ENCOUNTER — Other Ambulatory Visit: Payer: Self-pay | Admitting: Hospice and Palliative Medicine

## 2021-11-05 MED ORDER — OXYCODONE-ACETAMINOPHEN 7.5-325 MG PO TABS: 1.0000 | ORAL_TABLET | ORAL | 0 refills | Status: DC | PRN

## 2021-11-05 MED ORDER — OXYCODONE HCL ER 15 MG PO T12A: 15.0000 mg | EXTENDED_RELEASE_TABLET | Freq: Three times a day (TID) | ORAL | 0 refills | Status: DC

## 2021-11-05 NOTE — Progress Notes (Signed)
Received a call from hospice nurse.  Patient is having more frequent breakthrough pain.  We will liberalize frequency of Percocet.  Refill both Percocet and OxyContin ?

## 2021-11-19 ENCOUNTER — Other Ambulatory Visit: Payer: Self-pay | Admitting: Hospice and Palliative Medicine

## 2021-11-19 MED ORDER — OXYCODONE-ACETAMINOPHEN 7.5-325 MG PO TABS: 1.0000 | ORAL_TABLET | ORAL | 0 refills | Status: DC | PRN

## 2021-11-19 NOTE — Progress Notes (Signed)
Hospice requested refill of Percocet.  Rx sent to pharmacy. 

## 2021-11-22 ENCOUNTER — Other Ambulatory Visit: Payer: Self-pay | Admitting: Hospice and Palliative Medicine

## 2021-11-22 MED ORDER — OXYCODONE HCL ER 15 MG PO T12A: 15.0000 mg | EXTENDED_RELEASE_TABLET | Freq: Three times a day (TID) | ORAL | 0 refills | Status: DC

## 2021-11-22 NOTE — Progress Notes (Signed)
Hospice RN requested refill of OxyContin. Rx sent.  ?

## 2021-12-05 ENCOUNTER — Other Ambulatory Visit: Payer: Self-pay | Admitting: Hospice and Palliative Medicine

## 2021-12-05 MED ORDER — OXYCODONE-ACETAMINOPHEN 7.5-325 MG PO TABS
1.0000 | ORAL_TABLET | ORAL | 0 refills | Status: DC | PRN
Start: 2021-12-05 — End: 2021-12-23

## 2021-12-05 NOTE — Progress Notes (Signed)
Percocet refilled per request of hospice RN ?

## 2021-12-06 ENCOUNTER — Inpatient Hospital Stay: Payer: BLUE CROSS/BLUE SHIELD

## 2021-12-06 ENCOUNTER — Inpatient Hospital Stay: Payer: BLUE CROSS/BLUE SHIELD | Admitting: Internal Medicine

## 2021-12-11 ENCOUNTER — Encounter: Payer: Self-pay | Admitting: Emergency Medicine

## 2021-12-11 ENCOUNTER — Emergency Department
Admission: EM | Admit: 2021-12-11 | Discharge: 2021-12-11 | Disposition: A | Discharge status: Home / Self Care | Payer: BLUE CROSS/BLUE SHIELD | Attending: Emergency Medicine | Admitting: Emergency Medicine

## 2021-12-11 ENCOUNTER — Other Ambulatory Visit: Payer: Self-pay

## 2021-12-11 ENCOUNTER — Emergency Department: Payer: BLUE CROSS/BLUE SHIELD

## 2021-12-11 DIAGNOSIS — Z87891 Personal history of nicotine dependence: Secondary | ICD-10-CM | POA: Insufficient documentation

## 2021-12-11 DIAGNOSIS — J40 Bronchitis, not specified as acute or chronic: Secondary | ICD-10-CM | POA: Diagnosis not present

## 2021-12-11 DIAGNOSIS — Z8507 Personal history of malignant neoplasm of pancreas: Secondary | ICD-10-CM | POA: Diagnosis not present

## 2021-12-11 DIAGNOSIS — Z20822 Contact with and (suspected) exposure to covid-19: Secondary | ICD-10-CM | POA: Diagnosis not present

## 2021-12-11 DIAGNOSIS — R0781 Pleurodynia: Secondary | ICD-10-CM | POA: Diagnosis present

## 2021-12-11 DIAGNOSIS — R1011 Right upper quadrant pain: Secondary | ICD-10-CM | POA: Insufficient documentation

## 2021-12-11 LAB — CBC
HCT: 35 % — ABNORMAL LOW (ref 39.0–52.0)
Hemoglobin: 10.8 g/dL — ABNORMAL LOW (ref 13.0–17.0)
MCH: 26.5 pg (ref 26.0–34.0)
MCHC: 30.9 g/dL (ref 30.0–36.0)
MCV: 86 fL (ref 80.0–100.0)
Platelets: 210 10*3/uL (ref 150–400)
RBC: 4.07 MIL/uL — ABNORMAL LOW (ref 4.22–5.81)
RDW: 14 % (ref 11.5–15.5)
WBC: 5.7 10*3/uL (ref 4.0–10.5)
nRBC: 0 % (ref 0.0–0.2)

## 2021-12-11 LAB — URINALYSIS, ROUTINE W REFLEX MICROSCOPIC
Bacteria, UA: NONE SEEN
Bilirubin Urine: NEGATIVE
Glucose, UA: >=500 mg/dL — AB
Hgb urine dipstick: NEGATIVE
Ketones, ur: NEGATIVE mg/dL
Leukocytes,Ua: NEGATIVE
Nitrite: NEGATIVE
Protein, ur: 30 mg/dL — AB
Specific Gravity, Urine: 1.037 — ABNORMAL HIGH (ref 1.005–1.030)
Squamous Epithelial / HPF: NONE SEEN (ref 0–5)
pH: 5 (ref 5.0–8.0)

## 2021-12-11 LAB — BASIC METABOLIC PANEL
Anion gap: 11 (ref 5–15)
BUN: 11 mg/dL (ref 6–20)
CO2: 23 mmol/L (ref 22–32)
Calcium: 9.2 mg/dL (ref 8.9–10.3)
Chloride: 106 mmol/L (ref 98–111)
Creatinine, Ser: 0.94 mg/dL (ref 0.61–1.24)
GFR, Estimated: >60 mL/min (ref >60)
Glucose, Bld: 137 mg/dL — ABNORMAL HIGH (ref 70–99)
Potassium: 3.5 mmol/L (ref 3.5–5.1)
Sodium: 140 mmol/L (ref 135–145)

## 2021-12-11 LAB — HEPATIC FUNCTION PANEL
ALT: 14 U/L (ref 0–44)
AST: 30 U/L (ref 15–41)
Albumin: 3.9 g/dL (ref 3.5–5.0)
Alkaline Phosphatase: 142 U/L — ABNORMAL HIGH (ref 38–126)
Bilirubin, Direct: <0.1 mg/dL (ref 0.0–0.2)
Total Bilirubin: 0.5 mg/dL (ref 0.3–1.2)
Total Protein: 7.7 g/dL (ref 6.5–8.1)

## 2021-12-11 LAB — TROPONIN I (HIGH SENSITIVITY)
Troponin I (High Sensitivity): 2 ng/L (ref <18)
Troponin I (High Sensitivity): 2 ng/L (ref <18)

## 2021-12-11 LAB — RESP PANEL BY RT-PCR (FLU A&B, COVID) ARPGX2
Influenza A by PCR: NEGATIVE
Influenza B by PCR: NEGATIVE
SARS Coronavirus 2 by RT PCR: NEGATIVE

## 2021-12-11 LAB — LIPASE, BLOOD: Lipase: 36 U/L (ref 11–51)

## 2021-12-11 MED ORDER — PREDNISONE 20 MG PO TABS: 40.0000 mg | ORAL_TABLET | Freq: Every day | ORAL | 0 refills | Status: DC

## 2021-12-11 MED ORDER — HYDROMORPHONE HCL 1 MG/ML IJ SOLN
1.0000 mg | Freq: Once | INTRAMUSCULAR | Status: AC
  Administered 2021-12-11: 1 mg via INTRAVENOUS
  Filled 2021-12-11: qty 1

## 2021-12-11 MED ORDER — AZITHROMYCIN 250 MG PO TABS: ORAL_TABLET | ORAL | 0 refills | Status: AC

## 2021-12-11 MED ORDER — IOHEXOL 350 MG/ML SOLN
100.0000 mL | Freq: Once | INTRAVENOUS | Status: AC | PRN
  Administered 2021-12-11: 100 mL via INTRAVENOUS
  Filled 2021-12-11: qty 100

## 2021-12-11 MED ORDER — ONDANSETRON HCL 4 MG/2ML IJ SOLN
4.0000 mg | Freq: Once | INTRAMUSCULAR | Status: AC
  Administered 2021-12-11: 4 mg via INTRAVENOUS
  Filled 2021-12-11: qty 2

## 2021-12-11 MED ORDER — NYSTATIN 100000 UNIT/ML MT SUSP
5.0000 mL | Freq: Four times a day (QID) | OROMUCOSAL | 0 refills | Status: DC | PRN
Start: 2021-12-11 — End: 2022-03-27

## 2021-12-11 NOTE — ED Notes (Signed)
Says for 3 days right sided lower chest pain--under his ribs.  He is tender there as well.  Also feels short of breath especially when he takes a deep breath.  He is cancer center patient for pancreatic cancer.  Not in distress currently.   ?

## 2021-12-11 NOTE — ED Provider Notes (Signed)
? ?[facility] ?Provider Note ? ? ? None  ?  (approximate) ? ? ?History  ? ?Abdominal Pain and Chest Pain ? ? ?HPI ? ?MERT DIETRICK is a 56 y.o. male with history of pancreatic cancer who comes in with concerns for shortness of breath and right-sided chest pain.  Patient reports he has had symptoms for 2 days with severe pain with taking a deep breath and pain underneath his right rib cage.  He denies ever having this previously.  He does report a history of stage IV pancreatic cancer not currently undergoing chemotherapy but is on hospice.  We discussed his goals of care and he would like to proceed with work-up to further evaluate for this. ? ?  ? ? ?Physical Exam  ? ?Triage Vital Signs: ?ED Triage Vitals  ?Enc Vitals Group  ?   BP 12/11/21 1106 124/76  ?   Pulse Rate 12/11/21 1106 89  ?   Resp 12/11/21 1106 16  ?   Temp 12/11/21 1106 98.3 ?F (36.8 ?C)  ?   Temp Source 12/11/21 1106 Oral  ?   SpO2 12/11/21 1106 95 %  ?   Weight 12/11/21 1104 192 lb 14.4 oz (87.5 kg)  ?   Height 12/11/21 1104 6' (1.829 m)  ?   Head Circumference --   ?   Peak Flow --   ?   Pain Score 12/11/21 1104 10  ?   Pain Loc --   ?   Pain Edu? --   ?   Excl. in [place]? --   ? ? ?Most recent vital signs: ?Vitals:  ? 12/11/21 1106  ?BP: 124/76  ?Pulse: 89  ?Resp: 16  ?Temp: 98.3 ?F (36.8 ?C)  ?SpO2: 95%  ? ? ? ?General: Awake, no distress.  ?CV:  Good peripheral perfusion.  ?Resp:  Normal effort.  Clear lungs ?Abd:  No distention.  Some tenderness in the right side deep breathing ?Other:   ? ? ?ED Results / Procedures / Treatments  ? ?Labs ?(all labs ordered are listed, but only abnormal results are displayed) ?Labs Reviewed  ?BASIC METABOLIC PANEL - Abnormal; Notable for the following components:  ?    Result Value  ? Glucose, Bld 137 (*)   ? All other components within normal limits  ?CBC - Abnormal; Notable for the following components:  ? RBC 4.07 (*)   ? Hemoglobin 10.8 (*)   ? HCT 35.0 (*)   ? All other components within  normal limits  ?TROPONIN I (HIGH SENSITIVITY)  ? ? ? ?EKG ? ?My interpretation of EKG: ? ?Normal sinus rate of 76 with T wave inversion in lead III, aVF, V3, no ST elevation, normal intervals ? ?RADIOLOGY ?I have reviewed the xray personally and no evidence of pneumonia port in place  ? ? ?PROCEDURES: ? ?Critical Care performed: No ? ?Procedures ? ? ?MEDICATIONS ORDERED IN ED: ?Medications - No data to display ? ? ?IMPRESSION / MDM / ASSESSMENT AND PLAN / ED COURSE  ?I reviewed the triage vital signs and the nursing notes. ? ? ?Patient with significant pancreatic cancer currently on hospice who comes in with right-sided chest pain, right upper quadrant pain.  CT imaging ordered evaluate for PE, CT abdomen to evaluate for any issues with an acute obstruction, cholecystitis.  Patient given dose of IV Dilaudid IV Zofran to help with symptoms ? ?Trope negative.  BMP negative.  CBC shows slightly lower hemoglobin at 10.8 ? ?I considered  admission for patient but given Tropes are negative x2.  On reevaluation patient feeling better.  He is now stating that this feels very similar to when he had bronchitis in the past and does report more coughing symptoms.  He does report having a smoking history.  I do not hear any obvious wheezing at this time he reports already having an inhaler at home.  We can trial some steroids, azithromycin for possible bronchitis.  This time I have lower suspicion for issue with the gallbladder given CT was negative and given patient's history patient would not be a great surgical candidate unless septic or severely ill from cholecystitis.  Patient is afebrile and otherwise very well-appearing. ? ?We discussed return precautions in regards to fevers, worsening pain or any other concerns he expressed understanding felt comfortable with discharge home with the above plan.  He was alerted that his CT scans did show worsening disease and he is going to talk to his hospice team about increasing pain  medication ?  ? ? ?FINAL CLINICAL IMPRESSION(S) / ED DIAGNOSES  ? ?Final diagnoses:  ?Bronchitis  ? ? ? ?Rx / DC Orders  ? ?ED Discharge Orders   ? ?      Ordered  ?  predniSONE (DELTASONE) 20 MG tablet  Daily with breakfast       ? 12/11/21 1451  ?  azithromycin (ZITHROMAX Z-PAK) 250 MG tablet       ? 12/11/21 1451  ? ?  ?  ? ?  ? ? ? ?Note:  This document was prepared using Dragon voice recognition software and may include unintentional dictation errors. ?  ?Vanessa Gerald, MD ?12/11/21 1452 ? ?

## 2021-12-11 NOTE — Discharge Instructions (Addendum)
Follow-up in MyChart for your COVID test and if positive discussed with your hospice team for treatment.  Start the antibiotics and the steroids to help with some inflammation and can use your inhaler as needed for shortness of breath and return to the ER if develop fevers, worsening shortness of breath or any other concerns ?

## 2021-12-11 NOTE — ED Notes (Signed)
First Nurse Note:  Pt to ED via POV stating that he is having shortness of breath and pain in the right chest arm when he coughs or takes a deep breath. Pt is a cancer pt. Pt is not on chemo. SpO2: 99% on room air, HR 81. ?

## 2021-12-11 NOTE — ED Triage Notes (Signed)
Pt comes into the ED via POV c/o right side abd pain and chest pain.  Pt states the pain is worse with inhalation.  PT currently is a stage 4 pancreatic cancer patient and is under hospice care.  Pt in NAD at this time with even and unlabored respirations.  Pt denies any blood thinner use at this time.  ?

## 2021-12-11 NOTE — Progress Notes (Signed)
[facility] ED [address]  [facility])   ?    ?This patient is a current hospice patient with [facility], admitted 1.13.23 with a terminal diagnosis of Metastatic Pancreatic Cancer. ?  ?[facility] will continue to follow for any discharge planning needs and to coordinate continuation of hospice care.  ? ?Please don't hesitate to call with any Hospice related questions or concerns.  ?  ?Thank you for the opportunity to participate in this patient's care. ?Jhonnie Garner, Therapist, sports, BSN, WTA-C ?[facility] Liaison ?[phone] ?

## 2021-12-12 ENCOUNTER — Ambulatory Visit: Payer: BLUE CROSS/BLUE SHIELD | Admitting: Internal Medicine

## 2021-12-12 ENCOUNTER — Other Ambulatory Visit: Payer: Self-pay | Admitting: *Deleted

## 2021-12-12 MED ORDER — JARDIANCE 10 MG PO TABS: 10.0000 mg | ORAL_TABLET | Freq: Every day | ORAL | 2 refills | Status: DC

## 2021-12-12 MED ORDER — OXYCODONE HCL ER 15 MG PO T12A: 15.0000 mg | EXTENDED_RELEASE_TABLET | Freq: Three times a day (TID) | ORAL | 0 refills | Status: DC

## 2021-12-13 ENCOUNTER — Other Ambulatory Visit: Payer: Self-pay

## 2021-12-13 DIAGNOSIS — C259 Malignant neoplasm of pancreas, unspecified: Secondary | ICD-10-CM

## 2021-12-16 ENCOUNTER — Inpatient Hospital Stay: Payer: BLUE CROSS/BLUE SHIELD | Attending: Internal Medicine | Admitting: Internal Medicine

## 2021-12-16 ENCOUNTER — Encounter: Payer: Self-pay | Admitting: Internal Medicine

## 2021-12-16 ENCOUNTER — Inpatient Hospital Stay: Payer: BLUE CROSS/BLUE SHIELD

## 2021-12-16 VITALS — BP 122/86 | HR 68 | Temp 97.9°F | Ht 72.0 in | Wt 184.8 lb

## 2021-12-16 DIAGNOSIS — G5603 Carpal tunnel syndrome, bilateral upper limbs: Secondary | ICD-10-CM | POA: Diagnosis not present

## 2021-12-16 DIAGNOSIS — Z833 Family history of diabetes mellitus: Secondary | ICD-10-CM | POA: Insufficient documentation

## 2021-12-16 DIAGNOSIS — E119 Type 2 diabetes mellitus without complications: Secondary | ICD-10-CM | POA: Insufficient documentation

## 2021-12-16 DIAGNOSIS — Z87442 Personal history of urinary calculi: Secondary | ICD-10-CM | POA: Insufficient documentation

## 2021-12-16 DIAGNOSIS — R5383 Other fatigue: Secondary | ICD-10-CM | POA: Insufficient documentation

## 2021-12-16 DIAGNOSIS — C259 Malignant neoplasm of pancreas, unspecified: Secondary | ICD-10-CM

## 2021-12-16 DIAGNOSIS — Z8719 Personal history of other diseases of the digestive system: Secondary | ICD-10-CM | POA: Insufficient documentation

## 2021-12-16 DIAGNOSIS — Z79899 Other long term (current) drug therapy: Secondary | ICD-10-CM | POA: Insufficient documentation

## 2021-12-16 DIAGNOSIS — E279 Disorder of adrenal gland, unspecified: Secondary | ICD-10-CM | POA: Insufficient documentation

## 2021-12-16 DIAGNOSIS — R110 Nausea: Secondary | ICD-10-CM | POA: Diagnosis not present

## 2021-12-16 DIAGNOSIS — C252 Malignant neoplasm of tail of pancreas: Secondary | ICD-10-CM | POA: Insufficient documentation

## 2021-12-16 DIAGNOSIS — Z885 Allergy status to narcotic agent status: Secondary | ICD-10-CM | POA: Insufficient documentation

## 2021-12-16 DIAGNOSIS — C787 Secondary malignant neoplasm of liver and intrahepatic bile duct: Secondary | ICD-10-CM | POA: Insufficient documentation

## 2021-12-16 DIAGNOSIS — K5730 Diverticulosis of large intestine without perforation or abscess without bleeding: Secondary | ICD-10-CM | POA: Diagnosis not present

## 2021-12-16 DIAGNOSIS — R059 Cough, unspecified: Secondary | ICD-10-CM | POA: Insufficient documentation

## 2021-12-16 DIAGNOSIS — Z87891 Personal history of nicotine dependence: Secondary | ICD-10-CM | POA: Diagnosis not present

## 2021-12-16 DIAGNOSIS — Z818 Family history of other mental and behavioral disorders: Secondary | ICD-10-CM | POA: Insufficient documentation

## 2021-12-16 DIAGNOSIS — Z886 Allergy status to analgesic agent status: Secondary | ICD-10-CM | POA: Diagnosis not present

## 2021-12-16 DIAGNOSIS — Z91018 Allergy to other foods: Secondary | ICD-10-CM | POA: Insufficient documentation

## 2021-12-16 DIAGNOSIS — Z803 Family history of malignant neoplasm of breast: Secondary | ICD-10-CM | POA: Insufficient documentation

## 2021-12-16 DIAGNOSIS — I10 Essential (primary) hypertension: Secondary | ICD-10-CM | POA: Insufficient documentation

## 2021-12-16 DIAGNOSIS — Z888 Allergy status to other drugs, medicaments and biological substances status: Secondary | ICD-10-CM | POA: Diagnosis not present

## 2021-12-16 DIAGNOSIS — Z8249 Family history of ischemic heart disease and other diseases of the circulatory system: Secondary | ICD-10-CM | POA: Diagnosis not present

## 2021-12-16 DIAGNOSIS — Z836 Family history of other diseases of the respiratory system: Secondary | ICD-10-CM | POA: Insufficient documentation

## 2021-12-16 DIAGNOSIS — R0781 Pleurodynia: Secondary | ICD-10-CM | POA: Diagnosis not present

## 2021-12-16 DIAGNOSIS — R0602 Shortness of breath: Secondary | ICD-10-CM | POA: Insufficient documentation

## 2021-12-16 LAB — CBC WITH DIFFERENTIAL/PLATELET
Abs Immature Granulocytes: 0.02 10*3/uL (ref 0.00–0.07)
Basophils Absolute: 0 10*3/uL (ref 0.0–0.1)
Basophils Relative: 0 %
Eosinophils Absolute: 0 10*3/uL (ref 0.0–0.5)
Eosinophils Relative: 0 %
HCT: 34 % — ABNORMAL LOW (ref 39.0–52.0)
Hemoglobin: 10.6 g/dL — ABNORMAL LOW (ref 13.0–17.0)
Immature Granulocytes: 0 %
Lymphocytes Relative: 10 %
Lymphs Abs: 0.7 10*3/uL (ref 0.7–4.0)
MCH: 26.7 pg (ref 26.0–34.0)
MCHC: 31.2 g/dL (ref 30.0–36.0)
MCV: 85.6 fL (ref 80.0–100.0)
Monocytes Absolute: 0.2 10*3/uL (ref 0.1–1.0)
Monocytes Relative: 4 %
Neutro Abs: 5.9 10*3/uL (ref 1.7–7.7)
Neutrophils Relative %: 86 %
Platelets: 226 10*3/uL (ref 150–400)
RBC: 3.97 MIL/uL — ABNORMAL LOW (ref 4.22–5.81)
RDW: 13.9 % (ref 11.5–15.5)
WBC: 6.8 10*3/uL (ref 4.0–10.5)
nRBC: 0 % (ref 0.0–0.2)

## 2021-12-16 LAB — COMPREHENSIVE METABOLIC PANEL
ALT: 21 U/L (ref 0–44)
AST: 25 U/L (ref 15–41)
Albumin: 3.8 g/dL (ref 3.5–5.0)
Alkaline Phosphatase: 144 U/L — ABNORMAL HIGH (ref 38–126)
Anion gap: 6 (ref 5–15)
BUN: 14 mg/dL (ref 6–20)
CO2: 25 mmol/L (ref 22–32)
Calcium: 9.2 mg/dL (ref 8.9–10.3)
Chloride: 107 mmol/L (ref 98–111)
Creatinine, Ser: 0.81 mg/dL (ref 0.61–1.24)
GFR, Estimated: >60 mL/min (ref >60)
Glucose, Bld: 112 mg/dL — ABNORMAL HIGH (ref 70–99)
Potassium: 3.8 mmol/L (ref 3.5–5.1)
Sodium: 138 mmol/L (ref 135–145)
Total Bilirubin: 0.7 mg/dL (ref 0.3–1.2)
Total Protein: 8 g/dL (ref 6.5–8.1)

## 2021-12-16 MED ORDER — HEPARIN SOD (PORK) LOCK FLUSH 100 UNIT/ML IV SOLN
500.0000 [IU] | Freq: Once | INTRAVENOUS | Status: AC
  Administered 2021-12-16: 500 [IU] via INTRAVENOUS
  Filled 2021-12-16: qty 5

## 2021-12-16 MED ORDER — AMLODIPINE BESYLATE 10 MG PO TABS
10.0000 mg | ORAL_TABLET | Freq: Every day | ORAL | 3 refills | Status: DC
Start: 2021-12-16 — End: 2022-06-08

## 2021-12-16 MED ORDER — SODIUM CHLORIDE 0.9% FLUSH
10.0000 mL | Freq: Once | INTRAVENOUS | Status: AC
  Administered 2021-12-16: 10 mL via INTRAVENOUS
  Filled 2021-12-16: qty 10

## 2021-12-16 NOTE — Progress Notes (Signed)
[facility] ?CONSULT NOTE ? ?Patient Care Team: ?Charlynne Cousins, MD as PCP - General (Internal Medicine) ?Clent Jacks, RN as Oncology Nurse Navigator ?[name], MD as Consulting Physician (Internal Medicine) ?Tester, Micah Flesher, PA-C as Equities trader) ? ?CHIEF COMPLAINTS/PURPOSE OF CONSULTATION: Pancreatic cancer ? ? ?Oncology History Overview Note  ?# April 2022- [name],  BIOPSY:  - METASTATIC PANCREATIC ACINAR CELL CARCINOMA.; STAGE IV; CT scan shows-approximately 8cm centimeter pancreatic mass; adrenal lesion; also approximately 6 cm liver lesion.   ; LIPASE NORMAL.  ? ?# April 18ht, 2022- FOLFIRINOX; udenyca x4 4 cycles-June 2022-progressive disease noted-[S/p palliative chemotherapy with FOLFIRINOX s/p 4 cyc;es-June 23rd- 2022-INCREASE in size of large pancreatic tail mass, and adjacent peripancreatic lymphadenopathy. Increased size of large complex cystic and solid mass in right hepatic lobe, consistent with hepatic metastasis. 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 Adrenal metastasis cannot be excluded]-discontinue FOLFIRINOX; declines chemotherapy-cis gem ? ?# July 21st 2022-Lynparza 300 mg twice daily [BRCA-1 mutation-genetic mutation s/p genetic counseling ?] ? ?# July 19th 2022- Lynparza 300 mg BID ; JAN 2023- CT scan-progression of disease.  Recommend discontinuation of Lynparza.  Patient declined chemotherapy. ? ?# NGS/MOLECULAR TESTS: BRCA-1*/genetic mutation ? ?#BRCA1 genetic mutation-s/p genetic counseling [July 2022] ? ? ? ?# PALLIATIVE CARE EVALUATION: ? ?# PAIN MANAGEMENT:  ? ? ?DIAGNOSIS: Pancreatic acinar carcinoma ? ?STAGE:  IV       ;  GOALS:pallaitive ? ?CURRENT/MOST RECENT THERAPY :  ? ?  ?Pancreatic cancer metastasized to liver [facility])  ?11/23/2020 Initial Diagnosis  ? Pancreatic cancer metastasized to liver [facility]) ? ?  ?11/28/2020 Cancer Staging  ? Staging form: Exocrine Pancreas, AJCC 8th  Edition ?- Clinical: Stage IV (cM1) - Signed by [name], MD on 11/28/2020 ?Histopathologic type: Acinar cell carcinoma ? ?  ?12/10/2020 -  Chemotherapy  ?  Patient is on Treatment Plan: PANCREAS MODIFIED FOLFIRINOX Q14D X 4 CYCLES ? ?  ? ?  ? ?HISTORY OF PRESENTING ILLNESS: Accompanied by his wife.  Ambulating independently. ? ?Dan [name] 56 y.o.  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 ? ?In the interim patient was seen in the emergency room for shortness of breath cough pleuritic chest pain.  Patient had a CT scan chest and pelvis-no evidence of PE.;  Noted to have progressive disease in the liver pancreas. ? ?Stated that he has been try to eat "healthy".  Lost some weight.  ? ?Patient states that his abdominal pain stable on current dose of OxyContin to 3 times daily.  Complains of intermittent nausea for which he takes nausea medications. ? ?No nausea no vomiting no diarrhea.   ? ?Review of Systems  ?Constitutional:  Positive for malaise/fatigue. Negative for chills, diaphoresis and fever.  ?HENT:  Negative for nosebleeds.   ?Eyes:  Negative for double vision.  ?Respiratory:  Negative for cough, hemoptysis, sputum production, shortness of breath and wheezing.   ?Cardiovascular:  Negative for chest pain, palpitations, orthopnea and leg swelling.  ?Gastrointestinal:  Negative for blood in stool, constipation, diarrhea, heartburn, melena and vomiting.  ?Genitourinary:  Negative for dysuria, frequency and urgency.  ?Musculoskeletal:  Negative for back pain and joint pain.  ?Skin: Negative.  Negative for itching and rash.  ?Neurological:  Negative for dizziness, tingling, focal weakness, weakness and headaches.  ?Endo/Heme/Allergies:  Does not bruise/bleed easily.  ?Psychiatric/Behavioral:  Negative for  depression. The patient is not nervous/anxious and does not have insomnia.    ? ?MEDICAL HISTORY:  ?Past Medical  History:  ?Diagnosis Date  ? Acid reflux   ? Asthma   ? Diabetes mellitus without complication ([place])   ? Family history of breast cancer   ? History of kidney stones   ? Hypertension   ? Kidney stones   ? ? ?SURGICAL HISTORY: ?Past Surgical History:  ?Procedure Laterality Date  ? COLONOSCOPY WITH PROPOFOL N/A 12/02/2019  ? Procedure: COLONOSCOPY WITH PROPOFOL;  Surgeon: [name], MD;  Location: [facility] ENDOSCOPY;  Service: Gastroenterology;  Laterality: N/A;  ? FRACTURE SURGERY Left at age 3  ? pelvic fracture  ? HEMORRHOID SURGERY N/A 01/31/2020  ? Procedure: HEMORRHOIDECTOMY;  Surgeon: Jules Husbands, MD;  Location: [facility] ORS;  Service: General;  Laterality: N/A;  ? IR IMAGING GUIDED PORT INSERTION  11/30/2020  ? pelvis fractur    ? ROTATOR CUFF REPAIR    ? ? ?SOCIAL HISTORY: ?Social History  ? ?Socioeconomic History  ? Marital status: Married  ?  Spouse name: Not on file  ? Number of children: Not on file  ? Years of education: Not on file  ? Highest education level: Not on file  ?Occupational History  ? Not on file  ?Tobacco Use  ? Smoking status: Former  ?  Packs/day: 0.50  ?  Years: 25.00  ?  Pack years: 12.50  ?  Types: Cigarettes  ? Smokeless tobacco: Never  ?Vaping Use  ? Vaping Use: Never used  ?Substance and Sexual Activity  ? Alcohol use: Not Currently  ?  Alcohol/week: 6.0 standard drinks  ?  Types: 6 Cans of beer per week  ? Drug use: No  ? Sexual activity: Yes  ?Other Topics Concern  ? Not on file  ?Social History Narrative  ? Works at [employer]. In [place]. Quit smoking 2021 June. Social. Alcohol.   ? ?Social Determinants of Health  ? ?Financial Resource Strain: Not on file  ?Food Insecurity: Not on file  ?Transportation Needs: Not on file  ?Physical Activity: Not on file  ?Stress: Not on file  ?Social Connections: Not on file  ?Intimate Partner Violence: Not on file  ? ? ?FAMILY HISTORY: ?Family History  ?Problem Relation Age of Onset  ? Alzheimer's disease Mother   ? Heart attack Father   ? Cancer Sister   ?      2 sisters 3 and 42- both died of breast cancer  ? Diabetes Brother   ? Hypertension Brother   ? Asthma Son   ? ? ?ALLERGIES:  is allergic to [facility] [aprepitant], cherry, ibuprofen, other, and tramadol. ? ?MEDICATIONS:  ?Current Outpatient Medications  ?Medication Sig Dispense Refill  ? albuterol (PROVENTIL) (2.5 MG/3ML) 0.083% nebulizer solution Take by nebulization.    ? albuterol (VENTOLIN HFA) 108 (90 Base) MCG/ACT inhaler Inhale into the lungs.    ? azithromycin (ZITHROMAX Z-PAK) 250 MG tablet Take 2 tablets (500 mg) on  Day 1,  followed by 1 tablet (250 mg) once daily on Days 2 through 5. 6 each 0  ? FLOVENT HFA 110 MCG/ACT inhaler Inhale 1 puff into the lungs 2 (two) times daily.    ? guaiFENesin-codeine 100-10 MG/5ML syrup Take 5 mLs by mouth 3 (three) times daily as needed for cough. 120 mL 0  ? JARDIANCE 10 MG TABS tablet Take 1 tablet (10 mg total) by mouth daily. 30 tablet 2  ? lidocaine-prilocaine (EMLA) cream Apply 1 application  topically as needed (prior to port a cath needle access on the days of chemotherapy). 30 g 3  ? losartan (COZAAR) 100 MG tablet Take 1 tablet (100 mg total) by mouth daily. 90 tablet 1  ? nystatin (MYCOSTATIN) 100000 UNIT/ML suspension Take 5 mLs (500,000 Units total) by mouth 4 (four) times daily as needed (swish and spit). 60 mL 0  ? omeprazole (PRILOSEC) 20 MG capsule Take 20 mg by mouth 2 (two) times daily.    ? ondansetron (ZOFRAN) 8 MG tablet Take 1 tablet (8 mg total) by mouth every 8 (eight) hours as needed for nausea, vomiting or refractory nausea / vomiting. 20 tablet 3  ? oxyCODONE (OXYCONTIN) 15 mg 12 hr tablet Take 1 tablet (15 mg total) by mouth every 8 (eight) hours. 90 tablet 0  ? oxyCODONE-acetaminophen (PERCOCET) 7.5-325 MG tablet Take 1 tablet by mouth every 4 (four) hours as needed for severe pain. 60 tablet 0  ? prochlorperazine (COMPAZINE) 10 MG tablet Take 1 tablet (10 mg total) by mouth every 6 (six) hours as needed for nausea or vomiting. 30 tablet 3   ? sildenafil (VIAGRA) 100 MG tablet Take 100 mg by mouth as directed.    ? SPIRIVA HANDIHALER 18 MCG inhalation capsule 1 capsule daily.    ? amLODipine (NORVASC) 10 MG tablet Take 1 tablet (10 mg tota

## 2021-12-16 NOTE — Progress Notes (Signed)
Needs refill of amlodipine and jardiance. ?

## 2021-12-16 NOTE — Assessment & Plan Note (Addendum)
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 STAGE IV- Currently Lynparza;-April 2023 [ER-]Mild increase in size of soft tissue masses involving the pancreatic tail and left upper quadrant; mild increase in size of right hepatic lobe mass, consistent with metastatic disease; Stable 1.9 cm left adrenal mass. ?? ?# Currently under hospice as patient declined any chemotherapy options; continue hospice for now ? ?# Nausea- anti-emetics as needed 1-2 twice a month.STABLE. ? ?# Back pain- sec to malignancy/ Percocet 7.5 TID prn; . CONTINUE OXYCONTIN 15 mg TID; STABLE.  ? ?# IV ACCESS: no malfunction- port flush/ -stable ? ?# BIL UE cramping: ?  Carpal tunnel syndrome-recommend elevation of the hands bilaterally/when resting.STABLE. ? ?# DISPOSITION:mychart appt ?#  follow up in 56month MD; port flush labs- cbc/cmp Dr.B ? ?# I reviewed the blood work- with the patient in detail; also reviewed the imaging independently [as summarized above]; and with the patient in detail.  ? ? ? ?

## 2021-12-23 ENCOUNTER — Other Ambulatory Visit: Payer: Self-pay | Admitting: *Deleted

## 2021-12-24 ENCOUNTER — Encounter: Payer: Self-pay | Admitting: Internal Medicine

## 2021-12-24 MED ORDER — OXYCODONE-ACETAMINOPHEN 7.5-325 MG PO TABS: 1.0000 | ORAL_TABLET | ORAL | 0 refills | Status: DC | PRN

## 2022-01-06 ENCOUNTER — Other Ambulatory Visit: Payer: Self-pay | Admitting: *Deleted

## 2022-01-06 MED ORDER — OXYCODONE HCL ER 15 MG PO T12A: 15.0000 mg | EXTENDED_RELEASE_TABLET | Freq: Three times a day (TID) | ORAL | 0 refills | Status: DC

## 2022-01-06 MED ORDER — OXYCODONE-ACETAMINOPHEN 7.5-325 MG PO TABS: 1.0000 | ORAL_TABLET | ORAL | 0 refills | Status: DC | PRN

## 2022-01-07 ENCOUNTER — Other Ambulatory Visit: Payer: Self-pay | Admitting: Internal Medicine

## 2022-01-07 DIAGNOSIS — C259 Malignant neoplasm of pancreas, unspecified: Secondary | ICD-10-CM

## 2022-01-08 ENCOUNTER — Encounter: Payer: Self-pay | Admitting: Internal Medicine

## 2022-01-08 NOTE — Telephone Encounter (Signed)
last filled by Dr. Jacinto Reap on 09/16/21 ? ?12/16/21 MD note:  Currently under hospice as patient declined any chemotherapy options; continue hospice for now ? ?f/u appt on 02/14/22 ? ?

## 2022-01-17 ENCOUNTER — Other Ambulatory Visit: Payer: Self-pay | Admitting: *Deleted

## 2022-01-17 MED ORDER — OXYCODONE HCL ER 15 MG PO T12A: 15.0000 mg | EXTENDED_RELEASE_TABLET | Freq: Three times a day (TID) | ORAL | 0 refills | Status: DC

## 2022-01-24 ENCOUNTER — Other Ambulatory Visit: Payer: Self-pay | Admitting: Hospice and Palliative Medicine

## 2022-01-24 MED ORDER — OXYCODONE-ACETAMINOPHEN 7.5-325 MG PO TABS: 1.0000 | ORAL_TABLET | ORAL | 0 refills | Status: DC | PRN

## 2022-01-24 NOTE — Progress Notes (Signed)
Hospice nurse requested refill of Percocet

## 2022-01-30 ENCOUNTER — Telehealth: Payer: Self-pay | Admitting: Hospice and Palliative Medicine

## 2022-01-30 NOTE — Telephone Encounter (Signed)
Hospice nurse called requesting order to switch from Jardiance to metformin '500mg'$  BID as the latter is on formulary. Okay to switch. Monitor CBGs. Recommend checking a BMP in several weeks.

## 2022-02-03 ENCOUNTER — Other Ambulatory Visit: Payer: Self-pay | Admitting: Hospice and Palliative Medicine

## 2022-02-03 MED ORDER — OXYCODONE HCL ER 15 MG PO T12A: 15.0000 mg | EXTENDED_RELEASE_TABLET | Freq: Three times a day (TID) | ORAL | 0 refills | Status: DC

## 2022-02-10 ENCOUNTER — Other Ambulatory Visit: Payer: Self-pay | Admitting: *Deleted

## 2022-02-10 MED ORDER — OXYCODONE-ACETAMINOPHEN 7.5-325 MG PO TABS: 1.0000 | ORAL_TABLET | ORAL | 0 refills | Status: DC | PRN

## 2022-02-14 ENCOUNTER — Inpatient Hospital Stay (HOSPITAL_BASED_OUTPATIENT_CLINIC_OR_DEPARTMENT_OTHER): Payer: BLUE CROSS/BLUE SHIELD | Admitting: Internal Medicine

## 2022-02-14 ENCOUNTER — Inpatient Hospital Stay: Payer: BLUE CROSS/BLUE SHIELD | Attending: Internal Medicine

## 2022-02-14 ENCOUNTER — Encounter: Payer: Self-pay | Admitting: Internal Medicine

## 2022-02-14 VITALS — BP 133/80 | HR 84 | Temp 98.3°F | Ht 72.0 in | Wt 176.2 lb

## 2022-02-14 DIAGNOSIS — C259 Malignant neoplasm of pancreas, unspecified: Secondary | ICD-10-CM

## 2022-02-14 DIAGNOSIS — R630 Anorexia: Secondary | ICD-10-CM | POA: Insufficient documentation

## 2022-02-14 DIAGNOSIS — R0789 Other chest pain: Secondary | ICD-10-CM | POA: Diagnosis not present

## 2022-02-14 DIAGNOSIS — M542 Cervicalgia: Secondary | ICD-10-CM | POA: Insufficient documentation

## 2022-02-14 DIAGNOSIS — M25511 Pain in right shoulder: Secondary | ICD-10-CM | POA: Diagnosis not present

## 2022-02-14 DIAGNOSIS — C787 Secondary malignant neoplasm of liver and intrahepatic bile duct: Secondary | ICD-10-CM | POA: Insufficient documentation

## 2022-02-14 DIAGNOSIS — M79601 Pain in right arm: Secondary | ICD-10-CM | POA: Diagnosis not present

## 2022-02-14 DIAGNOSIS — C252 Malignant neoplasm of tail of pancreas: Secondary | ICD-10-CM | POA: Insufficient documentation

## 2022-02-14 DIAGNOSIS — Z452 Encounter for adjustment and management of vascular access device: Secondary | ICD-10-CM | POA: Diagnosis not present

## 2022-02-14 LAB — COMPREHENSIVE METABOLIC PANEL
ALT: 17 U/L (ref 0–44)
AST: 28 U/L (ref 15–41)
Albumin: 3.6 g/dL (ref 3.5–5.0)
Alkaline Phosphatase: 172 U/L — ABNORMAL HIGH (ref 38–126)
Anion gap: 8 (ref 5–15)
BUN: 12 mg/dL (ref 6–20)
CO2: 24 mmol/L (ref 22–32)
Calcium: 9 mg/dL (ref 8.9–10.3)
Chloride: 104 mmol/L (ref 98–111)
Creatinine, Ser: 1.04 mg/dL (ref 0.61–1.24)
GFR, Estimated: >60 mL/min (ref >60)
Glucose, Bld: 121 mg/dL — ABNORMAL HIGH (ref 70–99)
Potassium: 3.6 mmol/L (ref 3.5–5.1)
Sodium: 136 mmol/L (ref 135–145)
Total Bilirubin: 0.5 mg/dL (ref 0.3–1.2)
Total Protein: 7.8 g/dL (ref 6.5–8.1)

## 2022-02-14 LAB — CBC WITH DIFFERENTIAL/PLATELET
Abs Immature Granulocytes: 0.01 10*3/uL (ref 0.00–0.07)
Basophils Absolute: 0 10*3/uL (ref 0.0–0.1)
Basophils Relative: 1 %
Eosinophils Absolute: 0.1 10*3/uL (ref 0.0–0.5)
Eosinophils Relative: 2 %
HCT: 32.4 % — ABNORMAL LOW (ref 39.0–52.0)
Hemoglobin: 10 g/dL — ABNORMAL LOW (ref 13.0–17.0)
Immature Granulocytes: 0 %
Lymphocytes Relative: 23 %
Lymphs Abs: 1.3 10*3/uL (ref 0.7–4.0)
MCH: 24 pg — ABNORMAL LOW (ref 26.0–34.0)
MCHC: 30.9 g/dL (ref 30.0–36.0)
MCV: 77.7 fL — ABNORMAL LOW (ref 80.0–100.0)
Monocytes Absolute: 0.5 10*3/uL (ref 0.1–1.0)
Monocytes Relative: 9 %
Neutro Abs: 3.8 10*3/uL (ref 1.7–7.7)
Neutrophils Relative %: 65 %
Platelets: 230 10*3/uL (ref 150–400)
RBC: 4.17 MIL/uL — ABNORMAL LOW (ref 4.22–5.81)
RDW: 15.2 % (ref 11.5–15.5)
WBC: 5.8 10*3/uL (ref 4.0–10.5)
nRBC: 0 % (ref 0.0–0.2)

## 2022-02-14 MED ORDER — SODIUM CHLORIDE 0.9% FLUSH
10.0000 mL | Freq: Once | INTRAVENOUS | Status: AC
  Administered 2022-02-14: 10 mL via INTRAVENOUS
  Filled 2022-02-14: qty 10

## 2022-02-14 MED ORDER — HEPARIN SOD (PORK) LOCK FLUSH 100 UNIT/ML IV SOLN
500.0000 [IU] | Freq: Once | INTRAVENOUS | Status: AC
  Administered 2022-02-14: 500 [IU] via INTRAVENOUS
  Filled 2022-02-14: qty 5

## 2022-02-14 NOTE — Progress Notes (Signed)
Pt would like to discuss getting his port removed due to pain.

## 2022-02-16 ENCOUNTER — Encounter: Payer: Self-pay | Admitting: Internal Medicine

## 2022-02-19 ENCOUNTER — Telehealth: Payer: Self-pay | Admitting: Hospice and Palliative Medicine

## 2022-02-19 ENCOUNTER — Telehealth: Payer: Self-pay | Admitting: Internal Medicine

## 2022-02-19 ENCOUNTER — Ambulatory Visit
Admission: RE | Admit: 2022-02-19 | Discharge: 2022-02-19 | Disposition: A | Discharge status: Home / Self Care | Payer: BLUE CROSS/BLUE SHIELD | Source: Ambulatory Visit | Attending: Internal Medicine | Admitting: Internal Medicine

## 2022-02-19 DIAGNOSIS — C787 Secondary malignant neoplasm of liver and intrahepatic bile duct: Secondary | ICD-10-CM | POA: Diagnosis present

## 2022-02-19 DIAGNOSIS — C259 Malignant neoplasm of pancreas, unspecified: Secondary | ICD-10-CM | POA: Diagnosis present

## 2022-02-19 DIAGNOSIS — M79601 Pain in right arm: Secondary | ICD-10-CM | POA: Insufficient documentation

## 2022-02-19 MED ORDER — OXYCODONE-ACETAMINOPHEN 7.5-325 MG PO TABS: 1.0000 | ORAL_TABLET | ORAL | 0 refills | Status: DC | PRN

## 2022-02-19 MED ORDER — OXYCODONE HCL ER 15 MG PO T12A: 15.0000 mg | EXTENDED_RELEASE_TABLET | Freq: Three times a day (TID) | ORAL | 0 refills | Status: DC

## 2022-02-19 NOTE — Telephone Encounter (Signed)
Spoke to patient regarding results of the ultrasound-right upper extremity/IJ negative for DVT recommend port explantation.  Discussed with Josh, palliative care-recommend chest x-ray further evaluation.  Agree with pain management as per [employer].   Follow-up as needed this time.  Patient will continue hospice.

## 2022-02-19 NOTE — Telephone Encounter (Signed)
I spoke with patient's hospice nurse, physician, and patient by phone. Also discussed with Dr. Rogue Bussing.   [place] negative for DVT. Patient is having worsening pain at site of port and would like it removed. Will send order to IR for port removal.   Will obtain CXR for evaluation.   Plan to increase Percocet to 1-2 tablets every 4 hours as needed. Patient would like to stay on same dose of OxyContin for now.   Patient was in agreement with this plan.

## 2022-02-20 NOTE — Addendum Note (Signed)
Addended by: Vanice Sarah on: 02/20/2022 08:48 AM   Modules accepted: Orders

## 2022-02-20 NOTE — Telephone Encounter (Signed)
Port removal schedule request faxed to specialty scheduling.

## 2022-02-20 NOTE — Telephone Encounter (Signed)
Port removal on Thurs 7/6 at 1:30p and to arrive at 12:30p.  Patient notified.

## 2022-02-24 NOTE — Progress Notes (Signed)
Spoke with patient 02/24/22 @ 11:45 am. Dan Dan over pre-procedure instructions to include the need to arrive at 12:30 for 13:30 procedure, need to be NPO after 6:30 morning of procedure, need to hold Jardiance morning of procedure and need for driver post-procedure. Patient verbalized understanding.

## 2022-02-26 ENCOUNTER — Other Ambulatory Visit: Payer: Self-pay | Admitting: Radiology

## 2022-02-27 ENCOUNTER — Ambulatory Visit
Admission: RE | Admit: 2022-02-27 | Discharge: 2022-02-27 | Disposition: A | Discharge status: Home / Self Care | Payer: BLUE CROSS/BLUE SHIELD | Source: Ambulatory Visit | Attending: Hospice and Palliative Medicine | Admitting: Hospice and Palliative Medicine

## 2022-02-27 ENCOUNTER — Other Ambulatory Visit: Payer: Self-pay

## 2022-02-27 ENCOUNTER — Encounter: Payer: Self-pay | Admitting: Radiology

## 2022-02-27 DIAGNOSIS — C259 Malignant neoplasm of pancreas, unspecified: Secondary | ICD-10-CM | POA: Diagnosis not present

## 2022-02-27 DIAGNOSIS — Z452 Encounter for adjustment and management of vascular access device: Secondary | ICD-10-CM | POA: Insufficient documentation

## 2022-02-27 HISTORY — PX: IR REMOVAL TUN ACCESS W/ PORT W/O FL MOD SED: IMG2290

## 2022-02-27 LAB — GLUCOSE, CAPILLARY: Glucose-Capillary: 96 mg/dL (ref 70–99)

## 2022-02-27 MED ORDER — MIDAZOLAM HCL 2 MG/2ML IJ SOLN
INTRAMUSCULAR | Status: AC
  Filled 2022-02-27: qty 2

## 2022-02-27 MED ORDER — FENTANYL CITRATE (PF) 100 MCG/2ML IJ SOLN
INTRAMUSCULAR | Status: AC | PRN
  Administered 2022-02-27: 50 ug via INTRAVENOUS

## 2022-02-27 MED ORDER — SODIUM CHLORIDE 0.9 % IV SOLN: INTRAVENOUS | Status: DC

## 2022-02-27 MED ORDER — FENTANYL CITRATE (PF) 100 MCG/2ML IJ SOLN
INTRAMUSCULAR | Status: AC
  Filled 2022-02-27: qty 2

## 2022-02-27 MED ORDER — LIDOCAINE-EPINEPHRINE 1 %-1:100000 IJ SOLN
INTRAMUSCULAR | Status: AC
  Administered 2022-02-27: 10 mL
  Filled 2022-02-27: qty 1

## 2022-02-27 MED ORDER — MIDAZOLAM HCL 2 MG/2ML IJ SOLN
INTRAMUSCULAR | Status: AC | PRN
  Administered 2022-02-27: 1 mg via INTRAVENOUS

## 2022-02-27 NOTE — H&P (Signed)
Chief Complaint: Patient was seen in consultation today for port removal at the request of Borders,Joshua R  Referring Physician(s): Irean Hong  Supervising Physician: Juliet Rude  Patient Status: [name] - Out-pt  History of Present Illness: Dan [name] is a 56 y.o. male with metastatic pancreatic cancer.  He is under care of hospice/palliative care.  He has been experiencing pain, presumably from the port. IR placed the port 11/2020. [place] is negative for clot.  He presents today for removal of Port, in hopes of alleviating the pain.  He is accompanied by his wife.  Past Medical History:  Diagnosis Date   Acid reflux    Asthma    Diabetes mellitus without complication ([place])    Family history of breast cancer    History of kidney stones    Hypertension    Kidney stones     Past Surgical History:  Procedure Laterality Date   COLONOSCOPY WITH PROPOFOL N/A 12/02/2019   Procedure: COLONOSCOPY WITH PROPOFOL;  Surgeon: [name], MD;  Location: [facility] ENDOSCOPY;  Service: Gastroenterology;  Laterality: N/A;   FRACTURE SURGERY Left at age 44   pelvic fracture   HEMORRHOID SURGERY N/A 01/31/2020   Procedure: HEMORRHOIDECTOMY;  Surgeon: Jules Husbands, MD;  Location: [facility] ORS;  Service: General;  Laterality: N/A;   IR IMAGING GUIDED PORT INSERTION  11/30/2020   pelvis fractur     ROTATOR CUFF REPAIR      Allergies: Emend [aprepitant], Cherry, Ibuprofen, Other, and Tramadol  Medications: Prior to Admission medications   Medication Sig Start Date End Date Taking? Authorizing Provider  oxyCODONE (OXYCONTIN) 15 mg 12 hr tablet Take 1 tablet (15 mg total) by mouth every 8 (eight) hours. 02/19/22  Yes Borders, Kirt Boys, NP  oxyCODONE-acetaminophen (PERCOCET) 7.5-325 MG tablet Take 1-2 tablets by mouth every 4 (four) hours as needed for severe pain. 02/19/22  Yes Borders, Kirt Boys, NP  albuterol (PROVENTIL) (2.5 MG/3ML) 0.083% nebulizer solution Take by nebulization. 01/31/21    [provider]  albuterol (VENTOLIN HFA) 108 (90 Base) MCG/ACT inhaler Inhale into the lungs. 12/20/20   [provider]  amitriptyline (ELAVIL) 10 MG tablet Take 10 mg by mouth at bedtime. 01/14/22   [provider]  amLODipine (NORVASC) 10 MG tablet Take 1 tablet (10 mg total) by mouth daily. 12/16/21   [name], MD  FLOVENT HFA 110 MCG/ACT inhaler Inhale 1 puff into the lungs 2 (two) times daily. 12/28/20   [provider]  JARDIANCE 10 MG TABS tablet Take 1 tablet (10 mg total) by mouth daily. 12/12/21   Borders, Kirt Boys, NP  lidocaine-prilocaine (EMLA) cream Apply 1 application topically as needed (prior to port a cath needle access on the days of chemotherapy). 11/23/20   [name], MD  losartan (COZAAR) 100 MG tablet Take 1 tablet (100 mg total) by mouth daily. 10/14/21   Vigg, Avanti, MD  nystatin (MYCOSTATIN) 100000 UNIT/ML suspension Take 5 mLs (500,000 Units total) by mouth 4 (four) times daily as needed (swish and spit). 12/11/21   Vanessa Batavia, MD  omeprazole (PRILOSEC) 20 MG capsule Take 20 mg by mouth 2 (two) times daily. 05/21/21   [provider]  ondansetron (ZOFRAN) 8 MG tablet Take 1 tablet (8 mg total) by mouth every 8 (eight) hours as needed for nausea, vomiting or refractory nausea / vomiting. 11/23/20   [name], MD  prochlorperazine (COMPAZINE) 10 MG tablet Take 1 tablet (10 mg total) by  mouth every 6 (six) hours as needed for nausea or vomiting. 04/04/21   [name], MD  sildenafil (VIAGRA) 100 MG tablet Take 100 mg by mouth as directed. 05/29/21   [provider]  SPIRIVA HANDIHALER 18 MCG inhalation capsule 1 capsule daily. 02/22/21   [provider]     Family History  Problem Relation Age of Onset   Alzheimer's disease Mother    Heart attack Father    Cancer Sister        2 sisters 54 and 53- both died of breast cancer   Diabetes Brother    Hypertension Brother     Asthma Son     Social History   Socioeconomic History   Marital status: Married    Spouse name: Not on file   Number of children: Not on file   Years of education: Not on file   Highest education level: Not on file  Occupational History   Not on file  Tobacco Use   Smoking status: Former    Packs/day: 0.50    Years: 25.00    Total pack years: 12.50    Types: Cigarettes   Smokeless tobacco: Never  Vaping Use   Vaping Use: Never used  Substance and Sexual Activity   Alcohol use: Not Currently    Alcohol/week: 6.0 standard drinks of alcohol    Types: 6 Cans of beer per week   Drug use: No   Sexual activity: Yes  Other Topics Concern   Not on file  Social History Narrative   Works at [employer]. In [place]. Quit smoking 2021 June. Social. Alcohol.    Social Determinants of Health   Financial Resource Strain: Not on file  Food Insecurity: Not on file  Transportation Needs: Not on file  Physical Activity: Not on file  Stress: Not on file  Social Connections: Not on file    Review of Systems  Constitutional: Negative.   HENT: Negative.    Eyes: Negative.   Respiratory: Negative.    Cardiovascular: Negative.   Gastrointestinal:  Positive for abdominal distention and abdominal pain.  Endocrine: Negative.   Genitourinary: Negative.   Musculoskeletal: Negative.   Skin: Negative.   Allergic/Immunologic: Negative.   Neurological: Negative.   Hematological: Negative.   Psychiatric/Behavioral: Negative.      Vital Signs: BP 120/80   Pulse 83   Temp 98.5 F (36.9 C) (Oral)   Resp 12   SpO2 98%   Physical Exam Vitals reviewed.  Constitutional:      General: He is not in acute distress.    Appearance: He is not toxic-appearing.  HENT:     Head: Normocephalic and atraumatic.     Mouth/Throat:     Mouth: Mucous membranes are moist.     Pharynx: Oropharynx is clear.  Eyes:     Extraocular Movements: Extraocular movements intact.     Conjunctiva/sclera:  Conjunctivae normal.  Cardiovascular:     Rate and Rhythm: Normal rate and regular rhythm.     Pulses: Normal pulses.  Pulmonary:     Effort: Pulmonary effort is normal. No respiratory distress.  Abdominal:     Tenderness: There is abdominal tenderness.  Skin:    General: Skin is warm and dry.     Capillary Refill: Capillary refill takes less than 2 seconds.  Neurological:     General: No focal deficit present.     Mental Status: He is alert and oriented to person, place, and time.  Psychiatric:  Mood and Affect: Mood normal.        Behavior: Behavior normal.     Imaging: US Venous Img Upper Uni Right(DVT)  Result Date: 02/19/2022 CLINICAL DATA:  56 year old male with right upper extremity swelling EXAM: RIGHT UPPER EXTREMITY VENOUS DOPPLER ULTRASOUND TECHNIQUE: Gray-scale sonography with graded compression, as well as color Doppler and duplex ultrasound were performed to evaluate the upper extremity deep venous system from the level of the subclavian vein and including the jugular, axillary, basilic, radial, ulnar and upper cephalic vein. Spectral Doppler was utilized to evaluate flow at rest and with distal augmentation maneuvers. COMPARISON:  None Available. FINDINGS: Contralateral Subclavian Vein: Respiratory phasicity is normal and symmetric with the symptomatic side. No evidence of thrombus. Normal compressibility. Internal Jugular Vein: No evidence of thrombus. Normal compressibility, respiratory phasicity and response to augmentation. Subclavian Vein: No evidence of thrombus. Normal compressibility, respiratory phasicity and response to augmentation. Axillary Vein: No evidence of thrombus. Normal compressibility, respiratory phasicity and response to augmentation. Cephalic Vein: No evidence of thrombus. Normal compressibility, respiratory phasicity and response to augmentation. Basilic Vein: No evidence of thrombus. Normal compressibility, respiratory phasicity and response to  augmentation. Brachial Veins: No evidence of thrombus. Normal compressibility, respiratory phasicity and response to augmentation. Radial Veins: No evidence of thrombus. Normal compressibility, respiratory phasicity and response to augmentation. Ulnar Veins: No evidence of thrombus. Normal compressibility, respiratory phasicity and response to augmentation. Other Findings:  None visualized. IMPRESSION: Directed duplex of the right upper extremity negative for DVT Signed, Dulcy Fanny. Nadene Rubins, RPVI [facility] Radiology Electronically Signed   By: Corrie Mckusick D.O.   On: 02/19/2022 15:29    Labs:  CBC: Recent Labs    10/02/21 1439 12/11/21 1106 12/16/21 1507 02/14/22 1456  WBC 6.3 5.7 6.8 5.8  HGB 11.7* 10.8* 10.6* 10.0*  HCT 34.6* 35.0* 34.0* 32.4*  PLT 199 210 226 230    BMP: Recent Labs    10/02/21 1439 12/11/21 1106 12/16/21 1507 02/14/22 1456  NA 141 140 138 136  K 3.9 3.5 3.8 3.6  CL 107 106 107 104  CO2 '26 23 25 24  '$ GLUCOSE 106* 137* 112* 121*  BUN '10 11 14 12  '$ CALCIUM 9.4 9.2 9.2 9.0  CREATININE 0.98 0.94 0.81 1.04  GFRNONAA >60 >60 >60 >60    LIVER FUNCTION TESTS: Recent Labs    10/02/21 1439 12/11/21 1106 12/16/21 1507 02/14/22 1456  BILITOT 0.4 0.5 0.7 0.5  AST '27 30 25 28  '$ ALT '16 14 21 17  '$ ALKPHOS 121 142* 144* 172*  PROT 7.9 7.7 8.0 7.8  ALBUMIN 4.2 3.9 3.8 3.6    Assessment and Plan:  Pancreatic cancer --under hospice care --Port causing pain along tract of catheter --for removal today with moderate sedation with planned d/c home with wife this afternoon.  Risks and benefits of image guided port-a-catheter placement was discussed with the patient including, but not limited to bleeding, infection, pneumothorax, or fibrin sheath development and need for additional procedures.  All of the patient's questions were answered, patient is agreeable to proceed. Consent signed and in chart.   Thank  you for this interesting consult.  I greatly enjoyed meeting Dan [name] and look forward to participating in their care.  A copy of this report was sent to the requesting provider on this date.  Electronically Signed: Pasty Spillers, PA 02/27/2022, 1:23 PM   I spent a total of  15 Minutes in face to face  in clinical consultation, greater than 50% of which was counseling/coordinating care for port a cath removal.

## 2022-02-27 NOTE — Procedures (Signed)
Interventional Radiology Procedure Note  Date of Procedure: 02/27/2022  Procedure: Port removal   Findings:  1. Port removal    Complications: No immediate complications noted.   Estimated Blood Loss: minimal  Follow-up and Recommendations: 1. Bedrest 1 hour    Albin Felling, MD  Vascular & Interventional Radiology  02/27/2022 2:13 PM

## 2022-03-06 ENCOUNTER — Other Ambulatory Visit: Payer: Self-pay | Admitting: Hospice and Palliative Medicine

## 2022-03-06 MED ORDER — OXYCODONE-ACETAMINOPHEN 7.5-325 MG PO TABS: 1.0000 | ORAL_TABLET | ORAL | 0 refills | Status: DC | PRN

## 2022-03-06 NOTE — Progress Notes (Signed)
Hospice requested refill of Percocet

## 2022-03-12 ENCOUNTER — Other Ambulatory Visit: Payer: Self-pay | Admitting: Hospice and Palliative Medicine

## 2022-03-12 MED ORDER — OXYCODONE HCL ER 15 MG PO T12A: 15.0000 mg | EXTENDED_RELEASE_TABLET | Freq: Three times a day (TID) | ORAL | 0 refills | Status: DC

## 2022-03-12 NOTE — Progress Notes (Signed)
Hospice requested refill of OxyContin

## 2022-03-19 ENCOUNTER — Other Ambulatory Visit: Payer: Self-pay | Admitting: Hospice and Palliative Medicine

## 2022-03-19 MED ORDER — OXYCODONE-ACETAMINOPHEN 7.5-325 MG PO TABS: 1.0000 | ORAL_TABLET | ORAL | 0 refills | Status: DC | PRN

## 2022-03-19 NOTE — Progress Notes (Signed)
Hospice requested refill of Percocet.  Rx sent to pharmacy.

## 2022-03-25 ENCOUNTER — Inpatient Hospital Stay
Admission: EM | Admit: 2022-03-25 | Discharge: 2022-03-27 | DRG: 378 | Disposition: A | Discharge status: Hospice | Payer: BLUE CROSS/BLUE SHIELD | Attending: Internal Medicine | Admitting: Internal Medicine

## 2022-03-25 ENCOUNTER — Emergency Department: Payer: BLUE CROSS/BLUE SHIELD

## 2022-03-25 ENCOUNTER — Other Ambulatory Visit: Payer: Self-pay

## 2022-03-25 DIAGNOSIS — Z7984 Long term (current) use of oral hypoglycemic drugs: Secondary | ICD-10-CM | POA: Diagnosis not present

## 2022-03-25 DIAGNOSIS — F32A Depression, unspecified: Secondary | ICD-10-CM | POA: Diagnosis present

## 2022-03-25 DIAGNOSIS — Z87891 Personal history of nicotine dependence: Secondary | ICD-10-CM

## 2022-03-25 DIAGNOSIS — J4520 Mild intermittent asthma, uncomplicated: Secondary | ICD-10-CM | POA: Diagnosis not present

## 2022-03-25 DIAGNOSIS — K921 Melena: Secondary | ICD-10-CM | POA: Diagnosis present

## 2022-03-25 DIAGNOSIS — Z888 Allergy status to other drugs, medicaments and biological substances status: Secondary | ICD-10-CM

## 2022-03-25 DIAGNOSIS — Z833 Family history of diabetes mellitus: Secondary | ICD-10-CM | POA: Diagnosis not present

## 2022-03-25 DIAGNOSIS — Z531 Procedure and treatment not carried out because of patient's decision for reasons of belief and group pressure: Secondary | ICD-10-CM | POA: Diagnosis present

## 2022-03-25 DIAGNOSIS — J45909 Unspecified asthma, uncomplicated: Secondary | ICD-10-CM | POA: Diagnosis present

## 2022-03-25 DIAGNOSIS — E119 Type 2 diabetes mellitus without complications: Secondary | ICD-10-CM | POA: Diagnosis present

## 2022-03-25 DIAGNOSIS — I10 Essential (primary) hypertension: Secondary | ICD-10-CM | POA: Diagnosis present

## 2022-03-25 DIAGNOSIS — Z825 Family history of asthma and other chronic lower respiratory diseases: Secondary | ICD-10-CM

## 2022-03-25 DIAGNOSIS — C787 Secondary malignant neoplasm of liver and intrahepatic bile duct: Secondary | ICD-10-CM | POA: Diagnosis present

## 2022-03-25 DIAGNOSIS — Z79899 Other long term (current) drug therapy: Secondary | ICD-10-CM

## 2022-03-25 DIAGNOSIS — K219 Gastro-esophageal reflux disease without esophagitis: Secondary | ICD-10-CM

## 2022-03-25 DIAGNOSIS — Z87442 Personal history of urinary calculi: Secondary | ICD-10-CM

## 2022-03-25 DIAGNOSIS — K922 Gastrointestinal hemorrhage, unspecified: Secondary | ICD-10-CM | POA: Diagnosis not present

## 2022-03-25 DIAGNOSIS — Z7951 Long term (current) use of inhaled steroids: Secondary | ICD-10-CM

## 2022-03-25 DIAGNOSIS — D62 Acute posthemorrhagic anemia: Secondary | ICD-10-CM | POA: Diagnosis present

## 2022-03-25 DIAGNOSIS — C259 Malignant neoplasm of pancreas, unspecified: Secondary | ICD-10-CM | POA: Diagnosis present

## 2022-03-25 DIAGNOSIS — Z91018 Allergy to other foods: Secondary | ICD-10-CM | POA: Diagnosis not present

## 2022-03-25 DIAGNOSIS — Z20822 Contact with and (suspected) exposure to covid-19: Secondary | ICD-10-CM | POA: Diagnosis present

## 2022-03-25 DIAGNOSIS — Z8249 Family history of ischemic heart disease and other diseases of the circulatory system: Secondary | ICD-10-CM

## 2022-03-25 HISTORY — DX: Malignant neoplasm of pancreas, unspecified: C25.9

## 2022-03-25 LAB — COMPREHENSIVE METABOLIC PANEL
ALT: 19 U/L (ref 0–44)
AST: 68 U/L — ABNORMAL HIGH (ref 15–41)
Albumin: 3.3 g/dL — ABNORMAL LOW (ref 3.5–5.0)
Alkaline Phosphatase: 172 U/L — ABNORMAL HIGH (ref 38–126)
Anion gap: 8 (ref 5–15)
BUN: 19 mg/dL (ref 6–20)
CO2: 23 mmol/L (ref 22–32)
Calcium: 9 mg/dL (ref 8.9–10.3)
Chloride: 106 mmol/L (ref 98–111)
Creatinine, Ser: 1.21 mg/dL (ref 0.61–1.24)
GFR, Estimated: >60 mL/min (ref >60)
Glucose, Bld: 89 mg/dL (ref 70–99)
Potassium: 3.9 mmol/L (ref 3.5–5.1)
Sodium: 137 mmol/L (ref 135–145)
Total Bilirubin: 1 mg/dL (ref 0.3–1.2)
Total Protein: 7.5 g/dL (ref 6.5–8.1)

## 2022-03-25 LAB — CBC
HCT: 21.4 % — ABNORMAL LOW (ref 39.0–52.0)
HCT: 18.1 % — ABNORMAL LOW (ref 39.0–52.0)
Hemoglobin: 6.4 g/dL — ABNORMAL LOW (ref 13.0–17.0)
Hemoglobin: 5.5 g/dL — ABNORMAL LOW (ref 13.0–17.0)
MCH: 22.8 pg — ABNORMAL LOW (ref 26.0–34.0)
MCH: 22.9 pg — ABNORMAL LOW (ref 26.0–34.0)
MCHC: 29.9 g/dL — ABNORMAL LOW (ref 30.0–36.0)
MCHC: 30.4 g/dL (ref 30.0–36.0)
MCV: 76.2 fL — ABNORMAL LOW (ref 80.0–100.0)
MCV: 75.4 fL — ABNORMAL LOW (ref 80.0–100.0)
Platelets: 221 10*3/uL (ref 150–400)
Platelets: 194 10*3/uL (ref 150–400)
RBC: 2.81 MIL/uL — ABNORMAL LOW (ref 4.22–5.81)
RBC: 2.4 MIL/uL — ABNORMAL LOW (ref 4.22–5.81)
RDW: 17.2 % — ABNORMAL HIGH (ref 11.5–15.5)
RDW: 17.2 % — ABNORMAL HIGH (ref 11.5–15.5)
WBC: 6.5 10*3/uL (ref 4.0–10.5)
WBC: 5.8 10*3/uL (ref 4.0–10.5)
nRBC: 0 % (ref 0.0–0.2)
nRBC: 0 % (ref 0.0–0.2)

## 2022-03-25 LAB — FOLATE: Folate: 12.1 ng/mL (ref >5.9)

## 2022-03-25 LAB — IRON AND TIBC
Iron: 12 ug/dL — ABNORMAL LOW (ref 45–182)
Saturation Ratios: 3 % — ABNORMAL LOW (ref 17.9–39.5)
TIBC: 365 ug/dL (ref 250–450)
UIBC: 353 ug/dL

## 2022-03-25 LAB — RETICULOCYTES
Immature Retic Fract: 20 % — ABNORMAL HIGH (ref 2.3–15.9)
RBC.: 2.8 MIL/uL — ABNORMAL LOW (ref 4.22–5.81)
Retic Count, Absolute: 42.6 10*3/uL (ref 19.0–186.0)
Retic Ct Pct: 1.5 % (ref 0.4–3.1)

## 2022-03-25 LAB — APTT: aPTT: 36 seconds (ref 24–36)

## 2022-03-25 LAB — GLUCOSE, CAPILLARY: Glucose-Capillary: 98 mg/dL (ref 70–99)

## 2022-03-25 LAB — SARS CORONAVIRUS 2 BY RT PCR: SARS Coronavirus 2 by RT PCR: NEGATIVE

## 2022-03-25 LAB — PROTIME-INR
INR: 1.2 (ref 0.8–1.2)
Prothrombin Time: 15 seconds (ref 11.4–15.2)

## 2022-03-25 LAB — FERRITIN: Ferritin: 238 ng/mL (ref 24–336)

## 2022-03-25 MED ORDER — SODIUM CHLORIDE 0.9 % IV SOLN
10.0000 mL/h | Freq: Once | INTRAVENOUS | Status: AC
  Administered 2022-03-25: 10 mL/h via INTRAVENOUS

## 2022-03-25 MED ORDER — MUPIROCIN 2 % EX OINT
1.0000 | TOPICAL_OINTMENT | Freq: Two times a day (BID) | CUTANEOUS | Status: DC
  Administered 2022-03-25 – 2022-03-27 (×3): 1 via NASAL
  Filled 2022-03-25: qty 22

## 2022-03-25 MED ORDER — AMITRIPTYLINE HCL 10 MG PO TABS
10.0000 mg | ORAL_TABLET | Freq: Every day | ORAL | Status: DC
Start: 2022-03-25 — End: 2022-03-27
  Administered 2022-03-25 – 2022-03-26 (×2): 10 mg via ORAL
  Filled 2022-03-25 (×3): qty 1

## 2022-03-25 MED ORDER — ALBUTEROL SULFATE (2.5 MG/3ML) 0.083% IN NEBU: 3.0000 mL | INHALATION_SOLUTION | RESPIRATORY_TRACT | Status: DC | PRN

## 2022-03-25 MED ORDER — ACETAMINOPHEN 325 MG PO TABS: 650.0000 mg | ORAL_TABLET | Freq: Four times a day (QID) | ORAL | Status: DC | PRN

## 2022-03-25 MED ORDER — OXYCODONE HCL ER 15 MG PO T12A
15.0000 mg | EXTENDED_RELEASE_TABLET | Freq: Three times a day (TID) | ORAL | Status: DC
  Administered 2022-03-25 – 2022-03-27 (×4): 15 mg via ORAL
  Filled 2022-03-25 (×7): qty 1

## 2022-03-25 MED ORDER — DM-GUAIFENESIN ER 30-600 MG PO TB12: 1.0000 | ORAL_TABLET | Freq: Two times a day (BID) | ORAL | Status: DC | PRN

## 2022-03-25 MED ORDER — SODIUM CHLORIDE 0.9 % IV SOLN: INTRAVENOUS | Status: DC

## 2022-03-25 MED ORDER — PANTOPRAZOLE SODIUM 40 MG IV SOLR: 40.0000 mg | Freq: Two times a day (BID) | INTRAVENOUS | Status: DC

## 2022-03-25 MED ORDER — SODIUM CHLORIDE 0.9 % IV SOLN
200.0000 mg | Freq: Once | INTRAVENOUS | Status: AC
  Administered 2022-03-25: 200 mg via INTRAVENOUS
  Filled 2022-03-25: qty 10

## 2022-03-25 MED ORDER — HYDRALAZINE HCL 20 MG/ML IJ SOLN
5.0000 mg | INTRAMUSCULAR | Status: DC | PRN
Start: 2022-03-25 — End: 2022-03-27

## 2022-03-25 MED ORDER — ONDANSETRON HCL 4 MG/2ML IJ SOLN
4.0000 mg | Freq: Once | INTRAMUSCULAR | Status: AC
  Administered 2022-03-25: 4 mg via INTRAVENOUS
  Filled 2022-03-25: qty 2

## 2022-03-25 MED ORDER — OXYCODONE-ACETAMINOPHEN 7.5-325 MG PO TABS
1.0000 | ORAL_TABLET | ORAL | Status: DC | PRN
  Administered 2022-03-25: 2 via ORAL
  Administered 2022-03-26 – 2022-03-27 (×5): 1 via ORAL
  Filled 2022-03-25: qty 2
  Filled 2022-03-25 (×2): qty 1
  Filled 2022-03-25: qty 2
  Filled 2022-03-25 (×2): qty 1

## 2022-03-25 MED ORDER — IOHEXOL 300 MG/ML  SOLN
100.0000 mL | Freq: Once | INTRAMUSCULAR | Status: AC | PRN
  Administered 2022-03-25: 100 mL via INTRAVENOUS

## 2022-03-25 MED ORDER — ONDANSETRON HCL 4 MG/2ML IJ SOLN: 4.0000 mg | Freq: Three times a day (TID) | INTRAMUSCULAR | Status: DC | PRN

## 2022-03-25 MED ORDER — PANTOPRAZOLE 80MG IVPB - SIMPLE MED
80.0000 mg | Freq: Once | INTRAVENOUS | Status: AC
  Administered 2022-03-25: 80 mg via INTRAVENOUS
  Filled 2022-03-25: qty 100

## 2022-03-25 MED ORDER — PANTOPRAZOLE INFUSION (NEW) - SIMPLE MED
8.0000 mg/h | INTRAVENOUS | Status: DC
  Administered 2022-03-25 – 2022-03-26 (×3): 8 mg/h via INTRAVENOUS
  Filled 2022-03-25 (×4): qty 100

## 2022-03-25 MED ORDER — SODIUM CHLORIDE 0.9 % IV BOLUS
500.0000 mL | Freq: Once | INTRAVENOUS | Status: AC
  Administered 2022-03-25: 500 mL via INTRAVENOUS

## 2022-03-25 MED ORDER — INSULIN ASPART 100 UNIT/ML IJ SOLN: 0.0000 [IU] | Freq: Every day | INTRAMUSCULAR | Status: DC

## 2022-03-25 MED ORDER — INSULIN ASPART 100 UNIT/ML IJ SOLN: 0.0000 [IU] | Freq: Three times a day (TID) | INTRAMUSCULAR | Status: DC

## 2022-03-25 NOTE — Assessment & Plan Note (Signed)
No blood transfusion due to generalized weakness getting EGD today

## 2022-03-25 NOTE — Assessment & Plan Note (Signed)
-  will not give blood transfusion

## 2022-03-25 NOTE — Assessment & Plan Note (Signed)
Recent A1c 5.5.  Well-controlled.  Patient is taking Jardiance at home -Sliding scale insulin.

## 2022-03-25 NOTE — Progress Notes (Signed)
Patient's hemoglobin is now 5.5. Here for GI bleed. Refuses blood-[employer]/Hospice. EGD scheduled for tomorrow. Messaged on-call provider Katy with update.

## 2022-03-25 NOTE — Assessment & Plan Note (Addendum)
-   On Protonix drip.

## 2022-03-25 NOTE — ED Triage Notes (Addendum)
Pt here with a loss of appetite and black tarry stools since Sunday. Pt also having back pain, took his pain medicine which has not helped. Pt also having N/V. Pt is a stage IV pancreatic cancer pt.

## 2022-03-25 NOTE — Assessment & Plan Note (Addendum)
-   Patient stopped chemotherapy, currently on hospice care -Continue home pain medications: OxyContin and as needed Percocet.

## 2022-03-25 NOTE — Assessment & Plan Note (Signed)
-   Continue home medications: Amitriptyline

## 2022-03-25 NOTE — Assessment & Plan Note (Addendum)
Acute blood loss anemia due to GI bleeding: Patient is Jehov[employer]'s Witness, refused blood transfusion.  He clearly understands the risk of refusing blood transfusion, including death. GI planning EGD today

## 2022-03-25 NOTE — ED Triage Notes (Signed)
Says black tarry stool.  Is a cancer patient.

## 2022-03-25 NOTE — Assessment & Plan Note (Signed)
Stable -Bronchodilators 

## 2022-03-25 NOTE — Assessment & Plan Note (Signed)
-   Hold blood pressure medications: Amlodipine and Cozaar due to GI bleeding and risk of developing hypotension -IV hydralazine as needed.

## 2022-03-25 NOTE — H&P (Signed)
History and Physical    Dan [name] FVC:[id number] DOB: 1965-08-30 DOA: 03/25/2022  Referring MD/NP/PA:   PCP: Charlynne Cousins, MD   Patient coming from:  The patient is coming from home.  At baseline, pt is independent for most of ADL.        Chief Complaint: Dark tarry stool  HPI: Dan [name] is a 56 y.o. male with medical history significant of metastasized stage IV pancreatic cancer on hospice care, hypertension, diabetes mellitus, asthma, GERD, depression, kidney stone, gastric ulcer, Jehovah's Witness, who presents with dark tarry stool.  Patient states that he has dark tarry stool for about 1 week.  He has intermittent central and epigastric abdominal pain, which is mild, aching, nonradiating.  Patient has nausea and vomited once without blood in vomitus.  He states that he was taking Bayer aspirin for pain about 2 weeks ago.  Patient has mild dry cough and mild shortness breath, no chest pain.  No fever or chills.  He states that he has mild dysuria and burning on urination, no frequency or hematuria.  Patient has lightheadedness.  No unilateral numbness or tinglings in extremities.  No facial droop or slurred speech.  Of note, patient is Jehovah's Witness.  Patient refused blood transfusion.  Data reviewed independently and ED Course: pt was found to have hemoglobin 6.4 (10.0 on 02/14/2022), INR 1.2, negative COVID PCR, GFR> 60.  Temperature normal, blood pressure 110/60, heart rate 94, RR 16, oxygen saturation 100% on room air.  Patient is admitted to [place] bed as inpatient.  Dr. Allen Norris of GI is consulted.   CT-abd/pelvis: 1. Interval enlargement of large complex cystic and solid pancreatic body/tail masses consistent with known pancreatic cancer. 2. Significant interval enlargement hepatic metastatic lesions. 3. Stable 18 mm left adrenal gland lesion likely metastatic disease. 4. Enlarging retroperitoneal lymph nodes likely metastatic adenopathy. 5. 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 6. Age advanced atherosclerotic calcifications involving the aorta and branch vessels.   Aortic Atherosclerosis (ICD10-I70.0).   EKG: I have personally reviewed.  Sinus rhythm, QTc 447, nonspecific T wave change.   Review of Systems:   General: no fevers, chills, no body weight gain, has poor appetite, has fatigue HEENT: no blurry vision, hearing changes or sore throat Respiratory: has dyspnea, coughing, no wheezing CV: no chest pain, no palpitations GI: has nausea, vomiting, abdominal pain, dark stool, no diarrhea, constipation GU: no dysuria, burning on urination, increased urinary frequency, hematuria  Ext: no leg edema Neuro: no unilateral weakness, numbness, or tingling, no vision change or hearing loss Skin: no rash, no skin tear. MSK: No muscle spasm, no deformity, no limitation of range of movement in spin Heme: No easy bruising.  Travel history: No recent long distant travel.   Allergy:  Allergies  Allergen Reactions   Emend [Aprepitant] Other (See Comments)    Pt c/o severe pain in abdomen / back / chest    Cherry Nausea And Vomiting   Ibuprofen Nausea And Vomiting   Other Nausea And Vomiting    olives   Tramadol Other (See Comments)    Headaches.     Past Medical History:  Diagnosis Date   Acid reflux    Asthma    Diabetes mellitus without complication ([place])    Family history of breast cancer    History of kidney stones    Hypertension    Kidney stones    Pancreas cancer ([place])  Past Surgical History:  Procedure Laterality Date   COLONOSCOPY WITH PROPOFOL N/A 12/02/2019   Procedure: COLONOSCOPY WITH PROPOFOL;  Surgeon: [name], MD;  Location: [facility] ENDOSCOPY;  Service: Gastroenterology;  Laterality: N/A;   FRACTURE SURGERY Left at age 36   pelvic fracture   HEMORRHOID SURGERY N/A 01/31/2020   Procedure: HEMORRHOIDECTOMY;  Surgeon: Jules Husbands, MD;  Location: [facility]  ORS;  Service: General;  Laterality: N/A;   IR IMAGING GUIDED PORT INSERTION  11/30/2020   IR REMOVAL TUN ACCESS W/ PORT W/O FL MOD SED  02/27/2022   pelvis fractur     ROTATOR CUFF REPAIR      Social History:  reports that he has quit smoking. His smoking use included cigarettes. He has a 12.50 pack-year smoking history. He has never used smokeless tobacco. He reports that he does not currently use alcohol after a past usage of about 6.0 standard drinks of alcohol per week. He reports that he does not use drugs.  Family History:  Family History  Problem Relation Age of Onset   Alzheimer's disease Mother    Heart attack Father    Cancer Sister        2 sisters 58 and 74- both died of breast cancer   Diabetes Brother    Hypertension Brother    Asthma Son      Prior to Admission medications   Medication Sig Start Date End Date Taking? Authorizing Provider  albuterol (PROVENTIL) (2.5 MG/3ML) 0.083% nebulizer solution Take by nebulization. 01/31/21   [provider]  albuterol (VENTOLIN HFA) 108 (90 Base) MCG/ACT inhaler Inhale into the lungs. 12/20/20   [provider]  amitriptyline (ELAVIL) 10 MG tablet Take 10 mg by mouth at bedtime. 01/14/22   [provider]  amLODipine (NORVASC) 10 MG tablet Take 1 tablet (10 mg total) by mouth daily. 12/16/21   [name], MD  FLOVENT HFA 110 MCG/ACT inhaler Inhale 1 puff into the lungs 2 (two) times daily. 12/28/20   [provider]  JARDIANCE 10 MG TABS tablet Take 1 tablet (10 mg total) by mouth daily. 12/12/21   Borders, Kirt Boys, NP  lidocaine-prilocaine (EMLA) cream Apply 1 application topically as needed (prior to port a cath needle access on the days of chemotherapy). 11/23/20   [name], MD  losartan (COZAAR) 100 MG tablet Take 1 tablet (100 mg total) by mouth daily. 10/14/21   Vigg, Avanti, MD  nystatin (MYCOSTATIN) 100000 UNIT/ML suspension Take 5 mLs (500,000 Units total) by mouth 4 (four)  times daily as needed (swish and spit). 12/11/21   Vanessa Westville, MD  omeprazole (PRILOSEC) 20 MG capsule Take 20 mg by mouth 2 (two) times daily. 05/21/21   [provider]  ondansetron (ZOFRAN) 8 MG tablet Take 1 tablet (8 mg total) by mouth every 8 (eight) hours as needed for nausea, vomiting or refractory nausea / vomiting. 11/23/20   [name], MD  oxyCODONE (OXYCONTIN) 15 mg 12 hr tablet Take 1 tablet (15 mg total) by mouth every 8 (eight) hours. 03/12/22   Borders, Kirt Boys, NP  oxyCODONE-acetaminophen (PERCOCET) 7.5-325 MG tablet Take 1-2 tablets by mouth every 4 (four) hours as needed for severe pain. 03/19/22   Borders, Kirt Boys, NP  prochlorperazine (COMPAZINE) 10 MG tablet Take 1 tablet (10 mg total) by mouth every 6 (six) hours as needed for nausea or vomiting. 04/04/21   [name], MD  sildenafil (VIAGRA) 100 MG tablet Take 100  mg by mouth as directed. 05/29/21   [provider]  SPIRIVA HANDIHALER 18 MCG inhalation capsule 1 capsule daily. 02/22/21   [provider]    Physical Exam: Vitals:   03/25/22 1209 03/25/22 1300 03/25/22 1400 03/25/22 1800  BP: 115/70 108/68 110/60 112/72  Pulse: 77 77 80 82  Resp: '14 15 16 17  '$ Temp: 98.7 F (37.1 C) 98.3 F (36.8 C) 97.7 F (36.5 C) 98 F (36.7 C)  TempSrc: Oral Oral Oral Oral  SpO2: 100% 99% 98% 99%  Weight:      Height:       General: Not in acute distress.  Pale looking. HEENT:       Eyes: PERRL, EOMI, no scleral icterus.       ENT: No discharge from the ears and nose, no pharynx injection, no tonsillar enlargement.        Neck: No JVD, no bruit, no mass felt. Heme: No neck lymph node enlargement. Cardiac: S1/S2, RRR, No murmurs, No gallops or rubs. Respiratory: No rales, wheezing, rhonchi or rubs. GI: Soft, nondistended, has central abdominal tenderness, no rebound pain, no organomegaly, BS present. GU: No hematuria Ext: No pitting leg edema bilaterally. 1+DP/PT pulse  bilaterally. Musculoskeletal: No joint deformities, No joint redness or warmth, no limitation of ROM in spin. Skin: No rashes.  Neuro: Alert, oriented X3, cranial nerves II-XII grossly intact, moves all extremities normally. Psych: Patient is not psychotic, no suicidal or hemocidal ideation.  Labs on Admission: I have personally reviewed following labs and imaging studies  CBC: Recent Labs  Lab 03/25/22 1008  WBC 6.5  HGB 6.4*  HCT 21.4*  MCV 76.2*  PLT 671   Basic Metabolic Panel: Recent Labs  Lab 03/25/22 1008  NA 137  K 3.9  CL 106  CO2 23  GLUCOSE 89  BUN 19  CREATININE 1.21  CALCIUM 9.0   GFR: Estimated Creatinine Clearance: 73.9 mL/min (by C-G formula based on SCr of 1.21 mg/dL). Liver Function Tests: Recent Labs  Lab 03/25/22 1008  AST 68*  ALT 19  ALKPHOS 172*  BILITOT 1.0  PROT 7.5  ALBUMIN 3.3*   No results for input(s): "LIPASE", "AMYLASE" in the last 168 hours. No results for input(s): "AMMONIA" in the last 168 hours. Coagulation Profile: Recent Labs  Lab 03/25/22 1307  INR 1.2   Cardiac Enzymes: No results for input(s): "CKTOTAL", "CKMB", "CKMBINDEX", "TROPONINI" in the last 168 hours. BNP (last 3 results) No results for input(s): "PROBNP" in the last 8760 hours. HbA1C: No results for input(s): "HGBA1C" in the last 72 hours. CBG: No results for input(s): "GLUCAP" in the last 168 hours. Lipid Profile: No results for input(s): "CHOL", "HDL", "LDLCALC", "TRIG", "CHOLHDL", "LDLDIRECT" in the last 72 hours. Thyroid Function Tests: No results for input(s): "TSH", "T4TOTAL", "FREET4", "T3FREE", "THYROIDAB" in the last 72 hours. Anemia Panel: Recent Labs    03/25/22 1008  FOLATE 12.1  FERRITIN 238  TIBC 365  IRON 12*  RETICCTPCT 1.5   Urine analysis:    Component Value Date/Time   COLORURINE YELLOW (A) 12/11/2021 1225   APPEARANCEUR CLEAR (A) 12/11/2021 1225   APPEARANCEUR Clear 11/14/2019 1533   LABSPEC 1.037 (H) 12/11/2021 1225    PHURINE 5.0 12/11/2021 1225   GLUCOSEU >=500 (A) 12/11/2021 1225   HGBUR NEGATIVE 12/11/2021 [place] 12/11/2021 1225   BILIRUBINUR Negative 11/14/2019 1533   KETONESUR NEGATIVE 12/11/2021 1225   PROTEINUR 30 (A) 12/11/2021 1225   UROBILINOGEN 0.2 06/07/2013 1513  NITRITE NEGATIVE 12/11/2021 1225   LEUKOCYTESUR NEGATIVE 12/11/2021 1225   Sepsis Labs: '@LABRCNTIP'$ (procalcitonin:4,lacticidven:4) ) Recent Results (from the past 240 hour(s))  SARS Coronavirus 2 by RT PCR (hospital order, performed in [facility] hospital lab) *cepheid single result test* Anterior Nasal Swab     Status: None   Collection Time: 03/25/22  1:07 PM   Specimen: Anterior Nasal Swab  Result Value Ref Range Status   SARS Coronavirus 2 by RT PCR NEGATIVE NEGATIVE Final    Comment: (NOTE) SARS-CoV-2 target nucleic acids are NOT DETECTED.  The SARS-CoV-2 RNA is generally detectable in upper and lower respiratory specimens during the acute phase of infection. The lowest concentration of SARS-CoV-2 viral copies this assay can detect is 250 copies / mL. A negative result does not preclude SARS-CoV-2 infection and should not be used as the sole basis for treatment or other patient management decisions.  A negative result may occur with improper specimen collection / handling, submission of specimen other than nasopharyngeal swab, presence of viral mutation(s) within the areas targeted by this assay, and inadequate number of viral copies (<250 copies / mL). A negative result must be combined with clinical observations, patient history, and epidemiological information.  Fact Sheet for Patients:   https://www.patel.info/  Fact Sheet for Healthcare Providers: https://hall.com/  This test is not yet approved or  cleared by the [place] FDA and has been authorized for detection and/or diagnosis of SARS-CoV-2 by FDA under an Emergency Use Authorization  (EUA).  This EUA will remain in effect (meaning this test can be used) for the duration of the COVID-19 declaration under Section 564(b)(1) of the Act, 21 U.S.C. section 360bbb-3(b)(1), unless the authorization is terminated or revoked sooner.  Performed at [facility], [address] [name], [place] [address]      Radiological Exams on Admission: CT ABDOMEN PELVIS W CONTRAST  Result Date: 03/25/2022 CLINICAL DATA:  Abdominal pain loss of appetite and black tarry stool since Sunday. Patient has stage IV pancreatic cancer. EXAM: CT ABDOMEN AND PELVIS WITH CONTRAST TECHNIQUE: Multidetector CT imaging of the abdomen and pelvis was performed using the standard protocol following bolus administration of intravenous contrast. RADIATION DOSE REDUCTION: This exam was performed according to the departmental dose-optimization program which includes automated exposure control, adjustment of the mA and/or kV according to patient size and/or use of iterative reconstruction technique. CONTRAST:  118m OMNIPAQUE IOHEXOL 300 MG/ML  SOLN COMPARISON:  Multiple previous imaging studies. The most recent CT scan is 12/11/2021 FINDINGS: Lower chest: Streaky basilar atelectasis but no worrisome pulmonary lesions or pulmonary nodules. No pleural effusions. The heart is normal in size. Hepatobiliary: Large conglomerate complex cystic and solid mass(es) occupying almost the entire right hepatic lobe. Lesion on image 21/2 measures 19 x 13 cm and previously measured 12.5 x 9.1 cm. No biliary dilatation. Pancreas: 2 large cystic and solid pancreatic body/tail masses are again demonstrated. The smaller more medial lesion measures 10.6 x 8.2 cm 8.5 x 6.5 cm. The larger more lateral mass measures 16.2 x 10.3 cm and previously measured 13.5 x 9.2 cm. Spleen: Normal size. No focal lesions. Adrenals/Urinary Tract: Stable 18 mm left adrenal gland lesion likely metastatic disease. The right adrenal gland is normal. No renal  lesions. No bladder lesions. Stomach/Bowel: The stomach is severely compressed by the pancreatic masses. The duodenum, small bowel and colon are grossly. No mass, inflammation or obstruction. Vascular/Lymphatic: Age advanced atherosclerotic calcifications involving the aorta and branch vessels but no aneurysm dissection. The major  venous structures are patent. Borderline enlarging left-sided retroperitoneal lymph node is likely metastatic disease. Reproductive: The prostate gland and seminal vesicles are unremarkable. Other: No pelvic mass or adenopathy. No free pelvic fluid collections. No inguinal mass or adenopathy. No abdominal wall hernia or subcutaneous lesions. Musculoskeletal: No worrisome lytic or sclerotic bone lesions to suggest metastatic disease. 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 IMPRESSION: 1. Interval enlargement of large complex cystic and solid pancreatic body/tail masses consistent with known pancreatic cancer. 2. Significant interval enlargement hepatic metastatic lesions. 3. Stable 18 mm left adrenal gland lesion likely metastatic disease. 4. Enlarging retroperitoneal lymph nodes likely metastatic adenopathy. 5. 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 6. Age advanced atherosclerotic calcifications involving the aorta and branch vessels. Aortic Atherosclerosis (ICD10-I70.0). Electronically Signed   By: Marijo Sanes M.D.   On: 03/25/2022 14:15      Assessment/Plan Principal Problem:   GI bleeding Active Problems:   Acute blood loss anemia   Blood transfusion declined because patient is Jehovah's Witness   Pancreatic cancer metastasized to liver ([place])   Acid reflux   Asthma   HTN (hypertension)   Type 2 diabetes mellitus without complication (HCC)   Depression   Assessment and Plan: * GI bleeding Acute blood loss anemia  due to GI bleeding: Patient is Jehovah's Witness, refused blood transfusion.  He clearly understands the risk of refusing blood transfusion, including death.  - will admitted to med-surg bed as inpatient - IVF: 500 ml of NS bolus, then at 100 mL/hr - Start IV pantoprazole gtt - Zofran IV for nausea - Avoid NSAIDs and SQ heparin - Maintain IV access (2 large bore IVs if possible). - Monitor closely and follow q6h cbc, transfuse as necessary, if Hgb<7.0 - LaB: INR, PTT and type screen - check anemia panel  - give 200 mg of IV iron of Venofer  - consulted Dr. Allen Norris of GI  Acute blood loss anemia -see above  Blood transfusion declined because patient is Jehovah's Witness -will not give blood transfusion  Pancreatic cancer metastasized to liver [facility]) - Patient stopped chemotherapy, currently on hospice care -Continue home pain medications: OxyContin and as needed Percocet  Acid reflux - On Protonix drip  Asthma Stable -Bronchodilators  HTN (hypertension) - Hold blood pressure medications: Amlodipine and Cozaar due to GI bleeding and risk of developing hypotension -IV hydralazine as needed  Type 2 diabetes mellitus without complication (HCC) Recent A1c 5.5.  Well-controlled.  Patient is taking Jardiance at home -Sliding scale insulin  Depression - Continue home medications: Amitriptyline          DVT ppx: SCD  Code Status: Full code  Family Communication:    Yes, patient's wife  at bed side.    Disposition Plan:  Anticipate discharge back to previous environment  Consults called:  Dr. Allen Norris of GI  Admission status and Level of care: Med-Surg:    as inpt       Severity of Illness:  The appropriate patient status for this patient is INPATIENT. Inpatient status is judged to be reasonable and necessary in order to provide the required intensity of service to ensure the patient's safety. The patient's presenting symptoms, physical exam findings, and initial  radiographic and laboratory data in the context of their chronic comorbidities is felt to place them at high risk for further clinical deterioration. Furthermore, it is  not anticipated that the patient will be medically stable for discharge from the hospital within 2 midnights of admission.   * I certify that at the point of admission it is my clinical judgment that the patient will require inpatient hospital care spanning beyond 2 midnights from the point of admission due to high intensity of service, high risk for further deterioration and high frequency of surveillance required.*       Date of Service 03/25/2022    Ivor [facility]   If 7PM-7AM, please contact night-coverage [email or web address] 03/25/2022, 6:08 PM

## 2022-03-25 NOTE — Progress Notes (Signed)
Manufacturing engineer [facility])       This patient is a current hospice patient with [facility], admitted with a terminal diagnosis Carcinoma of the pancreas.   [facility] will continue to follow for any discharge planning needs and to coordinate continuation of hospice care.     Please call with any questions/concerns.    Thank you for the opportunity to participate in this patient's care.  Daphene Calamity, MSW [facility] Liaison  [phone]

## 2022-03-25 NOTE — ED Notes (Signed)
Pt stated he refuses all blood products

## 2022-03-25 NOTE — ED Provider Notes (Addendum)
[facility] Provider Note    Event Date/Time   First MD Initiated Contact with Patient 03/25/22 1153     (approximate)   History   Melena   HPI  Dan [name] is a 56 y.o. male with past medical history of pancreatic cancer, on hospice, but amenable to medical treatment, here with epigastric abdominal pain and melena.  The patient states that over the last approximately week, he has had progressively worsening melena and abdominal distention.  His abdominal discomfort.  He said decreased appetite.  He does state that he was taking Bayer aspirin for his pain about 2 weeks ago.  Has a history of ulcers.  Denies any bright red blood per rectum.  Denies any blood thinner use.  He currently is on hospice due to his pancreatic cancer, is not receiving chemotherapy for this.  He states he would be amenable to medical management of his symptoms.  Denies any fevers or chills.  No other recent changes in medication.     Physical Exam   Triage Vital Signs: ED Triage Vitals  Enc Vitals Group     BP 03/25/22 1005 111/72     Pulse Rate 03/25/22 1005 94     Resp 03/25/22 1005 16     Temp 03/25/22 1005 98.7 F (37.1 C)     Temp Source 03/25/22 1005 Oral     SpO2 03/25/22 1005 100 %     Weight 03/25/22 1006 176 lb 2.4 oz (79.9 kg)     Height 03/25/22 1006 6' (1.829 m)     Head Circumference --      Peak Flow --      Pain Score 03/25/22 1005 9     Pain Loc --      Pain Edu? --      Excl. in [place]? --     Most recent vital signs: Vitals:   03/25/22 1800 03/25/22 1857  BP: 112/72 115/65  Pulse: 82 100  Resp: 17 17  Temp: 98 F (36.7 C) 100.1 F (37.8 C)  SpO2: 99% 100%     General: Awake, no distress.  CV:  Good peripheral perfusion.  Regular rate and rhythm.  No murmurs or rubs. Resp:  Normal effort.  Lungs clear to auscultation bilaterally. Abd:  No distention.  Minimal epigastric tenderness Other:  Mildly dry mucous membranes.   ED Results /  Procedures / Treatments   Labs (all labs ordered are listed, but only abnormal results are displayed) Labs Reviewed  CBC - Abnormal; Notable for the following components:      Result Value   RBC 2.81 (*)    Hemoglobin 6.4 (*)    HCT 21.4 (*)    MCV 76.2 (*)    MCH 22.8 (*)    MCHC 29.9 (*)    RDW 17.2 (*)    All other components within normal limits  COMPREHENSIVE METABOLIC PANEL - Abnormal; Notable for the following components:   Albumin 3.3 (*)    AST 68 (*)    Alkaline Phosphatase 172 (*)    All other components within normal limits  RETICULOCYTES - Abnormal; Notable for the following components:   RBC. 2.80 (*)    Immature Retic Fract 20.0 (*)    All other components within normal limits  IRON AND TIBC - Abnormal; Notable for the following components:   Iron 12 (*)    Saturation Ratios 3 (*)    All other components within normal limits  SARS CORONAVIRUS 2 BY RT PCR  PROTIME-INR  APTT  FERRITIN  FOLATE  CBC  CBC  CBC  VITAMIN B12  HIV ANTIBODY (ROUTINE TESTING W REFLEX)  BASIC METABOLIC PANEL  URINALYSIS, COMPLETE (UACMP) WITH MICROSCOPIC     EKG Normal sinus rhythm, ventricular rate 95.  PR 158, QRS 84, QTc 447.  No acute ST elevations or depression.  No EKG evidence of acute ischemia or infarct.   RADIOLOGY CT ab/pelvis: Interval enlargement of complex pancreatic mass   I also independently reviewed and agree with radiologist interpretations.   PROCEDURES:  Critical Care performed: Yes     MEDICATIONS ORDERED IN ED: Medications  pantoprozole (PROTONIX) 80 mg /NS 100 mL infusion (8 mg/hr Intravenous New Bag/Given 03/25/22 1319)  pantoprazole (PROTONIX) injection 40 mg (has no administration in time range)  0.9 %  sodium chloride infusion ( Intravenous New Bag/Given 03/25/22 1440)  ondansetron (ZOFRAN) injection 4 mg (has no administration in time range)  acetaminophen (TYLENOL) tablet 650 mg (has no administration in time range)  albuterol  (PROVENTIL) (2.5 MG/3ML) 0.083% nebulizer solution 3 mL (has no administration in time range)  dextromethorphan-guaiFENesin (MUCINEX DM) 30-600 MG per 12 hr tablet 1 tablet (has no administration in time range)  hydrALAZINE (APRESOLINE) injection 5 mg (has no administration in time range)  oxyCODONE (OXYCONTIN) 12 hr tablet 15 mg (has no administration in time range)  oxyCODONE-acetaminophen (PERCOCET) 7.5-325 MG per tablet 1-2 tablet (2 tablets Oral Given 03/25/22 1900)  amitriptyline (ELAVIL) tablet 10 mg (has no administration in time range)  insulin aspart (novoLOG) injection 0-5 Units (has no administration in time range)  insulin aspart (novoLOG) injection 0-9 Units (has no administration in time range)  0.9 %  sodium chloride infusion (0 mL/hr Intravenous Stopped 03/25/22 1328)  ondansetron (ZOFRAN) injection 4 mg (4 mg Intravenous Given 03/25/22 1226)  pantoprazole (PROTONIX) 80 mg /NS 100 mL IVPB (0 mg Intravenous Stopped 03/25/22 1319)  iron sucrose (VENOFER) 200 mg in sodium chloride 0.9 % 100 mL IVPB (0 mg Intravenous Stopped 03/25/22 1401)  iohexol (OMNIPAQUE) 300 MG/ML solution 100 mL (100 mLs Intravenous Contrast Given 03/25/22 1344)  sodium chloride 0.9 % bolus 500 mL (0 mLs Intravenous Stopped 03/25/22 1705)     IMPRESSION / MDM / ASSESSMENT AND PLAN / ED COURSE  I reviewed the triage vital signs and the nursing notes.                               The patient is on the cardiac monitor to evaluate for evidence of arrhythmia and/or significant heart rate changes.   Ddx:  Differential includes the following, with pertinent life- or limb-threatening emergencies considered:  Upper GI bleed, worsening pancreatic mass with possible gastric/duodenal involvement now, obstruction, ischemia, varices  Patient's presentation is most consistent with acute presentation with potential threat to life or bodily function.  MDM:  56 year old male with history of pancreatic cancer, here with melena  and generalized weakness.  Patient severely anemic with hemoglobin of 6.4 on arrival.  Patient is a Jehovah's Witness and declines transfusion at this time.  We discussed risk and benefits of this.  It is somewhat unclear whether he himself is 1 or his significant other, but he independently declines.  He is amenable to IV iron so will give this.  Patient is hospice, but would be amenable to treatment of possible ulcer given his degree of discomfort and weakness from the anemia.  Will start on IV PPI and I discussed with Dr. Allen Norris of GI who will see the patient.  Otherwise, CT scan shows no acute surgical abnormality.  He is hemodynamically stable.   MEDICATIONS GIVEN IN ED: Medications  pantoprozole (PROTONIX) 80 mg /NS 100 mL infusion (8 mg/hr Intravenous New Bag/Given 03/25/22 1319)  pantoprazole (PROTONIX) injection 40 mg (has no administration in time range)  0.9 %  sodium chloride infusion ( Intravenous New Bag/Given 03/25/22 1440)  ondansetron (ZOFRAN) injection 4 mg (has no administration in time range)  acetaminophen (TYLENOL) tablet 650 mg (has no administration in time range)  albuterol (PROVENTIL) (2.5 MG/3ML) 0.083% nebulizer solution 3 mL (has no administration in time range)  dextromethorphan-guaiFENesin (MUCINEX DM) 30-600 MG per 12 hr tablet 1 tablet (has no administration in time range)  hydrALAZINE (APRESOLINE) injection 5 mg (has no administration in time range)  oxyCODONE (OXYCONTIN) 12 hr tablet 15 mg (has no administration in time range)  oxyCODONE-acetaminophen (PERCOCET) 7.5-325 MG per tablet 1-2 tablet (2 tablets Oral Given 03/25/22 1900)  amitriptyline (ELAVIL) tablet 10 mg (has no administration in time range)  insulin aspart (novoLOG) injection 0-5 Units (has no administration in time range)  insulin aspart (novoLOG) injection 0-9 Units (has no administration in time range)  0.9 %  sodium chloride infusion (0 mL/hr Intravenous Stopped 03/25/22 1328)  ondansetron (ZOFRAN)  injection 4 mg (4 mg Intravenous Given 03/25/22 1226)  pantoprazole (PROTONIX) 80 mg /NS 100 mL IVPB (0 mg Intravenous Stopped 03/25/22 1319)  iron sucrose (VENOFER) 200 mg in sodium chloride 0.9 % 100 mL IVPB (0 mg Intravenous Stopped 03/25/22 1401)  iohexol (OMNIPAQUE) 300 MG/ML solution 100 mL (100 mLs Intravenous Contrast Given 03/25/22 1344)  sodium chloride 0.9 % bolus 500 mL (0 mLs Intravenous Stopped 03/25/22 1705)     Consults:  GI Dr. Allen Norris Hospitalist   EMR reviewed  Reviewed oncology notes from  [place] IMPRESSION(S) / ED DIAGNOSES   Final diagnoses:  Acute upper GI bleed  Acute blood loss anemia     Rx / DC Orders   ED Discharge Orders     None        Note:  This document was prepared using Dragon voice recognition software and may include unintentional dictation errors.   Duffy Bruce, MD 03/25/22 Leeanne Mannan    Duffy Bruce, MD 03/25/22 1945

## 2022-03-25 NOTE — Consult Note (Signed)
Lucilla Lame, MD [facility]  [address], [place], [place] [address] Phone: [phone] Fax : [phone]  Consultation  Referring Provider:     Dr. Ellender Hose Primary Care Physician:  Charlynne Cousins, MD Primary Gastroenterologist:  Dr. Vicente Males         Reason for Consultation:     Upper GI bleed  Date of Admission:  03/25/2022 Date of Consultation:  03/25/2022         HPI:   Dan [name] is a 56 y.o. 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  The patient is followed by oncology and has elected not to have any chemotherapy or radiation.  The patient has been taking aspirin and has had melena for the last 5 days.  His hemoglobin on admission was significantly 6.5 with his baseline hemoglobin 9 months ago being 12.5 with his hemoglobin over the last few months being between 10.0 and 10.8.  The patient reports that he has not had any further black stools since yesterday.  He also denies any abdominal pain and his pain is mostly in his back.  The patient denies any hematemesis but states he has been taking aspirin products for his back pain.  Past Medical History:  Diagnosis Date   Acid reflux    Asthma    Diabetes mellitus without complication ([place])    Family history of breast cancer    History of kidney stones    Hypertension    Kidney stones    Pancreas cancer [facility])     Past Surgical History:  Procedure Laterality Date   COLONOSCOPY WITH PROPOFOL N/A 12/02/2019   Procedure: COLONOSCOPY WITH PROPOFOL;  Surgeon: [name], MD;  Location: [facility] ENDOSCOPY;  Service: Gastroenterology;  Laterality: N/A;   FRACTURE SURGERY Left at age 57   pelvic fracture   HEMORRHOID SURGERY N/A 01/31/2020   Procedure: HEMORRHOIDECTOMY;  Surgeon: Jules Husbands, MD;  Location: [facility] ORS;  Service: General;  Laterality: N/A;   IR IMAGING GUIDED PORT INSERTION  11/30/2020   IR REMOVAL TUN ACCESS W/ PORT W/O FL MOD SED  02/27/2022   pelvis fractur     ROTATOR CUFF REPAIR       Prior to Admission medications   Medication Sig Start Date End Date Taking? Authorizing Provider  amitriptyline (ELAVIL) 10 MG tablet Take 10 mg by mouth at bedtime. 01/14/22  Yes [provider]  amLODipine (NORVASC) 10 MG tablet Take 1 tablet (10 mg total) by mouth daily. 12/16/21  Yes [name], MD  JARDIANCE 10 MG TABS tablet Take 1 tablet (10 mg total) by mouth daily. 12/12/21  Yes Borders, Kirt Boys, NP  losartan (COZAAR) 100 MG tablet Take 1 tablet (100 mg total) by mouth daily. 10/14/21  Yes Vigg, Avanti, MD  oxyCODONE (OXYCONTIN) 15 mg 12 hr tablet Take 1 tablet (15 mg total) by mouth every 8 (eight) hours. 03/12/22  Yes Borders, Kirt Boys, NP  oxyCODONE-acetaminophen (PERCOCET) 7.5-325 MG tablet Take 1-2 tablets by mouth every 4 (four) hours as needed for severe pain. 03/19/22  Yes Borders, Kirt Boys, NP  albuterol (PROVENTIL) (2.5 MG/3ML) 0.083% nebulizer solution Take by nebulization. 01/31/21   [provider]  albuterol (VENTOLIN HFA) 108 (90 Base) MCG/ACT inhaler Inhale into the lungs. 12/20/20   [provider]  FLOVENT HFA 110 MCG/ACT inhaler Inhale 1 puff into the lungs 2 (two) times daily. 12/28/20   [provider]  lidocaine-prilocaine (EMLA)  cream Apply 1 application topically as needed (prior to port a cath needle access on the days of chemotherapy). Patient not taking: Reported on 03/25/2022 11/23/20   [name], MD  nystatin (MYCOSTATIN) 100000 UNIT/ML suspension Take 5 mLs (500,000 Units total) by mouth 4 (four) times daily as needed (swish and spit). Patient not taking: Reported on 03/25/2022 12/11/21   Vanessa Mayview, MD  omeprazole (PRILOSEC) 20 MG capsule Take 20 mg by mouth 2 (two) times daily. 05/21/21   [provider]  ondansetron (ZOFRAN) 8 MG tablet Take 1 tablet (8 mg total) by mouth every 8 (eight) hours as needed for nausea, vomiting or refractory nausea / vomiting. Patient not taking: Reported on 03/25/2022  11/23/20   [name], MD  prochlorperazine (COMPAZINE) 10 MG tablet Take 1 tablet (10 mg total) by mouth every 6 (six) hours as needed for nausea or vomiting. Patient not taking: Reported on 03/25/2022 04/04/21   [name], MD  sildenafil (VIAGRA) 100 MG tablet Take 100 mg by mouth as directed. 05/29/21   [provider]  SPIRIVA HANDIHALER 18 MCG inhalation capsule 1 capsule daily. Patient not taking: Reported on 03/25/2022 02/22/21   [provider]    Family History  Problem Relation Age of Onset   Alzheimer's disease Mother    Heart attack Father    Cancer Sister        2 sisters 7 and 73- both died of breast cancer   Diabetes Brother    Hypertension Brother    Asthma Son      Social History   Tobacco Use   Smoking status: Former    Packs/day: 0.50    Years: 25.00    Total pack years: 12.50    Types: Cigarettes   Smokeless tobacco: Never  Vaping Use   Vaping Use: Never used  Substance Use Topics   Alcohol use: Not Currently    Alcohol/week: 6.0 standard drinks of alcohol    Types: 6 Cans of beer per week   Drug use: No    Allergies as of 03/25/2022 - Review Complete 03/25/2022  Allergen Reaction Noted   Emend [aprepitant] Other (See Comments) 12/10/2020   Cherry Nausea And Vomiting 01/11/2020   Ibuprofen Nausea And Vomiting 07/25/2015   Other Nausea And Vomiting 01/11/2020   Tramadol Other (See Comments) 07/25/2015    Review of Systems:    All systems reviewed and negative except where noted in HPI.   Physical Exam:  Vital signs in last 24 hours: Temp:  [97.7 F (36.5 C)-100.1 F (37.8 C)] 100.1 F (37.8 C) (08/01 1857) Pulse Rate:  [77-100] 100 (08/01 1857) Resp:  [14-17] 17 (08/01 1857) BP: (108-115)/(60-72) 115/65 (08/01 1857) SpO2:  [98 %-100 %] 100 % (08/01 1857) Weight:  [79.9 kg] 79.9 kg (08/01 1006)   General:   Pleasant, cooperative in NAD Head:  Normocephalic and atraumatic. Eyes:   No icterus.   Conjunctiva  pink. PERRLA. Ears:  Normal auditory acuity. Neck:  Supple; no masses or thyroidomegaly Rectal:  Not performed. Msk:  Symmetrical without gross deformities.    Extremities:  Without edema, cyanosis or clubbing. Neurologic:  Alert and oriented x3;  grossly normal neurologically. Skin:  Intact without significant lesions or rashes. Psych:  Alert and cooperative. Normal affect.  LAB RESULTS: Recent Labs    03/25/22 1008  WBC 6.5  HGB 6.4*  HCT 21.4*  PLT 221   BMET Recent Labs    03/25/22 1008  NA  137  K 3.9  CL 106  CO2 23  GLUCOSE 89  BUN 19  CREATININE 1.21  CALCIUM 9.0   LFT Recent Labs    03/25/22 1008  PROT 7.5  ALBUMIN 3.3*  AST 68*  ALT 19  ALKPHOS 172*  BILITOT 1.0   PT/INR Recent Labs    03/25/22 1307  LABPROT 15.0  INR 1.2    STUDIES: CT ABDOMEN PELVIS W CONTRAST  Result Date: 03/25/2022 CLINICAL DATA:  Abdominal pain loss of appetite and black tarry stool since Sunday. Patient has stage IV pancreatic cancer. EXAM: CT ABDOMEN AND PELVIS WITH CONTRAST TECHNIQUE: Multidetector CT imaging of the abdomen and pelvis was performed using the standard protocol following bolus administration of intravenous contrast. RADIATION DOSE REDUCTION: This exam was performed according to the departmental dose-optimization program which includes automated exposure control, adjustment of the mA and/or kV according to patient size and/or use of iterative reconstruction technique. CONTRAST:  18m OMNIPAQUE IOHEXOL 300 MG/ML  SOLN COMPARISON:  Multiple previous imaging studies. The most recent CT scan is 12/11/2021 FINDINGS: Lower chest: Streaky basilar atelectasis but no worrisome pulmonary lesions or pulmonary nodules. No pleural effusions. The heart is normal in size. Hepatobiliary: Large conglomerate complex cystic and solid mass(es) occupying almost the entire right hepatic lobe. Lesion on image 21/2 measures 19 x 13 cm and previously measured 12.5 x 9.1 cm. No biliary  dilatation. Pancreas: 2 large cystic and solid pancreatic body/tail masses are again demonstrated. The smaller more medial lesion measures 10.6 x 8.2 cm 8.5 x 6.5 cm. The larger more lateral mass measures 16.2 x 10.3 cm and previously measured 13.5 x 9.2 cm. Spleen: Normal size. No focal lesions. Adrenals/Urinary Tract: Stable 18 mm left adrenal gland lesion likely metastatic disease. The right adrenal gland is normal. No renal lesions. No bladder lesions. Stomach/Bowel: The stomach is severely compressed by the pancreatic masses. The duodenum, small bowel and colon are grossly. No mass, inflammation or obstruction. Vascular/Lymphatic: Age advanced atherosclerotic calcifications involving the aorta and branch vessels but no aneurysm dissection. The major venous structures are patent. Borderline enlarging left-sided retroperitoneal lymph node is likely metastatic disease. Reproductive: The prostate gland and seminal vesicles are unremarkable. Other: No pelvic mass or adenopathy. No free pelvic fluid collections. No inguinal mass or adenopathy. No abdominal wall hernia or subcutaneous lesions. Musculoskeletal: No worrisome lytic or sclerotic bone lesions to suggest metastatic disease. 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 IMPRESSION: 1. Interval enlargement of large complex cystic and solid pancreatic body/tail masses consistent with known pancreatic cancer. 2. Significant interval enlargement hepatic metastatic lesions. 3. Stable 18 mm left adrenal gland lesion likely metastatic disease. 4. Enlarging retroperitoneal lymph nodes likely metastatic adenopathy. 5. 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 6. Age advanced atherosclerotic calcifications involving the aorta and branch vessels. Aortic Atherosclerosis (ICD10-I70.0). Electronically Signed   By: PMarijo Sanes M.D.   On: 03/25/2022 14:15      Impression / Plan:   Assessment: Principal Problem:   GI bleeding Active Problems:   HTN (hypertension)   Type 2 diabetes mellitus without complication (HCC)   Asthma   Acid reflux   Pancreatic cancer metastasized to liver ([facility]   Depression   Blood transfusion declined because patient is Jehovah's Witness   Acute blood loss anemia   CPROCTOR CARRIKERis a 56y.o. y/o male with with  metastatic pancreatic cancer and melena for the last few days with a hemoglobin of 6.4 today.  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  The patient has been given iron infusion and is being hydrated.  The patient has not had any further black stools today and his last black stool was yesterday indicating that he is likely stopped bleeding.  Plan:  The patient will be set up for an EGD for tomorrow.  The patient will be optimized with his therapy today including iron and hydration.  The patient's iron levels have been low indicating that he likely has acute on chronic anemia.  The patient and his wife have been explained the plan and agree with it.  Thank you for involving me in the care of this patient.      LOS: 0 days   Lucilla Lame, MD, [facility] 03/25/2022, 7:18 PM,  Pager [phone] 7am-5pm  Check AMION for 5pm -7am coverage and on weekends   Note: This dictation was prepared with Dragon dictation along with smaller phrase technology. Any transcriptional errors that result from this process are unintentional.

## 2022-03-25 NOTE — ED Notes (Signed)
Pt placed of full cardiac monitors and oxygen monitor , call light in reach all questions answered

## 2022-03-26 ENCOUNTER — Encounter
Admission: EM | Disposition: A | Discharge status: Hospice | Payer: Self-pay | Source: Home / Self Care | Attending: Internal Medicine

## 2022-03-26 ENCOUNTER — Inpatient Hospital Stay: Payer: BLUE CROSS/BLUE SHIELD | Admitting: Registered Nurse

## 2022-03-26 ENCOUNTER — Encounter: Payer: Self-pay | Admitting: Internal Medicine

## 2022-03-26 DIAGNOSIS — K219 Gastro-esophageal reflux disease without esophagitis: Secondary | ICD-10-CM | POA: Diagnosis not present

## 2022-03-26 DIAGNOSIS — K922 Gastrointestinal hemorrhage, unspecified: Secondary | ICD-10-CM | POA: Diagnosis not present

## 2022-03-26 DIAGNOSIS — D62 Acute posthemorrhagic anemia: Secondary | ICD-10-CM | POA: Diagnosis not present

## 2022-03-26 HISTORY — PX: ESOPHAGOGASTRODUODENOSCOPY (EGD) WITH PROPOFOL: SHX5813

## 2022-03-26 LAB — GLUCOSE, CAPILLARY
Glucose-Capillary: 65 mg/dL — ABNORMAL LOW (ref 70–99)
Glucose-Capillary: 104 mg/dL — ABNORMAL HIGH (ref 70–99)
Glucose-Capillary: 62 mg/dL — ABNORMAL LOW (ref 70–99)
Glucose-Capillary: 107 mg/dL — ABNORMAL HIGH (ref 70–99)
Glucose-Capillary: 78 mg/dL (ref 70–99)
Glucose-Capillary: 65 mg/dL — ABNORMAL LOW (ref 70–99)
Glucose-Capillary: 108 mg/dL — ABNORMAL HIGH (ref 70–99)

## 2022-03-26 LAB — BASIC METABOLIC PANEL
Anion gap: 8 (ref 5–15)
BUN: 14 mg/dL (ref 6–20)
CO2: 23 mmol/L (ref 22–32)
Calcium: 8.5 mg/dL — ABNORMAL LOW (ref 8.9–10.3)
Chloride: 109 mmol/L (ref 98–111)
Creatinine, Ser: 1.06 mg/dL (ref 0.61–1.24)
GFR, Estimated: >60 mL/min (ref >60)
Glucose, Bld: 67 mg/dL — ABNORMAL LOW (ref 70–99)
Potassium: 3.9 mmol/L (ref 3.5–5.1)
Sodium: 140 mmol/L (ref 135–145)

## 2022-03-26 LAB — CBC
HCT: 17.8 % — ABNORMAL LOW (ref 39.0–52.0)
HCT: 17.5 % — ABNORMAL LOW (ref 39.0–52.0)
HCT: 16.8 % — ABNORMAL LOW (ref 39.0–52.0)
Hemoglobin: 5.2 g/dL — ABNORMAL LOW (ref 13.0–17.0)
Hemoglobin: 5.3 g/dL — ABNORMAL LOW (ref 13.0–17.0)
Hemoglobin: 5 g/dL — ABNORMAL LOW (ref 13.0–17.0)
MCH: 22.4 pg — ABNORMAL LOW (ref 26.0–34.0)
MCH: 22.7 pg — ABNORMAL LOW (ref 26.0–34.0)
MCH: 22.9 pg — ABNORMAL LOW (ref 26.0–34.0)
MCHC: 29.2 g/dL — ABNORMAL LOW (ref 30.0–36.0)
MCHC: 30.3 g/dL (ref 30.0–36.0)
MCHC: 29.8 g/dL — ABNORMAL LOW (ref 30.0–36.0)
MCV: 76.7 fL — ABNORMAL LOW (ref 80.0–100.0)
MCV: 75.1 fL — ABNORMAL LOW (ref 80.0–100.0)
MCV: 77.1 fL — ABNORMAL LOW (ref 80.0–100.0)
Platelets: 166 10*3/uL (ref 150–400)
Platelets: 177 10*3/uL (ref 150–400)
Platelets: 166 10*3/uL (ref 150–400)
RBC: 2.32 MIL/uL — ABNORMAL LOW (ref 4.22–5.81)
RBC: 2.33 MIL/uL — ABNORMAL LOW (ref 4.22–5.81)
RBC: 2.18 MIL/uL — ABNORMAL LOW (ref 4.22–5.81)
RDW: 17.6 % — ABNORMAL HIGH (ref 11.5–15.5)
RDW: 17.5 % — ABNORMAL HIGH (ref 11.5–15.5)
RDW: 17.5 % — ABNORMAL HIGH (ref 11.5–15.5)
WBC: 5.2 10*3/uL (ref 4.0–10.5)
WBC: 5.7 10*3/uL (ref 4.0–10.5)
WBC: 4.9 10*3/uL (ref 4.0–10.5)
nRBC: 0 % (ref 0.0–0.2)
nRBC: 0 % (ref 0.0–0.2)
nRBC: 0 % (ref 0.0–0.2)

## 2022-03-26 LAB — URINALYSIS, COMPLETE (UACMP) WITH MICROSCOPIC
Bacteria, UA: NONE SEEN
Bilirubin Urine: NEGATIVE
Glucose, UA: 150 mg/dL — AB
Ketones, ur: NEGATIVE mg/dL
Leukocytes,Ua: NEGATIVE
Nitrite: NEGATIVE
Protein, ur: 30 mg/dL — AB
Specific Gravity, Urine: 1.028 (ref 1.005–1.030)
Squamous Epithelial / HPF: NONE SEEN (ref 0–5)
pH: 5 (ref 5.0–8.0)

## 2022-03-26 LAB — VITAMIN B12: Vitamin B-12: 337 pg/mL (ref 180–914)

## 2022-03-26 LAB — HIV ANTIBODY (ROUTINE TESTING W REFLEX): HIV Screen 4th Generation wRfx: NONREACTIVE

## 2022-03-26 LAB — SURGICAL PCR SCREEN
MRSA, PCR: NEGATIVE
Staphylococcus aureus: POSITIVE — AB

## 2022-03-26 SURGERY — ESOPHAGOGASTRODUODENOSCOPY (EGD) WITH PROPOFOL
Anesthesia: General

## 2022-03-26 MED ORDER — LIDOCAINE HCL (CARDIAC) PF 100 MG/5ML IV SOSY
PREFILLED_SYRINGE | INTRAVENOUS | Status: DC | PRN
  Administered 2022-03-26: 60 mg via INTRAVENOUS

## 2022-03-26 MED ORDER — SODIUM CHLORIDE 0.9 % IV SOLN
300.0000 mg | Freq: Once | INTRAVENOUS | Status: AC
  Administered 2022-03-26: 300 mg via INTRAVENOUS
  Filled 2022-03-26: qty 300

## 2022-03-26 MED ORDER — SODIUM CHLORIDE 0.9 % IV SOLN: INTRAVENOUS | Status: DC

## 2022-03-26 MED ORDER — FENTANYL CITRATE (PF) 100 MCG/2ML IJ SOLN
INTRAMUSCULAR | Status: DC | PRN
  Administered 2022-03-26: 50 ug via INTRAVENOUS

## 2022-03-26 MED ORDER — DEXMEDETOMIDINE (PRECEDEX) IN NS 20 MCG/5ML (4 MCG/ML) IV SYRINGE
PREFILLED_SYRINGE | INTRAVENOUS | Status: DC | PRN
  Administered 2022-03-26: 8 ug via INTRAVENOUS

## 2022-03-26 MED ORDER — DEXTROSE 50 % IV SOLN
12.5000 g | INTRAVENOUS | Status: AC
  Administered 2022-03-26: 12.5 g via INTRAVENOUS
  Filled 2022-03-26: qty 50

## 2022-03-26 MED ORDER — ACETAMINOPHEN 325 MG PO TABS: 650.0000 mg | ORAL_TABLET | Freq: Four times a day (QID) | ORAL | Status: DC | PRN

## 2022-03-26 MED ORDER — FENTANYL CITRATE (PF) 100 MCG/2ML IJ SOLN
INTRAMUSCULAR | Status: AC
  Filled 2022-03-26: qty 2

## 2022-03-26 MED ORDER — MIDAZOLAM HCL 2 MG/2ML IJ SOLN
INTRAMUSCULAR | Status: AC
  Filled 2022-03-26: qty 2

## 2022-03-26 MED ORDER — MIDAZOLAM HCL 5 MG/5ML IJ SOLN
INTRAMUSCULAR | Status: DC | PRN
  Administered 2022-03-26: 1 mg via INTRAVENOUS

## 2022-03-26 MED ORDER — PROPOFOL 10 MG/ML IV BOLUS
INTRAVENOUS | Status: DC | PRN
  Administered 2022-03-26: 50 mg via INTRAVENOUS

## 2022-03-26 NOTE — Op Note (Signed)
[facility] Gastroenterology Patient Name: Dan [name] Procedure Date: 03/26/2022 2:21 PM MRN: [id number] Account #: [id number] Date of Birth: October 11, 1965 Admit Type: Inpatient Age: 56 Room: [facility] ENDO ROOM 3 Gender: Male Note Status: Finalized Instrument Name: Upper Endoscope [id number] Procedure:             Upper GI endoscopy Indications:           Melena Providers:             [name] MD, MD Referring MD:          Charlynne Cousins (Referring MD) Medicines:             Monitored Anesthesia Care Complications:         No immediate complications. Procedure:             Pre-Anesthesia Assessment:                        - Prior to the procedure, a History and Physical was                         performed, and patient medications, allergies and                         sensitivities were reviewed. The patient's tolerance                         of previous anesthesia was reviewed.                        - The risks and benefits of the procedure and the                         sedation options and risks were discussed with the                         patient. All questions were answered and informed                         consent was obtained.                        - ASA Grade Assessment: III - A patient with severe                         systemic disease.                        After obtaining informed consent, the endoscope was                         passed under direct vision. Throughout the procedure,                         the patient's blood pressure, pulse, and oxygen                         saturations were monitored continuously. The Endoscope                         was introduced through the  mouth, and advanced to the                         third part of duodenum. The upper GI endoscopy was                         accomplished with ease. The patient tolerated the                         procedure well. Findings:      The esophagus was normal.      The  stomach was normal.      The examined duodenum was normal.      The cardia and gastric fundus were normal on retroflexion. Impression:            - Normal esophagus.                        - Normal stomach.                        - Normal examined duodenum.                        - No specimens collected. Recommendation:        - Return patient to hospital ward for ongoing care.                        - Resume previous diet.                        - Continue present medications.                        - No source of bleeding seen on EGd                        suggest IV iron , consider Epopoetin shots if needed ,                         if has furthe rbleed consider CT angiogram . Procedure Code(s):     --- Professional ---                        [phone], Esophagogastroduodenoscopy, flexible,                         transoral; diagnostic, including collection of                         specimen(s) by brushing or washing, when performed                         (separate procedure) Diagnosis Code(s):     --- Professional ---                        K92.1, Melena (includes Hematochezia) CPT copyright 2019 American Medical Association. All rights reserved. The codes documented in this report are preliminary and upon coder review may  be revised to meet current compliance requirements. [name], MD [name] MD, MD 03/26/2022 2:43:36 PM This report  has been signed electronically. Number of Addenda: 0 Note Initiated On: 03/26/2022 2:21 PM Estimated Blood Loss:  Estimated blood loss: none.      [facility]

## 2022-03-26 NOTE — H&P (Signed)
Dan Bellows, MD [address], [place], [place], [place], [address] [address], [place], [place], [place], [address] Phone: [phone]  Fax: [phone]  Primary Care Physician:  Dan Cousins, MD   Pre-Procedure History & Physical: HPI:  Dan [name] is a 56 y.o. male is here for an endoscopy    Past Medical History:  Diagnosis Date   Acid reflux    Asthma    Diabetes mellitus without complication [facility])    Family history of breast cancer    History of kidney stones    Hypertension    Kidney stones    Pancreas cancer [facility])     Past Surgical History:  Procedure Laterality Date   COLONOSCOPY WITH PROPOFOL N/A 12/02/2019   Procedure: COLONOSCOPY WITH PROPOFOL;  Surgeon: Dan Bellows, MD;  Location: [facility] ENDOSCOPY;  Service: Gastroenterology;  Laterality: N/A;   FRACTURE SURGERY Left at age 16   pelvic fracture   HEMORRHOID SURGERY N/A 01/31/2020   Procedure: HEMORRHOIDECTOMY;  Surgeon: Jules Husbands, MD;  Location: [facility] ORS;  Service: General;  Laterality: N/A;   IR IMAGING GUIDED PORT INSERTION  11/30/2020   IR REMOVAL TUN ACCESS W/ PORT W/O FL MOD SED  02/27/2022   pelvis fractur     ROTATOR CUFF REPAIR      Prior to Admission medications   Medication Sig Start Date End Date Taking? Authorizing Provider  amitriptyline (ELAVIL) 10 MG tablet Take 10 mg by mouth at bedtime. 01/14/22  Yes [provider]  amLODipine (NORVASC) 10 MG tablet Take 1 tablet (10 mg total) by mouth daily. 12/16/21  Yes Dan Sickle, MD  JARDIANCE 10 MG TABS tablet Take 1 tablet (10 mg total) by mouth daily. 12/12/21  Yes Borders, Kirt Boys, NP  losartan (COZAAR) 100 MG tablet Take 1 tablet (100 mg total) by mouth daily. 10/14/21  Yes Vigg, Avanti, MD  oxyCODONE (OXYCONTIN) 15 mg 12 hr tablet Take 1 tablet (15 mg total) by mouth every 8 (eight) hours. 03/12/22  Yes Borders, Kirt Boys, NP  oxyCODONE-acetaminophen (PERCOCET) 7.5-325 MG tablet Take 1-2 tablets by mouth every 4 (four)  hours as needed for severe pain. 03/19/22  Yes Borders, Kirt Boys, NP  albuterol (PROVENTIL) (2.5 MG/3ML) 0.083% nebulizer solution Take by nebulization. 01/31/21   [provider]  albuterol (VENTOLIN HFA) 108 (90 Base) MCG/ACT inhaler Inhale into the lungs. 12/20/20   [provider]  FLOVENT HFA 110 MCG/ACT inhaler Inhale 1 puff into the lungs 2 (two) times daily. 12/28/20   [provider]  lidocaine-prilocaine (EMLA) cream Apply 1 application topically as needed (prior to port a cath needle access on the days of chemotherapy). Patient not taking: Reported on 03/25/2022 11/23/20   Dan Sickle, MD  nystatin (MYCOSTATIN) 100000 UNIT/ML suspension Take 5 mLs (500,000 Units total) by mouth 4 (four) times daily as needed (swish and spit). Patient not taking: Reported on 03/25/2022 12/11/21   Dan [place], MD  omeprazole (PRILOSEC) 20 MG capsule Take 20 mg by mouth 2 (two) times daily. 05/21/21   [provider]  ondansetron (ZOFRAN) 8 MG tablet Take 1 tablet (8 mg total) by mouth every 8 (eight) hours as needed for nausea, vomiting or refractory nausea / vomiting. Patient not taking: Reported on 03/25/2022 11/23/20   Dan Sickle, MD  prochlorperazine (COMPAZINE) 10 MG tablet Take 1 tablet (10 mg total) by mouth every 6 (six) hours as needed for nausea or vomiting. Patient not taking:  Reported on 03/25/2022 04/04/21   Dan Sickle, MD  sildenafil (VIAGRA) 100 MG tablet Take 100 mg by mouth as directed. 05/29/21   [provider]  SPIRIVA HANDIHALER 18 MCG inhalation capsule 1 capsule daily. Patient not taking: Reported on 03/25/2022 02/22/21   [provider]    Allergies as of 03/25/2022 - Review Complete 03/25/2022  Allergen Reaction Noted   Emend [aprepitant] Other (See Comments) 12/10/2020   Dan [name] Nausea And Vomiting 01/11/2020   Ibuprofen Nausea And Vomiting 07/25/2015   Other Nausea And Vomiting 01/11/2020   Tramadol Other (See  Comments) 07/25/2015    Family History  Problem Relation Age of Onset   Alzheimer's disease Mother    Heart attack Father    Cancer Sister        2 sisters 41 and 50- both died of breast cancer   Diabetes Brother    Hypertension Brother    Asthma Son     Social History   Socioeconomic History   Marital status: Married    Spouse name: Not on file   Number of children: Not on file   Years of education: Not on file   Highest education level: Not on file  Occupational History   Not on file  Tobacco Use   Smoking status: Former    Packs/day: 0.50    Years: 25.00    Total pack years: 12.50    Types: Cigarettes   Smokeless tobacco: Never  Vaping Use   Vaping Use: Never used  Substance and Sexual Activity   Alcohol use: Not Currently    Alcohol/week: 6.0 standard drinks of alcohol    Types: 6 Cans of beer per week   Drug use: No   Sexual activity: Yes  Other Topics Concern   Not on file  Social History Narrative   Works at [employer]. In [place]. Quit smoking 2021 June. Social. Alcohol.    Social Determinants of Health   Financial Resource Strain: Not on file  Food Insecurity: Not on file  Transportation Needs: Not on file  Physical Activity: Not on file  Stress: Not on file  Social Connections: Not on file  Intimate Partner Violence: Not on file    Review of Systems: See HPI, otherwise negative ROS  Physical Exam: BP 103/72   Pulse 78   Temp (!) 97.3 F (36.3 C) (Temporal)   Resp 16   Ht 6' (1.829 m)   Wt 79.4 kg   SpO2 98%   BMI 23.73 kg/m  General:   Alert,  pleasant and cooperative in NAD Head:  Normocephalic and atraumatic. Neck:  Supple; no masses or thyromegaly. Lungs:  Clear throughout to auscultation, normal respiratory effort.    Heart:  +S1, +S2, Regular rate and rhythm, No edema. Abdomen:  Soft, nontender and nondistended. Normal bowel sounds, without guarding, and without rebound.   Neurologic:  Alert and  oriented x4;  grossly normal  neurologically.  Impression/Plan: HAWTHORNE DAY is here for an endoscopy  to be performed for  evaluation of Melena. Jehovas witness , very low Hb, explained very high risk for anesthesia , unfortunately this is our only window to perform the procedure and stop any blood loss. After discussing he decided to proceed.     Risks, benefits, limitations, and alternatives regarding endoscopy have been reviewed with the patient.  Questions have been answered.  All parties agreeable.   Dan Bellows, MD  03/26/2022, 2:23 PM

## 2022-03-26 NOTE — Transfer of Care (Signed)
Immediate Anesthesia Transfer of Care Note  Patient: Dan [name]  Procedure(s) Performed: ESOPHAGOGASTRODUODENOSCOPY (EGD) WITH PROPOFOL  Patient Location: PACU  Anesthesia Type:General  Level of Consciousness: awake, alert  and oriented  Airway & Oxygen Therapy: Patient Spontanous Breathing  Post-op Assessment: Report given to RN and Post -op Vital signs reviewed and stable  Post vital signs: Reviewed and stable  Last Vitals:  Vitals Value Taken Time  BP 97/68 03/26/22 1444  Temp 36.8 C 03/26/22 1444  Pulse 73 03/26/22 1444  Resp 20 03/26/22 1444  SpO2      Last Pain:  Vitals:   03/26/22 1444  TempSrc: Temporal  PainSc: Asleep         Complications: No notable events documented.

## 2022-03-26 NOTE — TOC Initial Note (Signed)
Transition of Care [facility]) - Initial/Assessment Note    Patient Details  Name: Dan [name] MRN: [id number] Date of Birth: 05/24/1966  Transition of Care [facility]) CM/SW Contact:    Beverly Sessions, RN Phone Number: 03/26/2022, 1:34 PM  Clinical Narrative:                   Transition of Care [facility]) Screening Note   Patient Details  Name: Dan [name] Date of Birth: 1965-10-31   Transition of Care [facility]) CM/SW Contact:    Beverly Sessions, RN Phone Number: 03/26/2022, 1:34 PM    Transition of Care Department [facility]) has reviewed patient and no TOC needs have been identified at this time. We will continue to monitor patient advancement through interdisciplinary progression rounds. If new patient transition needs arise, please place a TOC consult.   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  Per chart patient independent        Patient Goals and CMS Choice        Expected Discharge Plan and Services                                                Prior Living Arrangements/Services                       Activities of Daily Living Home Assistive Devices/Equipment: None ADL Screening (condition at time of admission) Patient's cognitive ability adequate to safely complete daily activities?: Yes Is the patient deaf or have difficulty hearing?: No Does the patient have difficulty seeing, even when wearing glasses/contacts?: No Does the patient have difficulty concentrating, remembering, or making decisions?: No Patient able to express need for assistance with ADLs?: Yes Does the patient have difficulty dressing or bathing?: No Independently performs ADLs?: Yes (appropriate for developmental age) Does the patient have difficulty walking or climbing stairs?: No Weakness of Legs: None Weakness of Arms/Hands: None  Permission Sought/Granted                  Emotional Assessment              Admission diagnosis:  Acute blood loss  anemia [D62] GI bleeding [K92.2] Acute upper GI bleed [K92.2] Patient Active Problem List   Diagnosis Date Noted   GI bleeding 03/25/2022   Depression 03/25/2022   Blood transfusion declined because patient is Jehovah's Witness 03/25/2022   Acute blood loss anemia 03/25/2022   Acute upper GI bleed    Malignant neoplasm of pancreas ([place]) 10/14/2021   Genetic testing 04/04/2021   BRCA1 gene mutation positive 04/04/2021   Family history of breast cancer 03/14/2021   Pancreatic cancer metastasized to liver ([place]) 11/23/2020   Pancreatic mass 11/19/2020   Hypokalemia 11/19/2020   Flank pain 11/14/2019   Asthma    Acid reflux    Type 2 diabetes mellitus without complication ([place]) 27/78/2423   HTN (hypertension) 08/17/2013   PCP:  Charlynne Cousins, MD Pharmacy:   [facility] [address] ([address]), [place]) [place] [address] Phone: [phone] Fax: [phone]  [facility] [name], [place] [address] [place] [address] Phone: [phone] Fax: [phone]  [facility] [place], [place]  [place] [address] Phone: [phone] Fax: [phone]  [place] [address], [place] - [place] [address] Phone: [phone] Fax: [phone]  [id number] [place] [place] [address] Phone: [phone] Fax: [phone]     Social Determinants of Health (SDOH) Interventions    Readmission Risk Interventions     No data to display

## 2022-03-26 NOTE — Progress Notes (Signed)
  Progress Note   Patient: Dan [name] YEM:[id number] DOB: [birth year] DOA: 03/25/2022     1 DOS: the patient was seen and examined on 03/26/2022   Brief hospital course: 56 y.o. male with medical history significant of metastasized stage IV pancreatic cancer on hospice care, hypertension, diabetes mellitus, asthma, GERD, depression, kidney stone, gastric ulcer, Jehovah's Witness, who presents with dark tarry stool  8/1: GI consult 8/2: EGD today   Assessment and Plan: * GI bleeding Acute blood loss anemia due to GI bleeding: Patient is Jehovah's Witness, refused blood transfusion.  He clearly understands the risk of refusing blood transfusion, including death. GI planning EGD today  Acute blood loss anemia No blood transfusion due to generalized weakness getting EGD today  Blood transfusion declined because patient is Jehovah's Witness -will not give blood transfusion due to Jehovah's Witness  Pancreatic cancer metastasized to liver [facility]) - Patient stopped chemotherapy, currently on hospice care -Continue home pain medications: OxyContin and as needed Percocet.  Acid reflux - On Protonix drip.  Asthma Stable -Bronchodilators.  HTN (hypertension) - Hold blood pressure medications: Amlodipine and Cozaar due to GI bleeding and risk of developing hypotension -IV hydralazine as needed.  Type 2 diabetes mellitus without complication (HCC) Recent A1c 5.5.  Well-controlled.  Patient is taking Jardiance at home -Sliding scale insulin.  Depression - Continue Amitriptyline        Subjective: Asking what time he will be going for EGD as he wants to eat  Physical Exam: Vitals:   03/25/22 1857 03/25/22 2014 03/26/22 0340 03/26/22 0824  BP: 115/65 102/62 111/65 120/72  Pulse: 100 81 80 89  Resp: '17 14 20 16  '$ Temp: 100.1 F (37.8 C) 98.5 F (36.9 C) 99.3 F (37.4 C) 99 F (37.2 C)  TempSrc: Oral Oral Oral Oral  SpO2: 100% 97% 98% 96%  Weight:      Height:        56 year old male lying in the bed comfortably without any acute distress Lungs clear to auscultation bilaterally Cardiovascular regular rate and rhythm Abdomen soft, benign Neuro alert and oriented, nonfocal Skin pale  Neuro normal mood and affect Data Reviewed:  Hemoglobin 5.3  Family Communication: Wife updated at bedside  Disposition: Status is: Inpatient Remains inpatient appropriate because: Getting EGD today    Planned Discharge Destination: Home with hospice    DVT prophylaxis-SCDs Time spent: 35 minutes  Author: Max Sane, MD 03/26/2022 1:48 PM  For on call review [email or web address].

## 2022-03-26 NOTE — Progress Notes (Signed)
[facility]) Hospital liaison Note:  Dan [name] is a current patient of [facility], admitted with a terminal diagnosis of Carcinoma of the pancreas. Patient was brought to the hospital for reports of having black, tarry stool. Patient was admitted to [facility] on 8.1.23 with a diagnosis of GI bleed. Per [facility] Dr. Gilford Rile, this is a related hospital admission.   Visited patient at the bedside. Patient is alert and oriented and able to make needs known.  Patient reports some back pain, that is currently being managed by hospital interventions. Patient asking appropriate questions about EGD scheduled for this afternoon.   Patient is inpatient appropriate due to IV Iron and EGD procedure.   Vital signs: Temp: 99.0,  BP: 120/72, P: 89, RR: 16, 02: 96% on Room air    I&0: 730.10/1448  Abnormal labs:  RBC: 2.33 (L) Hemoglobin: 5.3 (L) HCT: 17.5 (L) MCV: 75.1 (L) MCH: 22.7 (L) RDW: 17.5 (H) Glucose-Capillary: 62 (L) Iron: 12 (L)  Diagnostics: None   IV/PRN Meds:  dextrose 50 % solution 12.5 g - IV  0.9 % sodium chloride infusion - 122m/hr, continuous, IV   iron sucrose (VENOFER) 200 mg in sodium chloride 0.9 % 100 mL IVPB - '200mg'$  IV   pantoprazole (PROTONIX) 80 mg /NS 100 mL IVPB - '80mg'$ , IV, Once  pantoprozole (PROTONIX) 80 mg /NS 100 mL infusion - Rate: 140mhr Dose: '8mg'$ /hr, Continuous, IV  Assessment: Principal Problem:   GI bleeding Active Problems:   HTN (hypertension)   Type 2 diabetes mellitus without complication (HCC)   Asthma   Acid reflux   Pancreatic cancer metastasized to liver (HCC)   Depression   Blood transfusion declined because patient is Jehovah's Witness   Acute blood loss anemia     Dan [name] a [age].o. y/o male with with metastatic pancreatic cancer and melena for the last few days with a hemoglobin of 6.4 today.  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  The patient has been given iron infusion and is being hydrated.   The patient has not had any further black stools today and his last black stool was yesterday indicating that he is likely stopped bleeding.   Plan:   The patient will be set up for an EGD for tomorrow.  The patient will be optimized with his therapy today including iron and hydration.  The patient's iron levels have been low indicating that he likely has acute on chronic anemia.  The patient and his wife have been explained the plan and agree with it.  Family contact: None present, patient was able to speak for himself.   Discharge planing: Ongoing, plan to discharge back to home with hospice.   IDT: Updated  Goals of Care: Ongoing, patient currently Full Code.   [facility]ison  [phone]

## 2022-03-26 NOTE — Hospital Course (Signed)
56 y.o. male with medical history significant of metastasized stage IV pancreatic cancer on hospice care, hypertension, diabetes mellitus, asthma, GERD, depression, kidney stone, gastric ulcer, Jehovah's Witness, who presents with dark tarry stool  8/1: GI consult 8/2: EGD today

## 2022-03-26 NOTE — Anesthesia Preprocedure Evaluation (Addendum)
Anesthesia Evaluation  Patient identified by MRN, date of birth, ID band Patient awake    Reviewed: Allergy & Precautions, NPO status , Patient's Chart, lab work & pertinent test results  Airway Mallampati: II  TM Distance: >3 FB Neck ROM: full    Dental  (+) Teeth Intact   Pulmonary neg pulmonary ROS, asthma , former smoker,    Pulmonary exam normal breath sounds clear to auscultation       Cardiovascular Exercise Tolerance: Good hypertension, Pt. on medications negative cardio ROS Normal cardiovascular exam Rhythm:Regular Rate:Normal     Neuro/Psych Depression negative neurological ROS  negative psych ROS   GI/Hepatic negative GI ROS, Neg liver ROS, GERD  Medicated,  Endo/Other  negative endocrine ROSdiabetes, Type 2, Oral Hypoglycemic Agents  Renal/GU negative Renal ROS  negative genitourinary   Musculoskeletal   Abdominal Normal abdominal exam  (+)   Peds negative pediatric ROS (+)  Hematology negative hematology ROS (+) Blood dyscrasia, anemia ,   Anesthesia Other Findings Past Medical History: No date: Acid reflux No date: Asthma No date: Diabetes mellitus without complication (HCC) No date: Family history of breast cancer No date: History of kidney stones No date: Hypertension No date: Kidney stones No date: Pancreas cancer [facility])  Past Surgical History: 12/02/2019: COLONOSCOPY WITH PROPOFOL; N/A     Comment:  Procedure: COLONOSCOPY WITH PROPOFOL;  Surgeon: [name], MD;  Location: [facility] ENDOSCOPY;  Service:               Gastroenterology;  Laterality: N/A; at age 25: FRACTURE SURGERY; Left     Comment:  pelvic fracture 01/31/2020: HEMORRHOID SURGERY; N/A     Comment:  Procedure: HEMORRHOIDECTOMY;  Surgeon: Jules Husbands,               MD;  Location: [facility] ORS;  Service: General;  Laterality:               N/A; 11/30/2020: IR IMAGING GUIDED PORT INSERTION 02/27/2022: IR REMOVAL TUN ACCESS  W/ PORT W/O FL MOD SED No date: pelvis fractur No date: ROTATOR CUFF REPAIR  BMI    Body Mass Index: 23.73 kg/m      Reproductive/Obstetrics negative OB ROS                            Anesthesia Physical Anesthesia Plan  ASA: 3  Anesthesia Plan: General   Post-op Pain Management:    Induction:   PONV Risk Score and Plan: Propofol infusion and TIVA  Airway Management Planned: Natural Airway  Additional Equipment:   Intra-op Plan:   Post-operative Plan:   Informed Consent: I have reviewed the patients History and Physical, chart, labs and discussed the procedure including the risks, benefits and alternatives for the proposed anesthesia with the patient or authorized representative who has indicated his/her understanding and acceptance.     Dental Advisory Given  Plan Discussed with: CRNA and Surgeon  Anesthesia Plan Comments:         Anesthesia Quick Evaluation

## 2022-03-27 ENCOUNTER — Encounter: Payer: Self-pay | Admitting: Gastroenterology

## 2022-03-27 ENCOUNTER — Other Ambulatory Visit: Payer: Self-pay

## 2022-03-27 DIAGNOSIS — J4520 Mild intermittent asthma, uncomplicated: Secondary | ICD-10-CM | POA: Diagnosis not present

## 2022-03-27 DIAGNOSIS — K922 Gastrointestinal hemorrhage, unspecified: Secondary | ICD-10-CM | POA: Diagnosis not present

## 2022-03-27 DIAGNOSIS — K219 Gastro-esophageal reflux disease without esophagitis: Secondary | ICD-10-CM | POA: Diagnosis not present

## 2022-03-27 DIAGNOSIS — D62 Acute posthemorrhagic anemia: Secondary | ICD-10-CM | POA: Diagnosis not present

## 2022-03-27 LAB — BASIC METABOLIC PANEL
Anion gap: 6 (ref 5–15)
BUN: 10 mg/dL (ref 6–20)
CO2: 23 mmol/L (ref 22–32)
Calcium: 8.6 mg/dL — ABNORMAL LOW (ref 8.9–10.3)
Chloride: 112 mmol/L — ABNORMAL HIGH (ref 98–111)
Creatinine, Ser: 0.86 mg/dL (ref 0.61–1.24)
GFR, Estimated: >60 mL/min (ref >60)
Glucose, Bld: 91 mg/dL (ref 70–99)
Potassium: 4 mmol/L (ref 3.5–5.1)
Sodium: 141 mmol/L (ref 135–145)

## 2022-03-27 LAB — CBC
HCT: 19.6 % — ABNORMAL LOW (ref 39.0–52.0)
Hemoglobin: 5.8 g/dL — ABNORMAL LOW (ref 13.0–17.0)
MCH: 22.7 pg — ABNORMAL LOW (ref 26.0–34.0)
MCHC: 29.6 g/dL — ABNORMAL LOW (ref 30.0–36.0)
MCV: 76.6 fL — ABNORMAL LOW (ref 80.0–100.0)
Platelets: 187 10*3/uL (ref 150–400)
RBC: 2.56 MIL/uL — ABNORMAL LOW (ref 4.22–5.81)
RDW: 17.7 % — ABNORMAL HIGH (ref 11.5–15.5)
WBC: 6.1 10*3/uL (ref 4.0–10.5)
nRBC: 0 % (ref 0.0–0.2)

## 2022-03-27 LAB — GLUCOSE, CAPILLARY: Glucose-Capillary: 103 mg/dL — ABNORMAL HIGH (ref 70–99)

## 2022-03-27 MED ORDER — PANTOPRAZOLE SODIUM 40 MG PO TBEC: 40.0000 mg | DELAYED_RELEASE_TABLET | Freq: Two times a day (BID) | ORAL | Status: DC

## 2022-03-27 NOTE — Progress Notes (Signed)
Patient discharged to home with all belongings. Patient and RN reviewed AVS and medication management. No questions to follow . PIVX1 removed with no complications.

## 2022-03-27 NOTE — Discharge Summary (Signed)
Physician Discharge Summary   Patient: Dan [name] MRN: [id number] DOB: 03-31-1966  Admit date:     03/25/2022  Discharge date: 03/27/22  Discharge Physician: Max Sane   PCP: Charlynne Cousins, MD   Recommendations at discharge:   Hospice at home  Discharge Diagnoses: Principal Problem:   GI bleeding Active Problems:   Acute blood loss anemia   Blood transfusion declined because patient is Jehovah's Witness   Pancreatic cancer metastasized to liver ([place])   Acid reflux   Asthma   HTN (hypertension)   Type 2 diabetes mellitus without complication (HCC)   Depression   Acute upper GI bleed  Hospital Course: 56 y.o. male with medical history significant of metastasized stage IV pancreatic cancer on hospice care, hypertension, diabetes mellitus, asthma, GERD, depression, kidney stone, gastric ulcer, Jehovah's Witness, who presents with dark tarry stool  8/1: GI consult 8/2: EGD today  Assessment and Plan: * GI bleeding Acute blood loss anemia due to GI bleeding: Patient is Jehovah's Witness, no blood transfusion.   EGD showed no bleeding and completely normal  Acute blood loss anemia No blood transfusion due to Jehovah's Witness  Received 1 dose of IV iron while in the hospital.  H&H remained stable.  Blood transfusion declined because patient is Jehovah's Witness -Given 1 dose of IV iron while in the hospital  Pancreatic cancer metastasized to liver [facility]) - Patient stopped chemotherapy, currently on hospice care  Acid reflux - On PPI  Asthma Stable  HTN (hypertension)  Type 2 diabetes mellitus without complication (HCC) Recent A1c 5.5.    Depression - Continue Amitriptyline         Consultants: GI Procedures performed: EGD on 8/2 Disposition: Hospice care Diet recommendation:  Discharge Diet Orders (From admission, onward)     Start     Ordered   03/27/22 0000  Diet - low sodium heart healthy        03/27/22 0851           Carb modified  diet DISCHARGE MEDICATION: Allergies as of 03/27/2022       Reactions   Emend [aprepitant] Other (See Comments)   Pt c/o severe pain in abdomen / back / chest    Cherry Nausea And Vomiting   Ibuprofen Nausea And Vomiting   Other Nausea And Vomiting   olives   Tramadol Other (See Comments)   Headaches.         Medication List     STOP taking these medications    lidocaine-prilocaine cream Commonly known as: EMLA   nystatin 100000 UNIT/ML suspension Commonly known as: MYCOSTATIN   ondansetron 8 MG tablet Commonly known as: Zofran   prochlorperazine 10 MG tablet Commonly known as: COMPAZINE   Spiriva HandiHaler 18 MCG inhalation capsule Generic drug: tiotropium       TAKE these medications    albuterol 108 (90 Base) MCG/ACT inhaler Commonly known as: VENTOLIN HFA Inhale into the lungs.   albuterol (2.5 MG/3ML) 0.083% nebulizer solution Commonly known as: PROVENTIL Take by nebulization.   amitriptyline 10 MG tablet Commonly known as: ELAVIL Take 10 mg by mouth at bedtime.   amLODipine 10 MG tablet Commonly known as: NORVASC Take 1 tablet (10 mg total) by mouth daily.   Flovent HFA 110 MCG/ACT inhaler Generic drug: fluticasone Inhale 1 puff into the lungs 2 (two) times daily.   Jardiance 10 MG Tabs tablet Generic drug: empagliflozin Take 1 tablet (10 mg total) by mouth daily.   losartan 100 MG  tablet Commonly known as: COZAAR Take 1 tablet (100 mg total) by mouth daily.   omeprazole 20 MG capsule Commonly known as: PRILOSEC Take 20 mg by mouth 2 (two) times daily.   oxyCODONE 15 mg 12 hr tablet Commonly known as: OXYCONTIN Take 1 tablet (15 mg total) by mouth every 8 (eight) hours.   oxyCODONE-acetaminophen 7.5-325 MG tablet Commonly known as: Percocet Take 1-2 tablets by mouth every 4 (four) hours as needed for severe pain.   sildenafil 100 MG tablet Commonly known as: VIAGRA Take 100 mg by mouth as directed.        Follow-up  Information     Vigg, Avanti, MD. Schedule an appointment as soon as possible for a visit in 1 week(s).   Specialty: Internal Medicine Why: [facility] Discharge F/UP Contact information: [address] [place] [address] [phone]         [name], MD. Schedule an appointment as soon as possible for a visit in 2 week(s).   Specialties: Internal Medicine, Oncology Why: [facility] Discharge F/UP - for IV iron/epo Contact information: [place]  [id number] [phone]                Discharge Exam: Danley Danker Weights   03/25/22 1006 03/26/22 1408  Weight: 79.9 kg 56.6 kg   56 year old male lying in the bed comfortably without any acute distress Lungs clear to auscultation bilaterally Cardiovascular regular rate and rhythm Abdomen soft, benign Neuro alert and oriented, nonfocal Skin pale  Neuro normal mood and affect  Condition at discharge: fair  The results of significant diagnostics from this hospitalization (including imaging, microbiology, ancillary and laboratory) are listed below for reference.   Imaging Studies: CT ABDOMEN PELVIS W CONTRAST  Result Date: 03/25/2022 CLINICAL DATA:  Abdominal pain loss of appetite and black tarry stool since Sunday. Patient has stage IV pancreatic cancer. EXAM: CT ABDOMEN AND PELVIS WITH CONTRAST TECHNIQUE: Multidetector CT imaging of the abdomen and pelvis was performed using the standard protocol following bolus administration of intravenous contrast. RADIATION DOSE REDUCTION: This exam was performed according to the departmental dose-optimization program which includes automated exposure control, adjustment of the mA and/or kV according to patient size and/or use of iterative reconstruction technique. CONTRAST:  181m OMNIPAQUE IOHEXOL 300 MG/ML  SOLN COMPARISON:  Multiple previous imaging studies. The most recent CT scan is 12/11/2021 FINDINGS: Lower chest: Streaky basilar atelectasis but no  worrisome pulmonary lesions or pulmonary nodules. No pleural effusions. The heart is normal in size. Hepatobiliary: Large conglomerate complex cystic and solid mass(es) occupying almost the entire right hepatic lobe. Lesion on image 21/2 measures 19 x 13 cm and previously measured 12.5 x 9.1 cm. No biliary dilatation. Pancreas: 2 large cystic and solid pancreatic body/tail masses are again demonstrated. The smaller more medial lesion measures 10.6 x 8.2 cm 8.5 x 6.5 cm. The larger more lateral mass measures 16.2 x 10.3 cm and previously measured 13.5 x 9.2 cm. Spleen: Normal size. No focal lesions. Adrenals/Urinary Tract: Stable 18 mm left adrenal gland lesion likely metastatic disease. The right adrenal gland is normal. No renal lesions. No bladder lesions. Stomach/Bowel: The stomach is severely compressed by the pancreatic masses. The duodenum, small bowel and colon are grossly. No mass, inflammation or obstruction. Vascular/Lymphatic: Age advanced atherosclerotic calcifications involving the aorta and branch vessels but no aneurysm dissection. The major venous structures are patent. Borderline enlarging left-sided retroperitoneal lymph node is likely metastatic disease. Reproductive: The prostate gland and  seminal vesicles are unremarkable. Other: No pelvic mass or adenopathy. No free pelvic fluid collections. No inguinal mass or adenopathy. No abdominal wall hernia or subcutaneous lesions. Musculoskeletal: No worrisome lytic or sclerotic bone lesions to suggest metastatic disease. 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 IMPRESSION: 1. Interval enlargement of large complex cystic and solid pancreatic body/tail masses consistent with known pancreatic cancer. 2. Significant interval enlargement hepatic metastatic lesions. 3. Stable 18 mm left adrenal gland lesion likely metastatic disease. 4. Enlarging retroperitoneal lymph nodes  likely metastatic adenopathy. 5. 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 6. Age advanced atherosclerotic calcifications involving the aorta and branch vessels. Aortic Atherosclerosis (ICD10-I70.0). Electronically Signed   By: Marijo Sanes M.D.   On: 03/25/2022 14:15   IR Removal Tun Access W/ Port W/O FL  Result Date: 02/27/2022 INDICATION: Port removal EXAM: Port removal MEDICATIONS: Per EMR ANESTHESIA/SEDATION: Moderate (conscious) sedation was employed during this procedure. A total of Versed 2 mg and Fentanyl 100 mcg was administered intravenously by the radiology nurse. Total intra-service moderate Sedation Time: 14 minutes. The patient's level of consciousness and vital signs were monitored continuously by radiology nursing throughout the procedure under my direct supervision. FLUOROSCOPY: N/a COMPLICATIONS: None immediate. PROCEDURE: Informed written consent was obtained from the patient after a thorough discussion of the procedural risks, benefits and alternatives. All questions were addressed. Maximal Sterile Barrier Technique was utilized including caps, mask, sterile gowns, sterile gloves, sterile drape, hand hygiene and skin antiseptic. A timeout was performed prior to the initiation of the procedure. The right chest was prepped and draped in the standard sterile fashion. Lidocaine was utilized for local anesthesia. An incision was made over the previously healed surgical incision. Utilizing blunt dissection, the port catheter and reservoir were removed from the underlying subcutaneous tissue in their entirety. After hemostasis, the subcutaneous pockets was closed in two layers using 3-0 and 4-0 Vicryl. Dermabond was also applied. The patient tolerated the procedure well without immediate complication. IMPRESSION: Successful removal of right-sided chest port. Electronically Signed   By: Albin Felling M.D.   On: 02/27/2022 15:01     Microbiology: Results for orders placed or performed during the hospital encounter of 03/25/22  SARS Coronavirus 2 by RT PCR (hospital order, performed in [facility] hospital lab) *cepheid single result test* Anterior Nasal Swab     Status: None   Collection Time: 03/25/22  1:07 PM   Specimen: Anterior Nasal Swab  Result Value Ref Range Status   SARS Coronavirus 2 by RT PCR NEGATIVE NEGATIVE Final    Comment: (NOTE) SARS-CoV-2 target nucleic acids are NOT DETECTED.  The SARS-CoV-2 RNA is generally detectable in upper and lower respiratory specimens during the acute phase of infection. The lowest concentration of SARS-CoV-2 viral copies this assay can detect is 250 copies / mL. A negative result does not preclude SARS-CoV-2 infection and should not be used as the sole basis for treatment or other patient management decisions.  A negative result may occur with improper specimen collection / handling, submission of specimen other than nasopharyngeal swab, presence of viral mutation(s) within the areas targeted by this assay, and inadequate number of viral copies (<250 copies / mL). A negative result must be combined with clinical observations, patient history, and epidemiological information.  Fact Sheet for Patients:   https://www.patel.info/  Fact Sheet for Healthcare Providers: https://hall.com/  This test is  not yet approved or  cleared by the [place] and has been authorized for detection and/or diagnosis of SARS-CoV-2 by FDA under an Emergency Use Authorization (EUA).  This EUA will remain in effect (meaning this test can be used) for the duration of the COVID-19 declaration under Section 564(b)(1) of the Act, 21 U.S.C. section 360bbb-3(b)(1), unless the authorization is terminated or revoked sooner.  Performed at [facility], [address], [place], [place] [address]   Surgical PCR screen     Status:  Abnormal   Collection Time: 03/25/22 11:28 PM   Specimen: Nasal Mucosa; Nasal Swab  Result Value Ref Range Status   MRSA, PCR NEGATIVE NEGATIVE Final   Staphylococcus aureus POSITIVE (A) NEGATIVE Final    Comment: (NOTE) The Xpert SA Assay (FDA approved for NASAL specimens in patients 40 years of age and older), is one component of a comprehensive surveillance program. It is not intended to diagnose infection nor to guide or monitor treatment. Performed at [facility], [address] [place], [place] [address]     Labs: CBC: Recent Labs  Lab 03/25/22 1947 03/26/22 0224 03/26/22 0834 03/26/22 1543 03/27/22 0422  WBC 5.8 5.2 5.7 4.9 6.1  HGB 5.5* 5.2* 5.3* 5.0* 5.8*  HCT 18.1* 17.8* 17.5* 16.8* 19.6*  MCV 75.4* 76.7* 75.1* 77.1* 76.6*  PLT 194 166 177 166 998   Basic Metabolic Panel: Recent Labs  Lab 03/25/22 1008 03/26/22 0224 03/27/22 0422  NA 137 140 141  K 3.9 3.9 4.0  CL 106 109 112*  CO2 '23 23 23  '$ GLUCOSE 89 67* 91  BUN '19 14 10  '$ CREATININE 1.21 1.06 0.86  CALCIUM 9.0 8.5* 8.6*   Liver Function Tests: Recent Labs  Lab 03/25/22 1008  AST 68*  ALT 19  ALKPHOS 172*  BILITOT 1.0  PROT 7.5  ALBUMIN 3.3*   CBG: Recent Labs  Lab 03/26/22 1233 03/26/22 1415 03/26/22 1548 03/26/22 2200 03/27/22 0741  GLUCAP 107* 78 65* 108* 103*    Discharge time spent: greater than 30 minutes.  Signed: Max Sane, MD [facility] 03/27/2022

## 2022-03-27 NOTE — Anesthesia Postprocedure Evaluation (Signed)
Anesthesia Post Note  Patient: Dan [name]  Procedure(s) Performed: ESOPHAGOGASTRODUODENOSCOPY (EGD) WITH PROPOFOL  Patient location during evaluation: PACU Anesthesia Type: General Level of consciousness: awake and oriented Pain management: satisfactory to patient Vital Signs Assessment: post-procedure vital signs reviewed and stable Respiratory status: spontaneous breathing and respiratory function stable Cardiovascular status: stable Anesthetic complications: no   No notable events documented.   Last Vitals:  Vitals:   03/26/22 2025 03/27/22 0414  BP: 112/65 115/68  Pulse: 90 79  Resp: 18 20  Temp: 37.4 C 37.3 C  SpO2: 99% 97%    Last Pain:  Vitals:   03/27/22 0455  TempSrc:   PainSc: 5                  VAN STAVEREN,Uri Turnbough

## 2022-03-27 NOTE — Plan of Care (Signed)
  Problem: Education: Goal: Knowledge of General Education information will improve Description: Including pain rating scale, medication(s)/side effects and non-pharmacologic comfort measures 03/27/2022 0949 by Evelena Peat, RN Outcome: Completed/Met 03/27/2022 0948 by Evelena Peat, RN Outcome: Progressing   Problem: Health Behavior/Discharge Planning: Goal: Ability to manage health-related needs will improve 03/27/2022 0949 by Evelena Peat, RN Outcome: Completed/Met 03/27/2022 0948 by Evelena Peat, RN Outcome: Progressing   Problem: Clinical Measurements: Goal: Ability to maintain clinical measurements within normal limits will improve 03/27/2022 0949 by Evelena Peat, RN Outcome: Completed/Met 03/27/2022 0948 by Evelena Peat, RN Outcome: Progressing Goal: Will remain free from infection 03/27/2022 0949 by Evelena Peat, RN Outcome: Completed/Met 03/27/2022 0948 by Evelena Peat, RN Outcome: Progressing Goal: Diagnostic test results will improve 03/27/2022 0949 by Evelena Peat, RN Outcome: Completed/Met 03/27/2022 0948 by Evelena Peat, RN Outcome: Progressing Goal: Respiratory complications will improve 03/27/2022 0949 by Evelena Peat, RN Outcome: Completed/Met 03/27/2022 0948 by Evelena Peat, RN Outcome: Progressing Goal: Cardiovascular complication will be avoided 03/27/2022 0949 by Evelena Peat, RN Outcome: Completed/Met 03/27/2022 0948 by Evelena Peat, RN Outcome: Progressing

## 2022-03-27 NOTE — Plan of Care (Signed)

## 2022-03-27 NOTE — TOC Transition Note (Signed)
Transition of Care [facility]) - CM/SW Discharge Note   Patient Details  Name: Dan [name] MRN: [id number] Date of Birth: [birth year]  [facility] CM/SW Contact:  Candie Chroman, LCSW Phone Number: 03/27/2022, 9:06 AM   Clinical Narrative:  Patient has orders to discharge home today. [facility] representatives are aware. No further concerns. CSW signing off.   Final next level of care: Home w Hospice Care Barriers to Discharge: No Barriers Identified   Patient Goals and CMS Choice        Discharge Placement                    Patient and family notified of of transfer: 03/27/22  Discharge Plan and Services                                     Social Determinants of Health (SDOH) Interventions     Readmission Risk Interventions     No data to display

## 2022-03-28 ENCOUNTER — Telehealth: Payer: Self-pay | Admitting: Internal Medicine

## 2022-03-28 ENCOUNTER — Other Ambulatory Visit: Payer: Self-pay | Admitting: *Deleted

## 2022-03-28 DIAGNOSIS — D649 Anemia, unspecified: Secondary | ICD-10-CM

## 2022-03-28 DIAGNOSIS — C259 Malignant neoplasm of pancreas, unspecified: Secondary | ICD-10-CM

## 2022-03-28 NOTE — Telephone Encounter (Signed)
Per chat- lab, md, borders- CBC, CMP, Iron, [facility]IC, ferr.   Appointment scheduled and patient notified- [facility]

## 2022-04-02 ENCOUNTER — Other Ambulatory Visit: Payer: Self-pay

## 2022-04-02 ENCOUNTER — Inpatient Hospital Stay: Payer: BLUE CROSS/BLUE SHIELD | Attending: Internal Medicine

## 2022-04-02 ENCOUNTER — Inpatient Hospital Stay (HOSPITAL_BASED_OUTPATIENT_CLINIC_OR_DEPARTMENT_OTHER): Payer: BLUE CROSS/BLUE SHIELD | Admitting: Internal Medicine

## 2022-04-02 ENCOUNTER — Inpatient Hospital Stay (HOSPITAL_BASED_OUTPATIENT_CLINIC_OR_DEPARTMENT_OTHER): Payer: BLUE CROSS/BLUE SHIELD | Admitting: Hospice and Palliative Medicine

## 2022-04-02 VITALS — BP 113/68 | HR 101 | Temp 96.8°F | Resp 18

## 2022-04-02 VITALS — BP 113/68 | HR 101 | Temp 96.8°F | Resp 18 | Ht 72.0 in | Wt 174.5 lb

## 2022-04-02 DIAGNOSIS — R5383 Other fatigue: Secondary | ICD-10-CM | POA: Insufficient documentation

## 2022-04-02 DIAGNOSIS — C259 Malignant neoplasm of pancreas, unspecified: Secondary | ICD-10-CM | POA: Diagnosis not present

## 2022-04-02 DIAGNOSIS — D649 Anemia, unspecified: Secondary | ICD-10-CM

## 2022-04-02 DIAGNOSIS — C252 Malignant neoplasm of tail of pancreas: Secondary | ICD-10-CM | POA: Diagnosis not present

## 2022-04-02 DIAGNOSIS — D630 Anemia in neoplastic disease: Secondary | ICD-10-CM | POA: Insufficient documentation

## 2022-04-02 DIAGNOSIS — Z515 Encounter for palliative care: Secondary | ICD-10-CM

## 2022-04-02 DIAGNOSIS — C787 Secondary malignant neoplasm of liver and intrahepatic bile duct: Secondary | ICD-10-CM | POA: Diagnosis not present

## 2022-04-02 DIAGNOSIS — M7989 Other specified soft tissue disorders: Secondary | ICD-10-CM | POA: Diagnosis not present

## 2022-04-02 LAB — COMPREHENSIVE METABOLIC PANEL
ALT: 20 U/L (ref 0–44)
AST: 35 U/L (ref 15–41)
Albumin: 2.9 g/dL — ABNORMAL LOW (ref 3.5–5.0)
Alkaline Phosphatase: 307 U/L — ABNORMAL HIGH (ref 38–126)
Anion gap: 11 (ref 5–15)
BUN: 10 mg/dL (ref 6–20)
CO2: 25 mmol/L (ref 22–32)
Calcium: 9.3 mg/dL (ref 8.9–10.3)
Chloride: 102 mmol/L (ref 98–111)
Creatinine, Ser: 0.97 mg/dL (ref 0.61–1.24)
GFR, Estimated: >60 mL/min (ref >60)
Glucose, Bld: 131 mg/dL — ABNORMAL HIGH (ref 70–99)
Potassium: 3.9 mmol/L (ref 3.5–5.1)
Sodium: 138 mmol/L (ref 135–145)
Total Bilirubin: 0.3 mg/dL (ref 0.3–1.2)
Total Protein: 7.3 g/dL (ref 6.5–8.1)

## 2022-04-02 LAB — CBC WITH DIFFERENTIAL/PLATELET
Abs Immature Granulocytes: 0.02 K/uL (ref 0.00–0.07)
Basophils Absolute: 0 K/uL (ref 0.0–0.1)
Basophils Relative: 1 %
Eosinophils Absolute: 0.1 K/uL (ref 0.0–0.5)
Eosinophils Relative: 2 %
HCT: 21 % — ABNORMAL LOW (ref 39.0–52.0)
Hemoglobin: 6.3 g/dL — ABNORMAL LOW (ref 13.0–17.0)
Immature Granulocytes: 0 %
Lymphocytes Relative: 15 %
Lymphs Abs: 1 K/uL (ref 0.7–4.0)
MCH: 23.3 pg — ABNORMAL LOW (ref 26.0–34.0)
MCHC: 30 g/dL (ref 30.0–36.0)
MCV: 77.8 fL — ABNORMAL LOW (ref 80.0–100.0)
Monocytes Absolute: 0.4 K/uL (ref 0.1–1.0)
Monocytes Relative: 7 %
Neutro Abs: 4.8 K/uL (ref 1.7–7.7)
Neutrophils Relative %: 75 %
Platelets: 293 K/uL (ref 150–400)
RBC: 2.7 MIL/uL — ABNORMAL LOW (ref 4.22–5.81)
RDW: 18.5 % — ABNORMAL HIGH (ref 11.5–15.5)
WBC: 6.3 K/uL (ref 4.0–10.5)
nRBC: 0 % (ref 0.0–0.2)

## 2022-04-02 LAB — IRON AND TIBC
Iron: 17 ug/dL — ABNORMAL LOW (ref 45–182)
Saturation Ratios: 6 % — ABNORMAL LOW (ref 17.9–39.5)
TIBC: 301 ug/dL (ref 250–450)
UIBC: 284 ug/dL

## 2022-04-02 LAB — FERRITIN: Ferritin: 380 ng/mL — ABNORMAL HIGH (ref 24–336)

## 2022-04-02 LAB — SAMPLE TO BLOOD BANK

## 2022-04-02 MED ORDER — OXYCODONE HCL ER 15 MG PO T12A: 15.0000 mg | EXTENDED_RELEASE_TABLET | Freq: Three times a day (TID) | ORAL | 0 refills | Status: DC

## 2022-04-02 MED ORDER — OXYCODONE-ACETAMINOPHEN 7.5-325 MG PO TABS: 1.0000 | ORAL_TABLET | ORAL | 0 refills | Status: DC | PRN

## 2022-04-02 NOTE — Assessment & Plan Note (Addendum)
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 STAGE IV-progressed on [place];-April 2023 [ER- ]Mild increase in size of soft tissue masses involving the pancreatic tail and left upper quadrant; mild increase in size of right hepatic lobe mass, consistent with metastatic disease; Stable 1.9 cm left adrenal mass.  Clinically progression of disease-continue monitoring of surveillance.  #Abdominal pain/back pain- sec to malignancy/ Percocet 7.5 QID prn; . CONTINUE OXYCONTIN 15 mg TID--continue current pain medication.  # Left  arm pain: Clinically suspicious of superficial venous thrombosis--improved with icing.  Recommend Voltaren gel.  Also recommend ultrasound of the upper extremity ASAP.  If patient has a DVT-?  Hold for r anticoagulation in the context of anemia would be complicated.  # Severe anemia- Hb 6.4; unclear etiology EGD March 26, 2022 negative.  Status post IV iron in the hospital.  Proceed with iron infusion 300 mg IV weekly x 3.   # Prognosis continues to be poor.  Patient will continue hospice after iron infusions done.  Discussed with [employer].  Patient follow-up with Josh in the clinic for follow-up; and with me as needed.  mychart appt # DISPOSITION: #  US dopplers LEFT Upper extremity ASAP # venofer weekly x3 [300 mg-  needs longer chair time] #  follow up TBD- Dr.B

## 2022-04-02 NOTE — Progress Notes (Signed)
Symptom Management and [place] at [facility] [phone] [phone]  Patient Care Team: Charlynne Cousins, MD as PCP - General (Internal Medicine) Clent Jacks, RN as Oncology Nurse Navigator [name], MD as Consulting Physician (Internal Medicine) Tester, Micah Flesher, PA-C as Physician Assistant (Physician Assistant)   NAME OF PATIENT: Dan [name]  [id number]  [birth year]   DATE OF VISIT: 04/02/22  REASON FOR CONSULT: Dan [name] is a 56 y.o. 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  Patient was recently hospitalized 03/25/2022 to 03/27/2022 with GI bleed.  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  He did receive a dose of Venofer.  Patient has been followed by hospice.  INTERVAL HISTORY:  Patient presents for posthospital follow-up.  He has had some fatigue and poor oral intake.  He has had continued upper abdominal pain but reports that that is reasonably well-controlled on around-the-clock opioids.  No further GI bleed noted.  He has had some left upper extremity edema.  SOCIAL HISTORY:     reports that he has quit smoking. His smoking use included cigarettes. He has a 12.50 pack-year smoking history. He has never used smokeless tobacco. He reports that he does not currently use alcohol after a past usage of about 6.0 standard drinks of alcohol per week. He reports that he does not use drugs.  Patient is married and lives at home with his wife.  He has been followed by hospice.  ADVANCE DIRECTIVES:    CODE STATUS:    PAST MEDICAL HISTORY: Past Medical History:  Diagnosis Date   Acid reflux    Asthma    Diabetes mellitus without complication ([place])    Family history of breast cancer    History of kidney stones    Hypertension    Kidney stones    Pancreas cancer ([place])     PAST SURGICAL HISTORY:   Past Surgical History:  Procedure Laterality Date   COLONOSCOPY WITH PROPOFOL N/A 12/02/2019   Procedure: COLONOSCOPY WITH PROPOFOL;  Surgeon: [name], MD;  Location: [facility] ENDOSCOPY;  Service: Gastroenterology;  Laterality: N/A;   ESOPHAGOGASTRODUODENOSCOPY (EGD) WITH PROPOFOL N/A 03/26/2022   Procedure: ESOPHAGOGASTRODUODENOSCOPY (EGD) WITH PROPOFOL;  Surgeon: [name], MD;  Location: [facility] ENDOSCOPY;  Service: Gastroenterology;  Laterality: N/A;   FRACTURE SURGERY Left at age 42   pelvic fracture   HEMORRHOID SURGERY N/A 01/31/2020   Procedure: HEMORRHOIDECTOMY;  Surgeon: Jules Husbands, MD;  Location: [facility] ORS;  Service: General;  Laterality: N/A;   IR IMAGING GUIDED PORT INSERTION  11/30/2020   IR REMOVAL TUN ACCESS W/ PORT W/O FL MOD SED  02/27/2022   pelvis fractur     ROTATOR CUFF REPAIR      HEMATOLOGY/ONCOLOGY HISTORY:  Oncology History Overview Note  # April 2022- [name],  BIOPSY:  - METASTATIC PANCREATIC ACINAR CELL CARCINOMA.; STAGE IV; CT scan shows-approximately 8cm centimeter pancreatic mass; adrenal lesion; also approximately 6 cm liver lesion.   ; LIPASE NORMAL.   # April 18ht, 2022- FOLFIRINOX; udenyca x4 4 cycles-June 2022-progressive disease noted-[S/p palliative chemotherapy with FOLFIRINOX s/p 4 cyc;es-June 23rd- 2022-INCREASE in size of large pancreatic tail mass, and adjacent peripancreatic lymphadenopathy. Increased size of large complex cystic and solid mass in right hepatic lobe, consistent with hepatic metastasis. 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 Adrenal metastasis cannot  be excluded]-discontinue FOLFIRINOX; declines chemotherapy-cis gem  # July 21st 2022-Lynparza 300 mg twice daily [BRCA-1 mutation-genetic mutation s/p genetic counseling ]  # July 19th 2022- Lynparza 300 mg BID ; JAN 2023- CT scan-progression of disease.  Recommend discontinuation of Lynparza.  Patient declined chemotherapy.  # NGS/MOLECULAR  TESTS: BRCA-1*/genetic mutation  #BRCA1 genetic mutation-s/p genetic counseling [July 2022]    # PALLIATIVE CARE EVALUATION:  # PAIN MANAGEMENT:    DIAGNOSIS: Pancreatic acinar carcinoma  STAGE:  IV       ;  GOALS:pallaitive  CURRENT/MOST RECENT THERAPY :     Pancreatic cancer metastasized to liver ([place])  11/23/2020 Initial Diagnosis   Pancreatic cancer metastasized to liver ([place])   11/28/2020 Cancer Staging   Staging form: Exocrine Pancreas, AJCC 8th Edition - Clinical: Stage IV (cM1) - Signed by [name], MD on 11/28/2020 Histopathologic type: Acinar cell carcinoma   12/10/2020 -  Chemotherapy    Patient is on Treatment Plan: PANCREAS MODIFIED FOLFIRINOX Q14D X 4 CYCLES        ALLERGIES:  is allergic to emend [aprepitant], cherry, ibuprofen, other, and tramadol.  MEDICATIONS:  Current Outpatient Medications  Medication Sig Dispense Refill   albuterol (PROVENTIL) (2.5 MG/3ML) 0.083% nebulizer solution Take by nebulization.     albuterol (VENTOLIN HFA) 108 (90 Base) MCG/ACT inhaler Inhale into the lungs.     amitriptyline (ELAVIL) 10 MG tablet Take 10 mg by mouth at bedtime.     amLODipine (NORVASC) 10 MG tablet Take 1 tablet (10 mg total) by mouth daily. 90 tablet 3   FLOVENT HFA 110 MCG/ACT inhaler Inhale 1 puff into the lungs 2 (two) times daily.     JARDIANCE 10 MG TABS tablet Take 1 tablet (10 mg total) by mouth daily. 30 tablet 2   losartan (COZAAR) 100 MG tablet Take 1 tablet (100 mg total) by mouth daily. 90 tablet 1   omeprazole (PRILOSEC) 20 MG capsule Take 20 mg by mouth 2 (two) times daily.     oxyCODONE (OXYCONTIN) 15 mg 12 hr tablet Take 1 tablet (15 mg total) by mouth every 8 (eight) hours. 45 tablet 0   oxyCODONE-acetaminophen (PERCOCET) 7.5-325 MG tablet Take 1-2 tablets by mouth every 4 (four) hours as needed for severe pain. 90 tablet 0   sildenafil (VIAGRA) 100 MG tablet Take 100 mg by mouth as directed.     No current facility-administered  medications for this visit.    VITAL SIGNS: BP 113/68   Pulse (!) 101   Temp (!) 96.8 F (36 C) (Tympanic)   Resp 18   Ht 6' (1.829 m)   Wt 174 lb 8 oz (79.2 kg)   BMI 23.67 kg/m  Filed Weights   04/02/22 1055  Weight: 174 lb 8 oz (79.2 kg)    Estimated body mass index is 23.67 kg/m as calculated from the following:   Height as of this encounter: 6' (1.829 m).   Weight as of this encounter: 174 lb 8 oz (79.2 kg).  LABS: CBC:    Component Value Date/Time   WBC 6.3 04/02/2022 1031   HGB 6.3 (L) 04/02/2022 1031   HCT 21.0 (L) 04/02/2022 1031   PLT 293 04/02/2022 1031   MCV 77.8 (L) 04/02/2022 1031   NEUTROABS 4.8 04/02/2022 1031   LYMPHSABS 1.0 04/02/2022 1031   MONOABS 0.4 04/02/2022 1031   EOSABS 0.1 04/02/2022 1031   BASOSABS 0.0 04/02/2022 1031   Comprehensive Metabolic Panel:    Component Value Date/Time  NA 138 04/02/2022 1031   NA 142 09/08/2019 1509   K 3.9 04/02/2022 1031   CL 102 04/02/2022 1031   CO2 25 04/02/2022 1031   BUN 10 04/02/2022 1031   BUN 14 09/08/2019 1509   CREATININE 0.97 04/02/2022 1031   GLUCOSE 131 (H) 04/02/2022 1031   CALCIUM 9.3 04/02/2022 1031   AST 35 04/02/2022 1031   ALT 20 04/02/2022 1031   ALKPHOS 307 (H) 04/02/2022 1031   BILITOT 0.3 04/02/2022 1031   BILITOT 0.4 09/08/2019 1509   PROT 7.3 04/02/2022 1031   PROT 6.5 09/08/2019 1509   ALBUMIN 2.9 (L) 04/02/2022 1031   ALBUMIN 4.4 09/08/2019 1509    RADIOGRAPHIC STUDIES: CT ABDOMEN PELVIS W CONTRAST  Result Date: 03/25/2022 CLINICAL DATA:  Abdominal pain loss of appetite and black tarry stool since Sunday. Patient has stage IV pancreatic cancer. EXAM: CT ABDOMEN AND PELVIS WITH CONTRAST TECHNIQUE: Multidetector CT imaging of the abdomen and pelvis was performed using the standard protocol following bolus administration of intravenous contrast. RADIATION DOSE REDUCTION: This exam was performed according to the departmental dose-optimization program which includes  automated exposure control, adjustment of the mA and/or kV according to patient size and/or use of iterative reconstruction technique. CONTRAST:  130m OMNIPAQUE IOHEXOL 300 MG/ML  SOLN COMPARISON:  Multiple previous imaging studies. The most recent CT scan is 12/11/2021 FINDINGS: Lower chest: Streaky basilar atelectasis but no worrisome pulmonary lesions or pulmonary nodules. No pleural effusions. The heart is normal in size. Hepatobiliary: Large conglomerate complex cystic and solid mass(es) occupying almost the entire right hepatic lobe. Lesion on image 21/2 measures 19 x 13 cm and previously measured 12.5 x 9.1 cm. No biliary dilatation. Pancreas: 2 large cystic and solid pancreatic body/tail masses are again demonstrated. The smaller more medial lesion measures 10.6 x 8.2 cm 8.5 x 6.5 cm. The larger more lateral mass measures 16.2 x 10.3 cm and previously measured 13.5 x 9.2 cm. Spleen: Normal size. No focal lesions. Adrenals/Urinary Tract: Stable 18 mm left adrenal gland lesion likely metastatic disease. The right adrenal gland is normal. No renal lesions. No bladder lesions. Stomach/Bowel: The stomach is severely compressed by the pancreatic masses. The duodenum, small bowel and colon are grossly. No mass, inflammation or obstruction. Vascular/Lymphatic: Age advanced atherosclerotic calcifications involving the aorta and branch vessels but no aneurysm dissection. The major venous structures are patent. Borderline enlarging left-sided retroperitoneal lymph node is likely metastatic disease. Reproductive: The prostate gland and seminal vesicles are unremarkable. Other: No pelvic mass or adenopathy. No free pelvic fluid collections. No inguinal mass or adenopathy. No abdominal wall hernia or subcutaneous lesions. Musculoskeletal: No worrisome lytic or sclerotic bone lesions to suggest metastatic disease. 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 IMPRESSION: 1. Interval enlargement of large complex cystic and solid pancreatic body/tail masses consistent with known pancreatic cancer. 2. Significant interval enlargement hepatic metastatic lesions. 3. Stable 18 mm left adrenal gland lesion likely metastatic disease. 4. Enlarging retroperitoneal lymph nodes likely metastatic adenopathy. 5. 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 6. Age advanced atherosclerotic calcifications involving the aorta and branch vessels. Aortic Atherosclerosis (ICD10-I70.0). Electronically Signed   By: PMarijo SanesM.D.   On: 03/25/2022 14:15    PERFORMANCE STATUS (ECOG) : 2 - Symptomatic, <50% confined to bed  Review of Systems Unless otherwise noted, a complete review of systems is negative.  Physical Exam General: NAD Pulmonary: Unlabored Abdomen: Left upper extremity mild edema GU: no suprapubic tenderness Extremities: no edema, no joint deformities Skin: no rashes Neurological: Weakness but otherwise nonfocal  IMPRESSION: Patient presents to clinic for posthospital follow-up.  He continues to have symptomatic anemia with hemoglobin of 6.3 today.  Patient received Venofer 200 mg x 1 and the hospital.  He has been seen today by Dr. Rogue Bussing with plan for Venofer weekly x 3 weeks.  Patient recognizes that he will likely have to revoke hospice but wishes to proceed with treatment.  Symptomatically, he endorses poor oral intake and would like to trial an appetite stimulant.  He is a poor candidate for dexamethasone given his diabetes.  Megace was excluded due to concern for possible left upper extremity DVT.  Will trial low-dose mirtazapine as patient also endorses some difficulty initiating sleep.  Pain is reportedly tolerable on OxyContin 50 mg every 8 hours in addition to Percocet 1 to 2 tablets every 4 hours as needed for breakthrough pain.  No adverse effects of pain  medications reported.   PLAN: -Continue current scope of treatment -Patient will likely require revocation of hospice to proceed with Venofer treatments -Referral to community palliative care -Would recommend reinitiating hospice following Venofer -Start mirtazapine 7.5 mg nightly -Continue OxyContin/Percocet -Daily bowel regimen -Patient will benefit from future [place] conversation and addressing CODE STATUS. -RTC in 3 weeks for lab/NP   Patient expressed understanding and was in agreement with this plan. He also understands that He can call clinic at any time with any questions, concerns, or complaints.   Thank you for allowing me to participate in the care of this very pleasant patient.   Time Total: 20 minutes  Visit consisted of counseling and education dealing with the complex and emotionally intense issues of symptom management in the setting of serious illness.Greater than 50%  of this time was spent counseling and coordinating care related to the above assessment and plan.  Signed by: Altha Harm, PhD, NP-C

## 2022-04-02 NOTE — Patient Instructions (Signed)
#   Recommend Voltaren gel-apply on the affected area on the left arm-twice a day; along with ice pack.

## 2022-04-02 NOTE — Progress Notes (Addendum)
Pt reports no bm in 3-4 days. Patient used 1 dose of exlax yesterday. Pt is not using any miralax/or stool softeners. Pt denies any vomiting or fevers. Pt reports abdominal discomfort when he takes a deep breath. He reports intermittent shortness of breath when walking.  Patient has a knot on his left arm from previous iv site (when he was in the hospital). Area is sore. Pt has used ice at the site to provide comfort.

## 2022-04-02 NOTE — Progress Notes (Signed)
[facility] CONSULT NOTE  Patient Care Team: Dan Cousins, MD as PCP - General (Internal Medicine) Dan Jacks, RN as Oncology Nurse Navigator Dan Sickle, MD as Consulting Physician (Internal Medicine) Dan [name], Dan [name] as Physician Assistant (Physician Assistant)  CHIEF COMPLAINTS/PURPOSE OF CONSULTATION: Pancreatic cancer   Oncology History Overview Note  # April 2022- Dan [name],  BIOPSY:  - METASTATIC PANCREATIC ACINAR CELL CARCINOMA.; STAGE IV; CT scan shows-approximately 8cm centimeter pancreatic mass; adrenal lesion; also approximately 6 cm [name] lesion.   ; LIPASE NORMAL.   # April 18ht, 2022- FOLFIRINOX; udenyca x4 4 cycles-June 2022-progressive disease noted-[S/p palliative chemotherapy with FOLFIRINOX s/p 4 cyc;es-June 23rd- 2022-INCREASE in size of large pancreatic tail mass, and adjacent peripancreatic lymphadenopathy. Increased size of large complex cystic and solid mass in right hepatic lobe, consistent with hepatic metastasis. 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 Adrenal metastasis cannot be excluded]-discontinue FOLFIRINOX; declines chemotherapy-cis gem  # July 21st 2022-Lynparza 300 mg twice daily [BRCA-1 mutation-genetic mutation s/p genetic counseling ]  # July 19th 2022- Lynparza 300 mg BID ; JAN 2023- CT scan-progression of disease.  Recommend discontinuation of Lynparza.  Patient declined chemotherapy.  # NGS/MOLECULAR TESTS: BRCA-1*/genetic mutation  #BRCA1 genetic mutation-s/p genetic counseling [July 2022]    # PALLIATIVE CARE EVALUATION:  # PAIN MANAGEMENT:    DIAGNOSIS: Pancreatic acinar carcinoma  STAGE:  IV       ;  GOALS:pallaitive  CURRENT/MOST RECENT THERAPY :     Pancreatic cancer metastasized to [name] ([place])  11/23/2020 Initial Diagnosis   Pancreatic cancer metastasized to [name] ([place])   11/28/2020 Cancer Staging   Staging form: Exocrine Pancreas, AJCC 8th  Edition - Clinical: Stage IV (cM1) - Signed by Dan Sickle, MD on 11/28/2020 Histopathologic type: Acinar cell carcinoma   12/10/2020 -  Chemotherapy    Patient is on Treatment Plan: PANCREAS MODIFIED FOLFIRINOX Q14D X 4 CYCLES       HISTORY OF PRESENTING ILLNESS: Alone  Ambulating independently.  Dan [name] 57 y.o.  male with pancreatic ACINAR cell carcinoma-stage IV metastatic [name]-BRCA1 mutated-currently in hospice is here for a follow up.  In interim patient was evaluated in the hospital for worsening anemia.  Hemoglobin around 6.  Status post EGD no obvious source of bleeding noted.  Patient Jehov[employer]'s Witness hence did not receive any PRBC blood transfusion.  Patient received IV iron infusion   Patient complains of ongoing fatigue.  Also complains of pain in his left arm.  Improved with icing not resolved.  Continues to complain of chronic but worsening abdominal pain.  Also complains of worsening weight loss.  Poor appetite.  Review of Systems  Constitutional:  Positive for malaise/fatigue and weight loss. Negative for chills, diaphoresis and fever.  HENT:  Negative for nosebleeds.   Eyes:  Negative for double vision.  Respiratory:  Negative for cough, hemoptysis, sputum production, shortness of breath and wheezing.   Cardiovascular:  Negative for chest pain, palpitations, orthopnea and leg swelling.  Gastrointestinal:  Positive for nausea. Negative for blood in stool, constipation, diarrhea, melena and vomiting.  Genitourinary:  Negative for dysuria, frequency and urgency.  Musculoskeletal:  Positive for back pain, joint pain and neck pain.  Skin: Negative.  Negative for itching and rash.  Neurological:  Negative for dizziness, tingling, focal weakness, weakness and headaches.  Endo/Heme/Allergies:  Does not bruise/bleed easily.  Psychiatric/Behavioral:  Negative for depression. The patient is not nervous/anxious  and does not have insomnia.      MEDICAL HISTORY:   Past Medical History:  Diagnosis Date   Acid reflux    Asthma    Diabetes mellitus without complication (HCC)    Family history of breast cancer    History of kidney stones    Hypertension    Kidney stones    Pancreas cancer [facility])     SURGICAL HISTORY: Past Surgical History:  Procedure Laterality Date   COLONOSCOPY WITH PROPOFOL N/A 12/02/2019   Procedure: COLONOSCOPY WITH PROPOFOL;  Surgeon: Wyline Mood, MD;  Location: [facility] ENDOSCOPY;  Service: Gastroenterology;  Laterality: N/A;   ESOPHAGOGASTRODUODENOSCOPY (EGD) WITH PROPOFOL N/A 03/26/2022   Procedure: ESOPHAGOGASTRODUODENOSCOPY (EGD) WITH PROPOFOL;  Surgeon: Wyline Mood, MD;  Location: [facility] ENDOSCOPY;  Service: Gastroenterology;  Laterality: N/A;   FRACTURE SURGERY Left at age 33   pelvic fracture   HEMORRHOID SURGERY N/A 01/31/2020   Procedure: HEMORRHOIDECTOMY;  Surgeon: Leafy Ro, MD;  Location: [facility] ORS;  Service: General;  Laterality: N/A;   IR IMAGING GUIDED PORT INSERTION  11/30/2020   IR REMOVAL TUN ACCESS W/ PORT W/O FL MOD SED  02/27/2022   pelvis fractur     ROTATOR CUFF REPAIR      SOCIAL HISTORY: Social History   Socioeconomic History   Marital status: Married    Spouse name: Not on file   Number of children: Not on file   Years of education: Not on file   Highest education level: Not on file  Occupational History   Not on file  Tobacco Use   Smoking status: Former    Packs/day: 0.50    Years: 25.00    Total pack years: 12.50    Types: Cigarettes   Smokeless tobacco: Never  Vaping Use   Vaping Use: Never used  Substance and Sexual Activity   Alcohol use: Not Currently    Alcohol/week: 6.0 standard drinks of alcohol    Types: 6 Cans of beer per week   Drug use: No   Sexual activity: Yes  Other Topics Concern   Not on file  Social History Narrative   Works at [employer]. In [place]. Quit smoking 2021 June. Social. Alcohol.    Social Determinants of Health   Financial Resource Strain: Not on file   Food Insecurity: Not on file  Transportation Needs: Not on file  Physical Activity: Not on file  Stress: Not on file  Social Connections: Not on file  Intimate Partner Violence: Not on file    FAMILY HISTORY: Family History  Problem Relation Age of Onset   Alzheimer's disease Mother    Heart attack Father    Cancer Sister        2 sisters 64 and 54- both died of breast cancer   Diabetes Brother    Hypertension Brother    Asthma Son     ALLERGIES:  is allergic to [place] [aprepitant], cherry, ibuprofen, other, and tramadol.  MEDICATIONS:  Current Outpatient Medications  Medication Sig Dispense Refill   albuterol (PROVENTIL) (2.5 MG/3ML) 0.083% nebulizer solution Take by nebulization.     albuterol (VENTOLIN HFA) 108 (90 Base) MCG/ACT inhaler Inhale into the lungs.     amitriptyline (ELAVIL) 10 MG tablet Take 10 mg by mouth at bedtime.     amLODipine (NORVASC) 10 MG tablet Take 1 tablet (10 mg total) by mouth daily. 90 tablet 3   FLOVENT HFA 110 MCG/ACT inhaler Inhale 1 puff into the lungs 2 (two) times daily.  JARDIANCE 10 MG TABS tablet Take 1 tablet (10 mg total) by mouth daily. 30 tablet 2   losartan (COZAAR) 100 MG tablet Take 1 tablet (100 mg total) by mouth daily. 90 tablet 1   omeprazole (PRILOSEC) 20 MG capsule Take 20 mg by mouth 2 (two) times daily.     oxyCODONE (OXYCONTIN) 15 mg 12 hr tablet Take 1 tablet (15 mg total) by mouth every 8 (eight) hours. 45 tablet 0   oxyCODONE-acetaminophen (PERCOCET) 7.5-325 MG tablet Take 1-2 tablets by mouth every 4 (four) hours as needed for severe pain. 90 tablet 0   sildenafil (VIAGRA) 100 MG tablet Take 100 mg by mouth as directed.     No current facility-administered medications for this visit.      Marland Kitchen  PHYSICAL EXAMINATION: ECOG PERFORMANCE STATUS: 1 - Symptomatic but completely ambulatory  Vitals:   04/02/22 1055  BP: 113/68  Pulse: (!) 101  Resp: 18  Temp: (!) 96.8 F (36 C)   There were no vitals filed for  this visit.  Left upper extremity forearm pain/ cord-tender. Physical Exam HENT:     Head: Normocephalic and atraumatic.     Mouth/Throat:     Pharynx: No oropharyngeal exudate.  Eyes:     Pupils: Pupils are equal, round, and reactive to light.  Cardiovascular:     Rate and Rhythm: Normal rate and regular rhythm.  Pulmonary:     Effort: Pulmonary effort is normal. No respiratory distress.     Breath sounds: Normal breath sounds. No wheezing.  Abdominal:     General: Bowel sounds are normal. There is no distension.     Palpations: Abdomen is soft. There is no mass.     Tenderness: There is no abdominal tenderness. There is no guarding or rebound.  Musculoskeletal:        General: No tenderness. Normal range of motion.     Cervical back: Normal range of motion and neck supple.  Skin:    General: Skin is warm.  Neurological:     Mental Status: He is alert and oriented to person, place, and time.  Psychiatric:        Mood and Affect: Affect normal.      LABORATORY DATA:  I have reviewed the data as listed Lab Results  Component Value Date   WBC 6.3 04/02/2022   HGB 6.3 (L) 04/02/2022   HCT 21.0 (L) 04/02/2022   MCV 77.8 (L) 04/02/2022   PLT 293 04/02/2022   Recent Labs    12/11/21 1106 12/16/21 1507 02/14/22 1456 03/25/22 1008 03/26/22 0224 03/27/22 0422 04/02/22 1031  NA 140   < > 136 137 140 141 138  K 3.5   < > 3.6 3.9 3.9 4.0 3.9  CL 106   < > 104 106 109 112* 102  CO2 23   < > $R'24 23 23 23 25  'ap$ GLUCOSE 137*   < > 121* 89 67* 91 131*  BUN 11   < > $R'12 19 14 10 10  'uU$ CREATININE 0.94   < > 1.04 1.21 1.06 0.86 0.97  CALCIUM 9.2   < > 9.0 9.0 8.5* 8.6* 9.3  GFRNONAA >60   < > >60 >60 >60 >60 >60  PROT 7.7   < > 7.8 7.5  --   --  7.3  ALBUMIN 3.9   < > 3.6 3.3*  --   --  2.9*  AST 30   < > 28 68*  --   --  35  ALT 14   < > 17 19  --   --  20  ALKPHOS 142*   < > 172* 172*  --   --  307*  BILITOT 0.5   < > 0.5 1.0  --   --  0.3  BILIDIR <0.1  --   --   --   --    --   --   IBILI NOT CALCULATED  --   --   --   --   --   --    < > = values in this interval not displayed.    RADIOGRAPHIC STUDIES: I have personally reviewed the radiological images as listed and agreed with the findings in the report. CT ABDOMEN PELVIS W CONTRAST  Result Date: 03/25/2022 CLINICAL DATA:  Abdominal pain loss of appetite and black tarry stool since Sunday. Patient has stage IV pancreatic cancer. EXAM: CT ABDOMEN AND PELVIS WITH CONTRAST TECHNIQUE: Multidetector CT imaging of the abdomen and pelvis was performed using the standard protocol following bolus administration of intravenous contrast. RADIATION DOSE REDUCTION: This exam was performed according to the departmental dose-optimization program which includes automated exposure control, adjustment of the mA and/or kV according to patient size and/or use of iterative reconstruction technique. CONTRAST:  115mL OMNIPAQUE IOHEXOL 300 MG/ML  SOLN COMPARISON:  Multiple previous imaging studies. The most recent CT scan is 12/11/2021 FINDINGS: Lower chest: Streaky basilar atelectasis but no worrisome pulmonary lesions or pulmonary nodules. No pleural effusions. The heart is normal in size. Hepatobiliary: Large conglomerate complex cystic and solid mass(es) occupying almost the entire right hepatic lobe. Lesion on image 21/2 measures 19 x 13 cm and previously measured 12.5 x 9.1 cm. No biliary dilatation. Pancreas: 2 large cystic and solid pancreatic body/tail masses are again demonstrated. The smaller more medial lesion measures 10.6 x 8.2 cm 8.5 x 6.5 cm. The larger more lateral mass measures 16.2 x 10.3 cm and previously measured 13.5 x 9.2 cm. Spleen: Normal size. No focal lesions. Adrenals/Urinary Tract: Stable 18 mm left adrenal gland lesion likely metastatic disease. The right adrenal gland is normal. No renal lesions. No bladder lesions. Stomach/Bowel: The stomach is severely compressed by the pancreatic masses. The duodenum, small bowel  and colon are grossly. No mass, inflammation or obstruction. Vascular/Lymphatic: Age advanced atherosclerotic calcifications involving the aorta and branch vessels but no aneurysm dissection. The major venous structures are patent. Borderline enlarging left-sided retroperitoneal lymph node is likely metastatic disease. Reproductive: The prostate gland and seminal vesicles are unremarkable. Other: No pelvic mass or adenopathy. No free pelvic fluid collections. No inguinal mass or adenopathy. No abdominal wall hernia or subcutaneous lesions. Musculoskeletal: No worrisome lytic or sclerotic bone lesions to suggest metastatic disease. 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 IMPRESSION: 1. Interval enlargement of large complex cystic and solid pancreatic body/tail masses consistent with known pancreatic cancer. 2. Significant interval enlargement hepatic metastatic lesions. 3. Stable 18 mm left adrenal gland lesion likely metastatic disease. 4. Enlarging retroperitoneal lymph nodes likely metastatic adenopathy. 5. 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 6. Age advanced atherosclerotic calcifications involving the aorta and branch vessels. Aortic Atherosclerosis (ICD10-I70.0). Electronically Signed   By: Marijo Sanes M.D.   On: 03/25/2022 14:15    ASSESSMENT & PLAN:   Pancreatic cancer metastasized to [name]) #Pancreatic acinar cell cancer [- BRCA-1-genetic mutation] with metastasis to  [name]. STAGE IV-progressed on Lynparza;-April 2023 [ER- ]Mild increase in size of soft tissue masses involving the pancreatic tail and left upper quadrant; mild increase in size of right hepatic lobe mass, consistent with metastatic disease; Stable 1.9 cm left adrenal mass.  Clinically progression of disease-continue monitoring of surveillance.  #Abdominal pain/back pain- sec to  malignancy/ Percocet 7.5 QID prn; . CONTINUE OXYCONTIN 15 mg TID--continue current pain medication.  # Left  arm pain: Clinically suspicious of superficial venous thrombosis--improved with icing.  Recommend Voltaren gel.  Also recommend ultrasound of the upper extremity ASAP.  If patient has a DVT-?  Hold for r anticoagulation in the context of anemia would be complicated.  # Severe anemia- Hb 6.4; unclear etiology EGD March 26, 2022 negative.  Status post IV iron in the hospital.  Proceed with iron infusion 300 mg IV weekly x 3.   # Prognosis continues to be poor.  Patient will continue hospice after iron infusions done.  Discussed with [employer].  Patient follow-up with Josh in the clinic for follow-up; and with me as needed.  mychart appt # DISPOSITION: #  US dopplers LEFT Upper extremity ASAP # venofer weekly x3 [300 mg-  needs longer chair time] #  follow up TBD- Dr.B     All questions were answered. The patient knows to call the clinic with any problems, questions or concerns.    Dan Sickle, MD 04/02/2022 1:19 PM

## 2022-04-03 ENCOUNTER — Ambulatory Visit
Admission: RE | Admit: 2022-04-03 | Discharge: 2022-04-03 | Disposition: A | Discharge status: Home / Self Care | Payer: BLUE CROSS/BLUE SHIELD | Source: Ambulatory Visit | Attending: Internal Medicine | Admitting: Internal Medicine

## 2022-04-03 ENCOUNTER — Other Ambulatory Visit: Payer: Self-pay | Admitting: Internal Medicine

## 2022-04-03 DIAGNOSIS — C787 Secondary malignant neoplasm of liver and intrahepatic bile duct: Secondary | ICD-10-CM | POA: Insufficient documentation

## 2022-04-03 DIAGNOSIS — C259 Malignant neoplasm of pancreas, unspecified: Secondary | ICD-10-CM | POA: Insufficient documentation

## 2022-04-03 DIAGNOSIS — M7989 Other specified soft tissue disorders: Secondary | ICD-10-CM | POA: Diagnosis present

## 2022-04-09 ENCOUNTER — Inpatient Hospital Stay: Payer: BLUE CROSS/BLUE SHIELD

## 2022-04-09 VITALS — BP 113/73 | HR 80 | Temp 97.5°F | Resp 18

## 2022-04-09 DIAGNOSIS — D649 Anemia, unspecified: Secondary | ICD-10-CM

## 2022-04-09 DIAGNOSIS — C259 Malignant neoplasm of pancreas, unspecified: Secondary | ICD-10-CM

## 2022-04-09 DIAGNOSIS — C252 Malignant neoplasm of tail of pancreas: Secondary | ICD-10-CM | POA: Diagnosis not present

## 2022-04-09 MED ORDER — HEPARIN SOD (PORK) LOCK FLUSH 100 UNIT/ML IV SOLN
500.0000 [IU] | Freq: Once | INTRAVENOUS | Status: DC | PRN
  Filled 2022-04-09: qty 5

## 2022-04-09 MED ORDER — SODIUM CHLORIDE 0.9 % IV SOLN
300.0000 mg | Freq: Once | INTRAVENOUS | Status: AC
  Administered 2022-04-09: 300 mg via INTRAVENOUS
  Filled 2022-04-09: qty 300

## 2022-04-09 MED ORDER — SODIUM CHLORIDE 0.9 % IV SOLN
Freq: Once | INTRAVENOUS | Status: AC
  Filled 2022-04-09: qty 250

## 2022-04-09 MED ORDER — SODIUM CHLORIDE 0.9% FLUSH
10.0000 mL | Freq: Once | INTRAVENOUS | Status: AC | PRN
  Administered 2022-04-09: 10 mL
  Filled 2022-04-09: qty 10

## 2022-04-09 NOTE — Patient Instructions (Signed)

## 2022-04-15 ENCOUNTER — Other Ambulatory Visit: Payer: Self-pay | Admitting: *Deleted

## 2022-04-15 MED ORDER — OXYCODONE HCL ER 15 MG PO T12A
15.0000 mg | EXTENDED_RELEASE_TABLET | Freq: Three times a day (TID) | ORAL | 0 refills | Status: DC
Start: 2022-04-15 — End: 2022-04-16

## 2022-04-15 MED ORDER — OXYCODONE-ACETAMINOPHEN 7.5-325 MG PO TABS: 1.0000 | ORAL_TABLET | ORAL | 0 refills | Status: DC | PRN

## 2022-04-16 ENCOUNTER — Other Ambulatory Visit: Payer: Self-pay | Admitting: Hospice and Palliative Medicine

## 2022-04-16 ENCOUNTER — Encounter: Payer: Self-pay | Admitting: *Deleted

## 2022-04-16 ENCOUNTER — Telehealth: Payer: Self-pay | Admitting: *Deleted

## 2022-04-16 ENCOUNTER — Inpatient Hospital Stay: Payer: BLUE CROSS/BLUE SHIELD

## 2022-04-16 VITALS — BP 116/78 | HR 88 | Temp 97.4°F | Resp 18

## 2022-04-16 DIAGNOSIS — D649 Anemia, unspecified: Secondary | ICD-10-CM

## 2022-04-16 DIAGNOSIS — C259 Malignant neoplasm of pancreas, unspecified: Secondary | ICD-10-CM

## 2022-04-16 DIAGNOSIS — C252 Malignant neoplasm of tail of pancreas: Secondary | ICD-10-CM | POA: Diagnosis not present

## 2022-04-16 MED ORDER — SODIUM CHLORIDE 0.9 % IV SOLN
300.0000 mg | Freq: Once | INTRAVENOUS | Status: AC
  Administered 2022-04-16: 300 mg via INTRAVENOUS
  Filled 2022-04-16: qty 300

## 2022-04-16 MED ORDER — XTAMPZA ER 13.5 MG PO C12A
1.0000 | EXTENDED_RELEASE_CAPSULE | Freq: Two times a day (BID) | ORAL | 0 refills | Status: DC
Start: 2022-04-16 — End: 2022-05-13

## 2022-04-16 MED ORDER — SODIUM CHLORIDE 0.9 % IV SOLN
Freq: Once | INTRAVENOUS | Status: AC
  Filled 2022-04-16: qty 250

## 2022-04-16 NOTE — Telephone Encounter (Signed)
Dan [name] (Dan [name]) - [phone] oxyCODONE-Acetaminophen 7.5-'325MG'$  tablets Status: PA Response - ApprovedCreated: August 22nd, 2023 [id number] Sent: August 23rd, 2023 Open  Archive

## 2022-04-16 NOTE — Telephone Encounter (Signed)
PA also required for   [place]: [id number] - Rx #: [id number]   Drug oxyCODONE-Acetaminophen 7.5-'325MG'$  tablets   [employer] Electronic PA Form [phone] NCPDP) Original Claim Info  76 MAXIMUM DAILY DOSE OF 8.0000 Health Plan's Preferred Product ENDOCET TAB 7.5-325 [id number]

## 2022-04-16 NOTE — Telephone Encounter (Signed)
Rx sent 

## 2022-04-16 NOTE — Telephone Encounter (Signed)
-----   Message from Secundino Ginger sent at 04/16/2022  7:51 AM EDT ----- Regarding: [place] for Oxycodone sent to his chart. Liliyana Thobe I had a msg from him on my VM this morning that he wanted to speak to [employer] about his medication. I got this fax this morning.This is probably what he was calling about.

## 2022-04-16 NOTE — Progress Notes (Signed)
Insurance will no longer cover OxyContin. Rotate to [facility] ER 13.'5mg'$  Q12H #60

## 2022-04-16 NOTE — Telephone Encounter (Signed)
Rx sent for [facility] ER

## 2022-04-16 NOTE — Telephone Encounter (Addendum)
Dan [name] Key: [id number] - PA Case ID: [id number] - Rx #: [id number] For Drug- OxyCONTIN '15MG'$  er tablets  Form [employer] Electronic PA Form [phone] NCPDP)   Your information has been submitted to [facility]. To check for an updated outcome later, reopen this PA request from your dashboard.  If [employer] has not responded to your request within 24 hours, contact [place] at [phone]. If you think there may be a problem with your PA request, use our live chat feature at the bottom right.

## 2022-04-16 NOTE — Telephone Encounter (Signed)
XTAMPZA ER is preferred. Oxycontin ER denied per covermymeds.

## 2022-04-16 NOTE — Telephone Encounter (Signed)
Patient insurance called reporting that the Oxycontin has been denied because there is an alternative on his coverage, Xtampza.

## 2022-04-17 ENCOUNTER — Encounter: Payer: Self-pay | Admitting: Internal Medicine

## 2022-04-17 NOTE — Telephone Encounter (Signed)
1600- on 8/23- left vm for patient that percocet scripts was approved by insurance and Josh sent new script for xtampza to pharmacy

## 2022-04-18 ENCOUNTER — Telehealth: Payer: Self-pay | Admitting: Student

## 2022-04-18 NOTE — Telephone Encounter (Signed)
Spoke with patient about the Palliative referral and he requested that I call him back next Thurs. 04/24/22, he receives his last infusion on 04/23/22 and will be speaking with his MD to see if the MD will be sending in another Hospice referral.

## 2022-04-22 ENCOUNTER — Other Ambulatory Visit: Payer: Self-pay | Admitting: *Deleted

## 2022-04-22 DIAGNOSIS — D649 Anemia, unspecified: Secondary | ICD-10-CM

## 2022-04-23 ENCOUNTER — Other Ambulatory Visit: Payer: BLUE CROSS/BLUE SHIELD

## 2022-04-23 ENCOUNTER — Inpatient Hospital Stay: Payer: BLUE CROSS/BLUE SHIELD

## 2022-04-23 ENCOUNTER — Inpatient Hospital Stay (HOSPITAL_BASED_OUTPATIENT_CLINIC_OR_DEPARTMENT_OTHER): Payer: BLUE CROSS/BLUE SHIELD | Admitting: Hospice and Palliative Medicine

## 2022-04-23 ENCOUNTER — Encounter: Payer: Self-pay | Admitting: Hospice and Palliative Medicine

## 2022-04-23 ENCOUNTER — Encounter: Payer: BLUE CROSS/BLUE SHIELD | Admitting: Hospice and Palliative Medicine

## 2022-04-23 ENCOUNTER — Other Ambulatory Visit: Payer: Self-pay

## 2022-04-23 VITALS — BP 117/65 | HR 89 | Resp 20

## 2022-04-23 VITALS — BP 119/81 | HR 91 | Temp 96.8°F | Resp 20 | Ht 72.0 in | Wt 172.6 lb

## 2022-04-23 DIAGNOSIS — D649 Anemia, unspecified: Secondary | ICD-10-CM

## 2022-04-23 DIAGNOSIS — C259 Malignant neoplasm of pancreas, unspecified: Secondary | ICD-10-CM

## 2022-04-23 DIAGNOSIS — C787 Secondary malignant neoplasm of liver and intrahepatic bile duct: Secondary | ICD-10-CM | POA: Diagnosis not present

## 2022-04-23 DIAGNOSIS — C252 Malignant neoplasm of tail of pancreas: Secondary | ICD-10-CM | POA: Diagnosis not present

## 2022-04-23 DIAGNOSIS — G893 Neoplasm related pain (acute) (chronic): Secondary | ICD-10-CM

## 2022-04-23 LAB — CBC WITH DIFFERENTIAL/PLATELET
Abs Immature Granulocytes: 0.02 10*3/uL (ref 0.00–0.07)
Basophils Absolute: 0 10*3/uL (ref 0.0–0.1)
Basophils Relative: 1 %
Eosinophils Absolute: 0.1 10*3/uL (ref 0.0–0.5)
Eosinophils Relative: 1 %
HCT: 22.9 % — ABNORMAL LOW (ref 39.0–52.0)
Hemoglobin: 6.7 g/dL — ABNORMAL LOW (ref 13.0–17.0)
Immature Granulocytes: 0 %
Lymphocytes Relative: 17 %
Lymphs Abs: 0.9 10*3/uL (ref 0.7–4.0)
MCH: 22.6 pg — ABNORMAL LOW (ref 26.0–34.0)
MCHC: 29.3 g/dL — ABNORMAL LOW (ref 30.0–36.0)
MCV: 77.4 fL — ABNORMAL LOW (ref 80.0–100.0)
Monocytes Absolute: 0.4 10*3/uL (ref 0.1–1.0)
Monocytes Relative: 8 %
Neutro Abs: 3.7 10*3/uL (ref 1.7–7.7)
Neutrophils Relative %: 73 %
Platelets: 257 10*3/uL (ref 150–400)
RBC: 2.96 MIL/uL — ABNORMAL LOW (ref 4.22–5.81)
RDW: 18.4 % — ABNORMAL HIGH (ref 11.5–15.5)
WBC: 5.2 10*3/uL (ref 4.0–10.5)
nRBC: 0 % (ref 0.0–0.2)

## 2022-04-23 LAB — COMPREHENSIVE METABOLIC PANEL
ALT: 14 U/L (ref 0–44)
AST: 37 U/L (ref 15–41)
Albumin: 3 g/dL — ABNORMAL LOW (ref 3.5–5.0)
Alkaline Phosphatase: 267 U/L — ABNORMAL HIGH (ref 38–126)
Anion gap: 7 (ref 5–15)
BUN: 12 mg/dL (ref 6–20)
CO2: 25 mmol/L (ref 22–32)
Calcium: 9 mg/dL (ref 8.9–10.3)
Chloride: 103 mmol/L (ref 98–111)
Creatinine, Ser: 1 mg/dL (ref 0.61–1.24)
GFR, Estimated: >60 mL/min (ref >60)
Glucose, Bld: 96 mg/dL (ref 70–99)
Potassium: 3.7 mmol/L (ref 3.5–5.1)
Sodium: 135 mmol/L (ref 135–145)
Total Bilirubin: 0.4 mg/dL (ref 0.3–1.2)
Total Protein: 8.1 g/dL (ref 6.5–8.1)

## 2022-04-23 LAB — SAMPLE TO BLOOD BANK

## 2022-04-23 LAB — IRON AND TIBC
Iron: 23 ug/dL — ABNORMAL LOW (ref 45–182)
Saturation Ratios: 7 % — ABNORMAL LOW (ref 17.9–39.5)
TIBC: 332 ug/dL (ref 250–450)
UIBC: 309 ug/dL

## 2022-04-23 LAB — FERRITIN: Ferritin: 376 ng/mL — ABNORMAL HIGH (ref 24–336)

## 2022-04-23 MED ORDER — OXYCODONE HCL 10 MG PO TABS: 10.0000 mg | ORAL_TABLET | ORAL | 0 refills | Status: DC | PRN

## 2022-04-23 MED ORDER — SODIUM CHLORIDE 0.9 % IV SOLN
300.0000 mg | Freq: Once | INTRAVENOUS | Status: AC
  Administered 2022-04-23: 300 mg via INTRAVENOUS
  Filled 2022-04-23: qty 300

## 2022-04-23 MED ORDER — SODIUM CHLORIDE 0.9 % IV SOLN
Freq: Once | INTRAVENOUS | Status: AC
  Filled 2022-04-23: qty 250

## 2022-04-23 NOTE — Progress Notes (Signed)
Symptom Management and [facility] [phone] [phone]  Patient Care Team: Charlynne Cousins, MD as PCP - General (Internal Medicine) Clent Jacks, RN as Oncology Nurse Navigator [name], MD as Consulting Physician (Internal Medicine) Tester, Micah Flesher, PA-C as Physician Assistant (Physician Assistant)   NAME OF PATIENT: Dan [name]  [id number]  [birth year]   DATE OF VISIT: 04/23/22  REASON FOR CONSULT: AYANSH FEUTZ is a 56 y.o. 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  Patient was recently hospitalized 03/25/2022 to 03/27/2022 with GI bleed.  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  He did receive a dose of Venofer.  Patient has been followed by hospice.  INTERVAL HISTORY: Patient returns for third and final Venofer today.  He endorses persistent upper abdominal pain and has been taking the Percocet every 2 hours around-the-clock.  SOCIAL HISTORY:     reports that he has quit smoking. His smoking use included cigarettes. He has a 12.50 pack-year smoking history. He has never used smokeless tobacco. He reports that he does not currently use alcohol after a past usage of about 6.0 standard drinks of alcohol per week. He reports that he does not use drugs.  Patient is married and lives at home with his wife.  He has been followed by hospice.  ADVANCE DIRECTIVES:    CODE STATUS:    PAST MEDICAL HISTORY: Past Medical History:  Diagnosis Date   Acid reflux    Asthma    Cancer associated pain    Diabetes mellitus without complication ([place])    Family history of breast cancer    History of kidney stones    Hypertension    Kidney stones    Pancreas cancer ([place])     PAST SURGICAL HISTORY:  Past Surgical History:  Procedure Laterality Date   COLONOSCOPY WITH PROPOFOL N/A 12/02/2019    Procedure: COLONOSCOPY WITH PROPOFOL;  Surgeon: [name], MD;  Location: [facility] ENDOSCOPY;  Service: Gastroenterology;  Laterality: N/A;   ESOPHAGOGASTRODUODENOSCOPY (EGD) WITH PROPOFOL N/A 03/26/2022   Procedure: ESOPHAGOGASTRODUODENOSCOPY (EGD) WITH PROPOFOL;  Surgeon: [name], MD;  Location: [facility] ENDOSCOPY;  Service: Gastroenterology;  Laterality: N/A;   FRACTURE SURGERY Left at age 28   pelvic fracture   HEMORRHOID SURGERY N/A 01/31/2020   Procedure: HEMORRHOIDECTOMY;  Surgeon: Jules Husbands, MD;  Location: [facility] ORS;  Service: General;  Laterality: N/A;   IR IMAGING GUIDED PORT INSERTION  11/30/2020   IR REMOVAL TUN ACCESS W/ PORT W/O FL MOD SED  02/27/2022   pelvis fractur     ROTATOR CUFF REPAIR      HEMATOLOGY/ONCOLOGY HISTORY:  Oncology History Overview Note  # April 2022- [name],  BIOPSY:  - METASTATIC PANCREATIC ACINAR CELL CARCINOMA.; STAGE IV; CT scan shows-approximately 8cm centimeter pancreatic mass; adrenal lesion; also approximately 6 cm liver lesion.   ; LIPASE NORMAL.   # April 18ht, 2022- FOLFIRINOX; udenyca x4 4 cycles-June 2022-progressive disease noted-[S/p palliative chemotherapy with FOLFIRINOX s/p 4 cyc;es-June 23rd- 2022-INCREASE in size of large pancreatic tail mass, and adjacent peripancreatic lymphadenopathy. Increased size of large complex cystic and solid mass in right hepatic lobe, consistent with hepatic metastasis. 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 Adrenal metastasis cannot be excluded]-discontinue FOLFIRINOX; declines chemotherapy-cis gem  # July 21st 2022-Lynparza 300 mg twice daily [BRCA-1 mutation-genetic mutation  s/p genetic counseling ]  # July 19th 2022- Lynparza 300 mg BID ; JAN 2023- CT scan-progression of disease.  Recommend discontinuation of Lynparza.  Patient declined chemotherapy.  # NGS/MOLECULAR TESTS: BRCA-1*/genetic mutation  #BRCA1 genetic mutation-s/p genetic counseling [July  2022]    # PALLIATIVE CARE EVALUATION:  # PAIN MANAGEMENT:    DIAGNOSIS: Pancreatic acinar carcinoma  STAGE:  IV       ;  GOALS:pallaitive  CURRENT/MOST RECENT THERAPY :     Pancreatic cancer metastasized to liver ([place])  11/23/2020 Initial Diagnosis   Pancreatic cancer metastasized to liver ([place])   11/28/2020 Cancer Staging   Staging form: Exocrine Pancreas, AJCC 8th Edition - Clinical: Stage IV (cM1) - Signed by [name], MD on 11/28/2020 Histopathologic type: Acinar cell carcinoma   12/10/2020 - 01/24/2021 Chemotherapy   Patient is on Treatment Plan : PANCREAS Modified FOLFIRINOX q14d x 4 cycles       ALLERGIES:  is allergic to [facility] [aprepitant], cherry, ibuprofen, other, and tramadol.  MEDICATIONS:  Current Outpatient Medications  Medication Sig Dispense Refill   albuterol (PROVENTIL) (2.5 MG/3ML) 0.083% nebulizer solution Take by nebulization.     albuterol (VENTOLIN HFA) 108 (90 Base) MCG/ACT inhaler Inhale into the lungs.     amitriptyline (ELAVIL) 10 MG tablet Take 10 mg by mouth at bedtime.     amLODipine (NORVASC) 10 MG tablet Take 1 tablet (10 mg total) by mouth daily. 90 tablet 3   FLOVENT HFA 110 MCG/ACT inhaler Inhale 1 puff into the lungs 2 (two) times daily.     JARDIANCE 10 MG TABS tablet Take 1 tablet (10 mg total) by mouth daily. 30 tablet 2   losartan (COZAAR) 100 MG tablet Take 1 tablet (100 mg total) by mouth daily. 90 tablet 1   omeprazole (PRILOSEC) 20 MG capsule Take 20 mg by mouth 2 (two) times daily.     oxyCODONE ER (XTAMPZA ER) 13.5 MG C12A Take 1 capsule by mouth every 12 (twelve) hours. 60 capsule 0   oxyCODONE-acetaminophen (PERCOCET) 7.5-325 MG tablet Take 1-2 tablets by mouth every 4 (four) hours as needed for severe pain. 90 tablet 0   sildenafil (VIAGRA) 100 MG tablet Take 100 mg by mouth as directed.     No current facility-administered medications for this visit.   Facility-Administered Medications Ordered in Other Visits   Medication Dose Route Frequency Provider Last Rate Last Admin   iron sucrose (VENOFER) 300 mg in sodium chloride 0.9 % 250 mL IVPB  300 mg Intravenous Once Charlaine Dalton R, MD 176.7 mL/hr at 04/23/22 1436 300 mg at 04/23/22 1436    VITAL SIGNS: BP 119/81   Pulse 91   Temp (!) 96.8 F (36 C) (Tympanic)   Resp 20   Ht 6' (1.829 m)   Wt 172 lb 9.6 oz (78.3 kg)   SpO2 98%   BMI 23.41 kg/m  Filed Weights   04/23/22 1329  Weight: 172 lb 9.6 oz (78.3 kg)    Estimated body mass index is 23.41 kg/m as calculated from the following:   Height as of this encounter: 6' (1.829 m).   Weight as of this encounter: 172 lb 9.6 oz (78.3 kg).  LABS: CBC:    Component Value Date/Time   WBC 5.2 04/23/2022 1302   HGB 6.7 (L) 04/23/2022 1302   HCT 22.9 (L) 04/23/2022 1302   PLT 257 04/23/2022 1302   MCV 77.4 (L) 04/23/2022 1302   NEUTROABS 3.7 04/23/2022 1302   LYMPHSABS  0.9 04/23/2022 1302   MONOABS 0.4 04/23/2022 1302   EOSABS 0.1 04/23/2022 1302   BASOSABS 0.0 04/23/2022 1302   Comprehensive Metabolic Panel:    Component Value Date/Time   NA 135 04/23/2022 1302   NA 142 09/08/2019 1509   K 3.7 04/23/2022 1302   CL 103 04/23/2022 1302   CO2 25 04/23/2022 1302   BUN 12 04/23/2022 1302   BUN 14 09/08/2019 1509   CREATININE 1.00 04/23/2022 1302   GLUCOSE 96 04/23/2022 1302   CALCIUM 9.0 04/23/2022 1302   AST 37 04/23/2022 1302   ALT 14 04/23/2022 1302   ALKPHOS 267 (H) 04/23/2022 1302   BILITOT 0.4 04/23/2022 1302   BILITOT 0.4 09/08/2019 1509   PROT 8.1 04/23/2022 1302   PROT 6.5 09/08/2019 1509   ALBUMIN 3.0 (L) 04/23/2022 1302   ALBUMIN 4.4 09/08/2019 1509    RADIOGRAPHIC STUDIES: US Venous Img Upper Uni Left (DVT)  Result Date: 04/03/2022 CLINICAL DATA:  LEFT upper extremity pain and swelling following IV placement. EXAM: LEFT UPPER EXTREMITY VENOUS DOPPLER ULTRASOUND TECHNIQUE: Gray-scale sonography with graded compression, as well as color Doppler and duplex  ultrasound were performed to evaluate the upper extremity deep venous system from the level of the subclavian vein and including the jugular, axillary, basilic, radial, ulnar and upper cephalic vein. Spectral Doppler was utilized to evaluate flow at rest and with distal augmentation maneuvers. COMPARISON:  CTA chest 12/11/2021. FINDINGS: VENOUS Normal compressibility of the LEFT internal jugular, subclavian, axillary, basilic, brachial, radial and ulnar veins. No filling defects to suggest DVT on grayscale or color Doppler imaging. Doppler waveforms show normal direction of venous flow, normal respiratory plasticity and response to augmentation. Limited views of the contralateral subclavian vein are unremarkable. OTHER Thrombosed cephalic vein, with clot extending from the forearm to the proximal LEFT upper extremity. See key images. No abnormal fluid collection. Limitations: none IMPRESSION: 1. No evidence of DVT within the LEFT upper extremity. 2. LEFT upper extremity superficial thrombophlebitis involving the cephalic vein. Michaelle Birks, MD [facility] Electronically Signed   By: Michaelle Birks M.D.   On: 04/03/2022 17:14   CT ABDOMEN PELVIS W CONTRAST  Result Date: 03/25/2022 CLINICAL DATA:  Abdominal pain loss of appetite and black tarry stool since Sunday. Patient has stage IV pancreatic cancer. EXAM: CT ABDOMEN AND PELVIS WITH CONTRAST TECHNIQUE: Multidetector CT imaging of the abdomen and pelvis was performed using the standard protocol following bolus administration of intravenous contrast. RADIATION DOSE REDUCTION: This exam was performed according to the departmental dose-optimization program which includes automated exposure control, adjustment of the mA and/or kV according to patient size and/or use of iterative reconstruction technique. CONTRAST:  137m OMNIPAQUE IOHEXOL 300 MG/ML  SOLN COMPARISON:  Multiple previous imaging studies. The most  recent CT scan is 12/11/2021 FINDINGS: Lower chest: Streaky basilar atelectasis but no worrisome pulmonary lesions or pulmonary nodules. No pleural effusions. The heart is normal in size. Hepatobiliary: Large conglomerate complex cystic and solid mass(es) occupying almost the entire right hepatic lobe. Lesion on image 21/2 measures 19 x 13 cm and previously measured 12.5 x 9.1 cm. No biliary dilatation. Pancreas: 2 large cystic and solid pancreatic body/tail masses are again demonstrated. The smaller more medial lesion measures 10.6 x 8.2 cm 8.5 x 6.5 cm. The larger more lateral mass measures 16.2 x 10.3 cm and previously measured 13.5 x 9.2 cm. Spleen: Normal size. No focal lesions. Adrenals/Urinary Tract: Stable 18 mm left adrenal gland lesion  likely metastatic disease. The right adrenal gland is normal. No renal lesions. No bladder lesions. Stomach/Bowel: The stomach is severely compressed by the pancreatic masses. The duodenum, small bowel and colon are grossly. No mass, inflammation or obstruction. Vascular/Lymphatic: Age advanced atherosclerotic calcifications involving the aorta and branch vessels but no aneurysm dissection. The major venous structures are patent. Borderline enlarging left-sided retroperitoneal lymph node is likely metastatic disease. Reproductive: The prostate gland and seminal vesicles are unremarkable. Other: No pelvic mass or adenopathy. No free pelvic fluid collections. No inguinal mass or adenopathy. No abdominal wall hernia or subcutaneous lesions. Musculoskeletal: No worrisome lytic or sclerotic bone lesions to suggest metastatic disease. 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 IMPRESSION: 1. Interval enlargement of large complex cystic and solid pancreatic body/tail masses consistent with known pancreatic cancer. 2. Significant interval enlargement hepatic metastatic lesions. 3. Stable 18 mm left  adrenal gland lesion likely metastatic disease. 4. Enlarging retroperitoneal lymph nodes likely metastatic adenopathy. 5. 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 6. Age advanced atherosclerotic calcifications involving the aorta and branch vessels. Aortic Atherosclerosis (ICD10-I70.0). Electronically Signed   By: Marijo Sanes M.D.   On: 03/25/2022 14:15    PERFORMANCE STATUS (ECOG) : 2 - Symptomatic, <50% confined to bed  Review of Systems Unless otherwise noted, a complete review of systems is negative.  Physical Exam General: NAD Pulmonary: Unlabored GU: no suprapubic tenderness Extremities: no edema, no joint deformities Skin: no rashes Neurological: Weakness but otherwise nonfocal  IMPRESSION: Labs reviewed with patient.  Hemoglobin 6.7 today.  Patient is scheduled for Venofer 300 mg today.  Persistent anemia in setting of cancer.  Patient is Jehovah's Witness and confirms that he is not interested in blood products.  Discussed with Dr. Rogue Bussing who recommended referral back to hospice.  Discussed overall goals with patient including likelihood of persistent anemia as well as progressive decline in setting of terminal cancer.  Patient verbalized agreement with reinitiation of hospice services.  He did ask about prognosis and how long he has to live.  We discussed CODE STATUS.  Patient has historically remained a full code and confirmed that desire to me today.  However, he agreed to speak with family and consider less aggressive measures at end-of-life after we discussed the probable futility associated with resuscitation.  Symptomatically, he has had persistent upper abdominal pain.  Patient is taking Percocet every 2 hours around-the-clock.  I explained that I am uncomfortable with that frequency.  We will rotate oxycodone IR and liberalize dosing to 10 mg tablets.  I do think patient would be a candidate for celiac  plexus block, which could potentially significantly improve his overall pain.  We discussed this option today in depth but patient wanted to think about it.  If he opted to pursue this, I would likely recommend delaying reinitiation of hospice care until after the procedure has been completed.  Pain is reportedly tolerable on OxyContin 50 mg every 8 hours in addition to Percocet 1 to 2 tablets every 4 hours as needed for breakthrough pain.  No adverse effects of pain medications reported.   PLAN: -Continue current scope of treatment -Referral back to hospice -Continue Xtampza ER -DC Percocet -Start oxycodone IR 10 mg every 3 hours as needed #90 -Daily bowel regimen -Patient thinking about celiac plexus block -Follow-up telephone visit 1 month   Patient expressed understanding and was  in agreement with this plan. He also understands that He can call clinic at any time with any questions, concerns, or complaints.   Thank you for allowing me to participate in the care of this very pleasant patient.   Time Total: 25 minutes  Visit consisted of counseling and education dealing with the complex and emotionally intense issues of symptom management in the setting of serious illness.Greater than 50%  of this time was spent counseling and coordinating care related to the above assessment and plan.  Signed by: Altha Harm, PhD, NP-C

## 2022-04-24 ENCOUNTER — Telehealth: Payer: Self-pay | Admitting: Student

## 2022-04-25 NOTE — Telephone Encounter (Signed)
Spoke with patient regarding the Palliative referral and he stated that a Hospice referral has been sent in for him.  Instructed patient that I would cancel the Palliative referral and he was in agreement.  Notified Palliative Team.

## 2022-05-05 ENCOUNTER — Other Ambulatory Visit: Payer: Self-pay | Admitting: *Deleted

## 2022-05-05 ENCOUNTER — Other Ambulatory Visit: Payer: Self-pay | Admitting: Internal Medicine

## 2022-05-05 ENCOUNTER — Other Ambulatory Visit: Payer: Self-pay | Admitting: Hospice and Palliative Medicine

## 2022-05-05 MED ORDER — OXYCODONE HCL 10 MG PO TABS: 10.0000 mg | ORAL_TABLET | ORAL | 0 refills | Status: DC | PRN

## 2022-05-05 MED ORDER — OXYCODONE HCL ER 15 MG PO T12A: 15.0000 mg | EXTENDED_RELEASE_TABLET | Freq: Two times a day (BID) | ORAL | 0 refills | Status: DC

## 2022-05-05 NOTE — Progress Notes (Signed)
I spoke with patient's new hospice nurse, Otis Dials. Reportedly, patient does not like the way the [facility] ER makes him feel (he reports increased abd pain on this) and he has requested rotating back to OxyContin '15mg'$  Q12H. I am okay switching back and will send the prescription. However, I do not think that the Xtampza is causing pain and more likely the worsening pain is secondary to dz progression. I had previously recommended consideration of a Celiac Plexus Block and I again discussed this with his hospice nurse. Rx sent.   Reportedly, patient is taking the oxy IR 1-2 tabs every 3 hours around the clock.

## 2022-05-13 ENCOUNTER — Other Ambulatory Visit: Payer: Self-pay | Admitting: Hospice and Palliative Medicine

## 2022-05-13 MED ORDER — OXYCODONE HCL 10 MG PO TABS: 10.0000 mg | ORAL_TABLET | ORAL | 0 refills | Status: DC | PRN

## 2022-05-13 NOTE — Progress Notes (Signed)
I spoke with hospice nurse, Kerrie Buffalo.  Pain is reportedly improved as long as patient takes oxycodone 2 tablets every 3-4 hours around-the-clock.  Refill was requested.  Ideally, I would like to increase his long-acting to try to minimize short acting usage.  However, OxyContin is not on the hospice formulary and patient has historically been disagreeable with rotating to other medications.  He apparently is also not interested in a celiac plexus block at this time.

## 2022-05-19 ENCOUNTER — Other Ambulatory Visit: Payer: Self-pay | Admitting: Hospice and Palliative Medicine

## 2022-05-19 DIAGNOSIS — C259 Malignant neoplasm of pancreas, unspecified: Secondary | ICD-10-CM

## 2022-05-19 MED ORDER — OXYCODONE HCL 20 MG PO TABS: 1.0000 | ORAL_TABLET | ORAL | 0 refills | Status: DC | PRN

## 2022-05-19 NOTE — Progress Notes (Signed)
Hospice nurse called and requested refill of oxycodone.  Patient is frequently using oxycodone IR 2 tablets every 3 hours around-the-clock.  Pain has been worsening in the upper abdomen over the past couple of months, likely due to disease progression.  I previously talked to patient option of pursuing a celiac plexus block to try to lessen need for frequent opioids.  Patient has told the hospice nurse that he is now and agreeable to procedure.  We will send referral to interventional radiology.  We will send refill for oxycodone IR but increase tablets to 20 mg to reduce pill burden.

## 2022-05-20 ENCOUNTER — Telehealth: Payer: Self-pay | Admitting: Internal Medicine

## 2022-05-20 DIAGNOSIS — C259 Malignant neoplasm of pancreas, unspecified: Secondary | ICD-10-CM

## 2022-05-20 NOTE — Progress Notes (Signed)
Request from [facility], NP, for celiac plexus block for pain control. Order placed for OP consult at [facility] to evaluate pt for procedure. [facility] is coordinating scheduling since this pt is currently under OP hospice care. Marcie Bal has already confirmed that this procedure is covered.  Procedure approved by Dr. Earleen Newport, IR.    Narda Rutherford, AGNP-BC 05/20/2022, 12:23 PM

## 2022-05-22 ENCOUNTER — Ambulatory Visit
Admission: RE | Admit: 2022-05-22 | Discharge: 2022-05-22 | Disposition: A | Discharge status: Home / Self Care | Payer: BLUE CROSS/BLUE SHIELD | Source: Ambulatory Visit | Attending: Internal Medicine | Admitting: Internal Medicine

## 2022-05-22 ENCOUNTER — Telehealth: Payer: Self-pay | Admitting: *Deleted

## 2022-05-22 DIAGNOSIS — C259 Malignant neoplasm of pancreas, unspecified: Secondary | ICD-10-CM

## 2022-05-22 HISTORY — PX: IR RADIOLOGIST EVAL & MGMT: IMG5224

## 2022-05-22 MED ORDER — DEXAMETHASONE 2 MG PO TABS: 2.0000 mg | ORAL_TABLET | Freq: Every day | ORAL | 0 refills | Status: AC

## 2022-05-22 MED ORDER — ONDANSETRON HCL 8 MG PO TABS: 8.0000 mg | ORAL_TABLET | Freq: Three times a day (TID) | ORAL | 0 refills | Status: DC | PRN

## 2022-05-22 MED ORDER — PROCHLORPERAZINE MALEATE 10 MG PO TABS: 10.0000 mg | ORAL_TABLET | Freq: Four times a day (QID) | ORAL | 0 refills | Status: DC | PRN

## 2022-05-22 NOTE — Consult Note (Signed)
Chief Complaint: Patient was seen in consultation today for celiac plexus block  Referring Physician(s): Altha Harm, NP  History of Present Illness: Dan [name] is a 56 y.o. male with advanced stage pancreatic cancer, currently off any treatment, presenting via virtual telephone clinic visit for severe abdominal pain.  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  He received FOLFIRINOX for 4 cycles then declined due to significant side effects.  He now has severe abdominal pain which requires 20 mg oxycodone Q3H, including dosing through the night.  The pain is mostly on his right side, and has worsened over the past several months.  Sometimes the pain is so bad that he cries.  He is unable to lie on his right side and has to sleep on his back.     Past Medical History:  Diagnosis Date   Acid reflux    Asthma    Cancer associated pain    Diabetes mellitus without complication ([place])    Family history of breast cancer    History of kidney stones    Hypertension    Kidney stones    Pancreas cancer [facility])     Past Surgical History:  Procedure Laterality Date   COLONOSCOPY WITH PROPOFOL N/A 12/02/2019   Procedure: COLONOSCOPY WITH PROPOFOL;  Surgeon: [name], MD;  Location: [facility] ENDOSCOPY;  Service: Gastroenterology;  Laterality: N/A;   ESOPHAGOGASTRODUODENOSCOPY (EGD) WITH PROPOFOL N/A 03/26/2022   Procedure: ESOPHAGOGASTRODUODENOSCOPY (EGD) WITH PROPOFOL;  Surgeon: [name], MD;  Location: [facility] ENDOSCOPY;  Service: Gastroenterology;  Laterality: N/A;   FRACTURE SURGERY Left at age 89   pelvic fracture   HEMORRHOID SURGERY N/A 01/31/2020   Procedure: HEMORRHOIDECTOMY;  Surgeon: Jules Husbands, MD;  Location: [facility] ORS;  Service: General;  Laterality: N/A;   IR IMAGING GUIDED PORT INSERTION  11/30/2020   IR REMOVAL TUN ACCESS W/ PORT W/O FL MOD SED  02/27/2022   pelvis fractur     ROTATOR CUFF REPAIR       Allergies: Emend [aprepitant], Cherry, Ibuprofen, Other, and Tramadol  Medications: Prior to Admission medications   Medication Sig Start Date End Date Taking? Authorizing Provider  albuterol (PROVENTIL) (2.5 MG/3ML) 0.083% nebulizer solution Take by nebulization. 01/31/21   [provider]  albuterol (VENTOLIN HFA) 108 (90 Base) MCG/ACT inhaler Inhale into the lungs. 12/20/20   [provider]  amitriptyline (ELAVIL) 10 MG tablet Take 10 mg by mouth at bedtime. 01/14/22   [provider]  amLODipine (NORVASC) 10 MG tablet Take 1 tablet (10 mg total) by mouth daily. 12/16/21   [name], MD  dexamethasone (DECADRON) 2 MG tablet Take 1 tablet (2 mg total) by mouth daily. 05/22/22   Borders, Kirt Boys, NP  FLOVENT HFA 110 MCG/ACT inhaler Inhale 1 puff into the lungs 2 (two) times daily. 12/28/20   [provider]  JARDIANCE 10 MG TABS tablet Take 1 tablet (10 mg total) by mouth daily. 12/12/21   Borders, Kirt Boys, NP  losartan (COZAAR) 100 MG tablet Take 1 tablet (100 mg total) by mouth daily. 10/14/21   Vigg, Avanti, MD  omeprazole (PRILOSEC) 20 MG capsule Take 20 mg by mouth 2 (two) times daily. 05/21/21   [provider]  ondansetron (ZOFRAN) 8 MG tablet Take 1 tablet (8 mg total) by mouth every 8 (eight) hours as needed for nausea or vomiting. 05/22/22   Borders, Kirt Boys, NP  oxyCODONE (OXYCONTIN) 15  mg 12 hr tablet Take 1 tablet (15 mg total) by mouth every 12 (twelve) hours. 05/05/22   Borders, Kirt Boys, NP  Oxycodone HCl 20 MG TABS Take 1 tablet (20 mg total) by mouth every 3 (three) hours as needed (breakthrough pain). 05/19/22   Borders, Kirt Boys, NP  prochlorperazine (COMPAZINE) 10 MG tablet Take 1 tablet (10 mg total) by mouth every 6 (six) hours as needed for nausea or vomiting. 05/22/22   Borders, Kirt Boys, NP  sildenafil (VIAGRA) 100 MG tablet Take 100 mg by mouth as directed. 05/29/21   [provider]     Family History   Problem Relation Age of Onset   Alzheimer's disease Mother    Heart attack Father    Cancer Sister        2 sisters 3 and 79- both died of breast cancer   Diabetes Brother    Hypertension Brother    Asthma Son     Social History   Socioeconomic History   Marital status: Married    Spouse name: Not on file   Number of children: Not on file   Years of education: Not on file   Highest education level: Not on file  Occupational History   Not on file  Tobacco Use   Smoking status: Former    Packs/day: 0.50    Years: 25.00    Total pack years: 12.50    Types: Cigarettes   Smokeless tobacco: Never  Vaping Use   Vaping Use: Never used  Substance and Sexual Activity   Alcohol use: Not Currently    Alcohol/week: 6.0 standard drinks of alcohol    Types: 6 Cans of beer per week   Drug use: No   Sexual activity: Yes  Other Topics Concern   Not on file  Social History Narrative   Works at [employer]. In [place]. Quit smoking 2021 June. Social. Alcohol.    Social Determinants of Health   Financial Resource Strain: Not on file  Food Insecurity: Not on file  Transportation Needs: Not on file  Physical Activity: Not on file  Stress: Not on file  Social Connections: Not on file    ECOG Status: 3 - Symptomatic, >50% confined to bed  Review of Systems: A 12 point ROS discussed and pertinent positives are indicated in the HPI above.  All other systems are negative.  Vital Signs: There were no vitals taken for this visit.  No physical examination was performed in lieu of virtual telephone clinic visit.  Imaging: CT AP 03/25/22   Labs:  CBC: Recent Labs    03/26/22 1543 03/27/22 0422 04/02/22 1031 04/23/22 1302  WBC 4.9 6.1 6.3 5.2  HGB 5.0* 5.8* 6.3* 6.7*  HCT 16.8* 19.6* 21.0* 22.9*  PLT 166 187 293 257    COAGS: Recent Labs    03/25/22 1307  INR 1.2  APTT 36    BMP: Recent Labs    03/26/22 0224 03/27/22 0422 04/02/22 1031 04/23/22 1302  NA 140 141  138 135  K 3.9 4.0 3.9 3.7  CL 109 112* 102 103  CO2 '23 23 25 25  '$ GLUCOSE 67* 91 131* 96  BUN '14 10 10 12  '$ CALCIUM 8.5* 8.6* 9.3 9.0  CREATININE 1.06 0.86 0.97 1.00  GFRNONAA >60 >60 >60 >60    LIVER FUNCTION TESTS: Recent Labs    02/14/22 1456 03/25/22 1008 04/02/22 1031 04/23/22 1302  BILITOT 0.5 1.0 0.3 0.4  AST 28 68* 35 37  ALT  $'17 19 20 14  'w$ ALKPHOS 172* 172* 307* 267*  PROT 7.8 7.5 7.3 8.1  ALBUMIN 3.6 3.3* 2.9* 3.0*    TUMOR MARKERS: No results for input(s): "AFPTM", "CEA", "CA199", "CHROMGRNA" in the last 8760 hours.  Assessment and Plan: 56 year old male with advanced stage pancreatic cancer with severe abdominal pain.  Given that pain is primarily localized to the right side of the abdomen, I suspect it is mostly associated with his extensive right lobe liver metastasis.  I am skeptical that celiac neurolysis will relieve this pain.  Therefore, I recommend we first attempt diagnostic celiac nerve block (local anesthetic only) and then pursue ethanol neurolysis soon after if effective.  We spoke in detail about each of these procedures and their risks and benefits.  He is interested in pursuing this.  Plan for CT guided celiac plexus block at [facility] with moderate sedation.    Electronically Signed: Suzette Battiest, MD 05/22/2022, 2:30 PM   I spent a total of  40 Minutes  in virtual telephone clinical consultation, greater than 50% of which was counseling/coordinating care for abdominal pain related to advanced stage pancreatic cancer.

## 2022-05-22 NOTE — Telephone Encounter (Signed)
Hospice nurse [place] called reporting that patient is requesting medicine for increasing his appetite. She reports that he has been vomiting and has nausea, but will call in medicine for that off comfort orders. She also reports tha the has 2+ PE bilateral ankles and is requesting medicine for that also. Please advise

## 2022-05-22 NOTE — Telephone Encounter (Signed)
I spoke with hospice nurse.  Edema is likely secondary to hypoalbuminemia in setting of disease progression and poor oral intake.  Recommend conservative measures for edema management including elevating feet and can use compression stockings.  I would not aggressively diurese due to risk of dehydration and falls.  We can trial low-dose dexamethasone, although explained that this could potentially increase fluid retention but may help with appetite, fatigue, and nausea.  Okay to also start ondansetron and prochlorperazine as needed for nausea.  Prescription sent to pharmacy

## 2022-05-23 ENCOUNTER — Inpatient Hospital Stay: Payer: BLUE CROSS/BLUE SHIELD | Attending: Internal Medicine | Admitting: Hospice and Palliative Medicine

## 2022-05-23 ENCOUNTER — Other Ambulatory Visit: Payer: Self-pay | Admitting: Interventional Radiology

## 2022-05-23 DIAGNOSIS — C259 Malignant neoplasm of pancreas, unspecified: Secondary | ICD-10-CM

## 2022-05-23 DIAGNOSIS — C787 Secondary malignant neoplasm of liver and intrahepatic bile duct: Secondary | ICD-10-CM

## 2022-05-23 MED ORDER — OXYCODONE HCL ER 15 MG PO T12A: 15.0000 mg | EXTENDED_RELEASE_TABLET | Freq: Two times a day (BID) | ORAL | 0 refills | Status: DC

## 2022-05-23 NOTE — Progress Notes (Signed)
I called and spoke with patient by phone.  He continues to endorse persistently severe upper abdominal pain.  He is pending celiac plexus block next week with hopes that this will provide therapeutic relief.  In interim, continue oxycodone/OxyContin.  Patient did request refill of OxyContin today.  Rx sent to pharmacy.  I spoke with hospice triage nurse.  Reviewed appointment scheduled with patient for IR procedure.  Patient knows not to drive due to possibility for conscious sedation.  He also verbalized understanding of n.p.o. after midnight.

## 2022-05-23 NOTE — Progress Notes (Signed)
Patient on schedule for Celiac plexus block 10/4, called and spoke with patient on phone with pre procedure instructions given. Made aware to be here at 0830, NPO after MN prior to procedure as well as driver post procedure/recovery/discharge. Stated understanding. Can take heart/pain meds with sip of water.

## 2022-05-26 ENCOUNTER — Other Ambulatory Visit: Payer: Self-pay | Admitting: Hospice and Palliative Medicine

## 2022-05-26 MED ORDER — OXYCODONE HCL 20 MG PO TABS: 1.0000 | ORAL_TABLET | ORAL | 0 refills | Status: DC | PRN

## 2022-05-26 NOTE — H&P (Signed)
Chief Complaint: Patient was seen in consultation today for celiac plexus block   at the request of Suttle,Dylan J  Referring Physician(s): Suzette Battiest  Supervising Physician: Juliet Rude  Patient Status: [place] - Out-pt  History of Present Illness: Dan [name] is a 56 y.o. male with advanced stage pancreatic cancer. Pt was first diagnosed with metastatic pancreatic carcinoma April 2022. He received treatment but declined after 4 cycles due to significant side effects. Pt is currently under hospice care and requires 20 mg oxycodone q 3 hrs d/t severe pain. Pt was referred to IR for celiac plexus block for intractable pain by Altha Harm, NP. Pt had telephone consult with Dr. Serafina Royals 05/22/22. At that time pt decided to proceed with celiac nerve block and possible ethanol neurolysis if nerve block is effective.  Past Medical History:  Diagnosis Date   Acid reflux    Asthma    Cancer associated pain    Diabetes mellitus without complication ([place])    Family history of breast cancer    History of kidney stones    Hypertension    Kidney stones    Pancreas cancer [facility])     Past Surgical History:  Procedure Laterality Date   COLONOSCOPY WITH PROPOFOL N/A 12/02/2019   Procedure: COLONOSCOPY WITH PROPOFOL;  Surgeon: [name], MD;  Location: [facility] ENDOSCOPY;  Service: Gastroenterology;  Laterality: N/A;   ESOPHAGOGASTRODUODENOSCOPY (EGD) WITH PROPOFOL N/A 03/26/2022   Procedure: ESOPHAGOGASTRODUODENOSCOPY (EGD) WITH PROPOFOL;  Surgeon: [name], MD;  Location: [facility] ENDOSCOPY;  Service: Gastroenterology;  Laterality: N/A;   FRACTURE SURGERY Left at age 5   pelvic fracture   HEMORRHOID SURGERY N/A 01/31/2020   Procedure: HEMORRHOIDECTOMY;  Surgeon: Jules Husbands, MD;  Location: [facility] ORS;  Service: General;  Laterality: N/A;   IR IMAGING GUIDED PORT INSERTION  11/30/2020   IR RADIOLOGIST EVAL & MGMT  05/22/2022   IR REMOVAL TUN ACCESS W/ PORT W/O FL MOD SED  02/27/2022   pelvis  fractur     ROTATOR CUFF REPAIR      Allergies: Emend [aprepitant], Cherry, Ibuprofen, Other, and Tramadol  Medications: Prior to Admission medications   Medication Sig Start Date End Date Taking? Authorizing Provider  albuterol (PROVENTIL) (2.5 MG/3ML) 0.083% nebulizer solution Take by nebulization. 01/31/21   [provider]  albuterol (VENTOLIN HFA) 108 (90 Base) MCG/ACT inhaler Inhale into the lungs. 12/20/20   [provider]  amitriptyline (ELAVIL) 10 MG tablet Take 10 mg by mouth at bedtime. 01/14/22   [provider]  amLODipine (NORVASC) 10 MG tablet Take 1 tablet (10 mg total) by mouth daily. 12/16/21   [name], MD  dexamethasone (DECADRON) 2 MG tablet Take 1 tablet (2 mg total) by mouth daily. 05/22/22   Borders, Kirt Boys, NP  FLOVENT HFA 110 MCG/ACT inhaler Inhale 1 puff into the lungs 2 (two) times daily. 12/28/20   [provider]  JARDIANCE 10 MG TABS tablet Take 1 tablet (10 mg total) by mouth daily. 12/12/21   Borders, Kirt Boys, NP  losartan (COZAAR) 100 MG tablet Take 1 tablet (100 mg total) by mouth daily. 10/14/21   Vigg, Avanti, MD  omeprazole (PRILOSEC) 20 MG capsule Take 20 mg by mouth 2 (two) times daily. 05/21/21   [provider]  ondansetron (ZOFRAN) 8 MG tablet Take 1 tablet (8 mg total) by mouth every 8 (eight) hours as needed for nausea or vomiting. 05/22/22   Borders, Kirt Boys, NP  oxyCODONE (OXYCONTIN) 15  mg 12 hr tablet Take 1 tablet (15 mg total) by mouth every 12 (twelve) hours. 05/23/22   Borders, Kirt Boys, NP  Oxycodone HCl 20 MG TABS Take 1 tablet (20 mg total) by mouth every 3 (three) hours as needed (breakthrough pain). 05/26/22   Borders, Kirt Boys, NP  prochlorperazine (COMPAZINE) 10 MG tablet Take 1 tablet (10 mg total) by mouth every 6 (six) hours as needed for nausea or vomiting. 05/22/22   Borders, Kirt Boys, NP  sildenafil (VIAGRA) 100 MG tablet Take 100 mg by mouth as directed. 05/29/21   [provider]     Family History  Problem Relation Age of Onset   Alzheimer's disease Mother    Heart attack Father    Cancer Sister        2 sisters 67 and 26- both died of breast cancer   Diabetes Brother    Hypertension Brother    Asthma Son     Social History   Socioeconomic History   Marital status: Married    Spouse name: Not on file   Number of children: Not on file   Years of education: Not on file   Highest education level: Not on file  Occupational History   Not on file  Tobacco Use   Smoking status: Some Days    Packs/day: 0.50    Years: 25.00    Total pack years: 12.50    Types: Cigarettes   Smokeless tobacco: Never  Vaping Use   Vaping Use: Never used  Substance and Sexual Activity   Alcohol use: Not Currently    Alcohol/week: 6.0 standard drinks of alcohol    Types: 6 Cans of beer per week   Drug use: No   Sexual activity: Yes  Other Topics Concern   Not on file  Social History Narrative   Works at [employer]. In [place]. Quit smoking 2021 June. Social. Alcohol.    Social Determinants of Health   Financial Resource Strain: Not on file  Food Insecurity: Not on file  Transportation Needs: Not on file  Physical Activity: Not on file  Stress: Not on file  Social Connections: Not on file     Review of Systems: A 12 point ROS discussed and pertinent positives are indicated in the HPI above.  All other systems are negative.  Review of Systems  Constitutional:  Positive for appetite change. Negative for chills and fever.  Respiratory:  Negative for shortness of breath.   Cardiovascular:  Negative for chest pain and leg swelling.  Gastrointestinal:  Positive for abdominal pain.    Vital Signs: BP (!) 129/98   Pulse 99   Temp 98.2 F (36.8 C) (Oral)   Resp 18   Ht 6' (1.829 m)   Wt 170 lb (77.1 kg)   SpO2 97%   BMI 23.06 kg/m     Physical Exam Vitals reviewed.  Constitutional:      General: He is not in acute distress.    Appearance: He  is ill-appearing.  HENT:     Head: Normocephalic and atraumatic.     Mouth/Throat:     Mouth: Mucous membranes are dry.     Pharynx: Oropharynx is clear.  Eyes:     Extraocular Movements: Extraocular movements intact.     Pupils: Pupils are equal, round, and reactive to light.  Cardiovascular:     Rate and Rhythm: Normal rate and regular rhythm.     Pulses: Normal pulses.     Heart sounds:  Normal heart sounds.  Pulmonary:     Effort: Pulmonary effort is normal.     Breath sounds: Normal breath sounds.  Abdominal:     General: Bowel sounds are normal.     Palpations: Abdomen is soft.  Musculoskeletal:     Right lower leg: No edema.     Left lower leg: No edema.  Skin:    General: Skin is warm and dry.  Neurological:     Mental Status: He is alert and oriented to person, place, and time.  Psychiatric:        Mood and Affect: Mood normal.        Behavior: Behavior normal.        Thought Content: Thought content normal.        Judgment: Judgment normal.     Imaging: IR Radiologist Eval & Mgmt  Result Date: 05/22/2022 EXAM: NEW PATIENT OFFICE VISIT CHIEF COMPLAINT: See epic note. HISTORY OF PRESENT ILLNESS: See epic note. REVIEW OF SYSTEMS: See epic note. PHYSICAL EXAMINATION: See epic note. ASSESSMENT AND PLAN: See epic note. Ruthann Cancer, MD [facility] Radiology Electronically Signed   By: Ruthann Cancer M.D.   On: 05/22/2022 15:51    Labs:  CBC: Recent Labs    03/26/22 1543 03/27/22 0422 04/02/22 1031 04/23/22 1302  WBC 4.9 6.1 6.3 5.2  HGB 5.0* 5.8* 6.3* 6.7*  HCT 16.8* 19.6* 21.0* 22.9*  PLT 166 187 293 257    COAGS: Recent Labs    03/25/22 1307  INR 1.2  APTT 36    BMP: Recent Labs    03/26/22 0224 03/27/22 0422 04/02/22 1031 04/23/22 1302  NA 140 141 138 135  K 3.9 4.0 3.9 3.7  CL 109 112* 102 103  CO2 '23 23 25 25  '$ GLUCOSE 67* 91 131* 96  BUN '14 10 10 12  '$ CALCIUM 8.5* 8.6* 9.3 9.0   CREATININE 1.06 0.86 0.97 1.00  GFRNONAA >60 >60 >60 >60    LIVER FUNCTION TESTS: Recent Labs    02/14/22 1456 03/25/22 1008 04/02/22 1031 04/23/22 1302  BILITOT 0.5 1.0 0.3 0.4  AST 28 68* 35 37  ALT '17 19 20 14  '$ ALKPHOS 172* 172* 307* 267*  PROT 7.8 7.5 7.3 8.1  ALBUMIN 3.6 3.3* 2.9* 3.0*    TUMOR MARKERS: No results for input(s): "AFPTM", "CEA", "CA199", "CHROMGRNA" in the last 8760 hours.  Assessment and Plan: 56 yo male with pancreatic cancer and intractable pain presents for CT guided celiac plexus block with moderate sedation.  Pt resting on stretcher watching TV.  He is A&O, calm and pleasant.  He is in no distress.  Pt is NPO per order.  Pt reports 5/10 pain.  Risks and benefits discussed with the patient including bleeding, infection, damage to adjacent structures.  All of the patient's questions were answered, patient is agreeable to proceed.  Consent signed and in chart.   Thank you for this interesting consult.  I greatly enjoyed meeting Dan [name] and look forward to participating in their care.  A copy of this report was sent to the requesting provider on this date.  Electronically Signed: Tyson Alias, NP 05/28/2022, 10:15 AM   I spent a total of 20 minutes in face to face in clinical consultation, greater than 50% of which was counseling/coordinating care for intractable pain.

## 2022-05-26 NOTE — Progress Notes (Signed)
Hospice nurse, Kerrie Buffalo, requested refill of oxycodone IR.

## 2022-05-27 ENCOUNTER — Other Ambulatory Visit (HOSPITAL_COMMUNITY): Payer: Self-pay | Admitting: Physician Assistant

## 2022-05-28 ENCOUNTER — Other Ambulatory Visit: Payer: Self-pay

## 2022-05-28 ENCOUNTER — Ambulatory Visit
Admission: RE | Admit: 2022-05-28 | Discharge: 2022-05-28 | Disposition: A | Discharge status: Home / Self Care | Payer: BLUE CROSS/BLUE SHIELD | Source: Ambulatory Visit | Attending: Interventional Radiology | Admitting: Interventional Radiology

## 2022-05-28 DIAGNOSIS — C259 Malignant neoplasm of pancreas, unspecified: Secondary | ICD-10-CM | POA: Insufficient documentation

## 2022-05-28 DIAGNOSIS — Z79891 Long term (current) use of opiate analgesic: Secondary | ICD-10-CM | POA: Diagnosis not present

## 2022-05-28 DIAGNOSIS — R109 Unspecified abdominal pain: Secondary | ICD-10-CM | POA: Diagnosis not present

## 2022-05-28 MED ORDER — MIDAZOLAM HCL 2 MG/2ML IJ SOLN
INTRAMUSCULAR | Status: AC | PRN
  Administered 2022-05-28 (×2): 1 mg via INTRAVENOUS
  Administered 2022-05-28: 2 mg via INTRAVENOUS

## 2022-05-28 MED ORDER — IOHEXOL 180 MG/ML  SOLN: 1.0000 mL | Freq: Once | INTRAMUSCULAR | Status: DC | PRN

## 2022-05-28 MED ORDER — BUPIVACAINE HCL (PF) 0.5 % IJ SOLN
INTRAMUSCULAR | Status: AC
  Filled 2022-05-28: qty 30

## 2022-05-28 MED ORDER — FENTANYL CITRATE (PF) 100 MCG/2ML IJ SOLN
INTRAMUSCULAR | Status: AC | PRN
  Administered 2022-05-28 (×4): 50 ug via INTRAVENOUS

## 2022-05-28 MED ORDER — SODIUM CHLORIDE 0.9 % IV SOLN: INTRAVENOUS | Status: DC

## 2022-05-28 MED ORDER — FENTANYL CITRATE (PF) 100 MCG/2ML IJ SOLN
INTRAMUSCULAR | Status: AC
  Filled 2022-05-28: qty 4

## 2022-05-28 MED ORDER — MIDAZOLAM HCL 2 MG/2ML IJ SOLN
INTRAMUSCULAR | Status: AC
  Filled 2022-05-28: qty 4

## 2022-05-28 NOTE — Procedures (Addendum)
Interventional Radiology Procedure Note  Date of Procedure: 05/28/2022  Procedure: CT celiac plexus nerve root block   Findings:  1. Successful CT celiac nerve root block and neurolysis using dehydrated EtOH via posterior approach    Complications: No immediate complications noted.   Estimated Blood Loss: minimal  Follow-up and Recommendations: 1. Bedrest 3 hours  2. Monitor BP, abdominal symptoms    Albin Felling, MD  Vascular & Interventional Radiology  05/28/2022 12:58 PM

## 2022-06-05 ENCOUNTER — Other Ambulatory Visit: Payer: Self-pay | Admitting: Hospice and Palliative Medicine

## 2022-06-05 MED ORDER — OXYCODONE HCL 20 MG PO TABS: 1.0000 | ORAL_TABLET | ORAL | 0 refills | Status: AC | PRN

## 2022-06-05 NOTE — Progress Notes (Signed)
Hospice nurse, Kerrie Buffalo, requested refill of oxycodone. Rx sent to pharmacy.

## 2022-06-06 ENCOUNTER — Other Ambulatory Visit: Payer: Self-pay | Admitting: Interventional Radiology

## 2022-06-06 DIAGNOSIS — C250 Malignant neoplasm of head of pancreas: Secondary | ICD-10-CM

## 2022-06-07 ENCOUNTER — Inpatient Hospital Stay
Admission: EM | Admit: 2022-06-07 | Discharge: 2022-06-08 | DRG: 948 | Disposition: A | Discharge status: Hospice | Payer: BLUE CROSS/BLUE SHIELD | Attending: Internal Medicine | Admitting: Internal Medicine

## 2022-06-07 ENCOUNTER — Other Ambulatory Visit: Payer: Self-pay

## 2022-06-07 ENCOUNTER — Emergency Department: Payer: BLUE CROSS/BLUE SHIELD

## 2022-06-07 DIAGNOSIS — Z885 Allergy status to narcotic agent status: Secondary | ICD-10-CM

## 2022-06-07 DIAGNOSIS — K219 Gastro-esophageal reflux disease without esophagitis: Secondary | ICD-10-CM | POA: Diagnosis present

## 2022-06-07 DIAGNOSIS — Z1152 Encounter for screening for COVID-19: Secondary | ICD-10-CM

## 2022-06-07 DIAGNOSIS — Z87442 Personal history of urinary calculi: Secondary | ICD-10-CM

## 2022-06-07 DIAGNOSIS — G893 Neoplasm related pain (acute) (chronic): Principal | ICD-10-CM | POA: Diagnosis present

## 2022-06-07 DIAGNOSIS — Z66 Do not resuscitate: Secondary | ICD-10-CM | POA: Diagnosis present

## 2022-06-07 DIAGNOSIS — C787 Secondary malignant neoplasm of liver and intrahepatic bile duct: Secondary | ICD-10-CM

## 2022-06-07 DIAGNOSIS — T402X5A Adverse effect of other opioids, initial encounter: Secondary | ICD-10-CM | POA: Diagnosis present

## 2022-06-07 DIAGNOSIS — K5903 Drug induced constipation: Secondary | ICD-10-CM | POA: Diagnosis present

## 2022-06-07 DIAGNOSIS — Z886 Allergy status to analgesic agent status: Secondary | ICD-10-CM

## 2022-06-07 DIAGNOSIS — C259 Malignant neoplasm of pancreas, unspecified: Secondary | ICD-10-CM

## 2022-06-07 DIAGNOSIS — F17210 Nicotine dependence, cigarettes, uncomplicated: Secondary | ICD-10-CM | POA: Diagnosis present

## 2022-06-07 DIAGNOSIS — Z888 Allergy status to other drugs, medicaments and biological substances status: Secondary | ICD-10-CM

## 2022-06-07 DIAGNOSIS — Z803 Family history of malignant neoplasm of breast: Secondary | ICD-10-CM

## 2022-06-07 DIAGNOSIS — Z833 Family history of diabetes mellitus: Secondary | ICD-10-CM

## 2022-06-07 DIAGNOSIS — Z825 Family history of asthma and other chronic lower respiratory diseases: Secondary | ICD-10-CM

## 2022-06-07 DIAGNOSIS — Z9109 Other allergy status, other than to drugs and biological substances: Secondary | ICD-10-CM

## 2022-06-07 DIAGNOSIS — Z820 Family history of epilepsy and other diseases of the nervous system: Secondary | ICD-10-CM

## 2022-06-07 DIAGNOSIS — Z79899 Other long term (current) drug therapy: Secondary | ICD-10-CM

## 2022-06-07 DIAGNOSIS — R52 Pain, unspecified: Secondary | ICD-10-CM | POA: Insufficient documentation

## 2022-06-07 DIAGNOSIS — Z515 Encounter for palliative care: Secondary | ICD-10-CM

## 2022-06-07 DIAGNOSIS — Z7984 Long term (current) use of oral hypoglycemic drugs: Secondary | ICD-10-CM

## 2022-06-07 DIAGNOSIS — E11649 Type 2 diabetes mellitus with hypoglycemia without coma: Secondary | ICD-10-CM | POA: Diagnosis present

## 2022-06-07 DIAGNOSIS — I10 Essential (primary) hypertension: Secondary | ICD-10-CM | POA: Diagnosis present

## 2022-06-07 DIAGNOSIS — Z79891 Long term (current) use of opiate analgesic: Secondary | ICD-10-CM

## 2022-06-07 DIAGNOSIS — J45909 Unspecified asthma, uncomplicated: Secondary | ICD-10-CM | POA: Diagnosis present

## 2022-06-07 DIAGNOSIS — Z8249 Family history of ischemic heart disease and other diseases of the circulatory system: Secondary | ICD-10-CM

## 2022-06-07 DIAGNOSIS — E860 Dehydration: Secondary | ICD-10-CM | POA: Diagnosis present

## 2022-06-07 LAB — URINALYSIS, ROUTINE W REFLEX MICROSCOPIC
Bacteria, UA: NONE SEEN
Bilirubin Urine: NEGATIVE
Glucose, UA: >=500 mg/dL — AB
Hgb urine dipstick: NEGATIVE
Ketones, ur: NEGATIVE mg/dL
Leukocytes,Ua: NEGATIVE
Nitrite: NEGATIVE
Protein, ur: 30 mg/dL — AB
Specific Gravity, Urine: 1.023 (ref 1.005–1.030)
pH: 8 (ref 5.0–8.0)

## 2022-06-07 LAB — COMPREHENSIVE METABOLIC PANEL
ALT: 25 U/L (ref 0–44)
AST: 59 U/L — ABNORMAL HIGH (ref 15–41)
Albumin: 3.1 g/dL — ABNORMAL LOW (ref 3.5–5.0)
Alkaline Phosphatase: 244 U/L — ABNORMAL HIGH (ref 38–126)
Anion gap: 8 (ref 5–15)
BUN: 16 mg/dL (ref 6–20)
CO2: 26 mmol/L (ref 22–32)
Calcium: 9.3 mg/dL (ref 8.9–10.3)
Chloride: 103 mmol/L (ref 98–111)
Creatinine, Ser: 1.05 mg/dL (ref 0.61–1.24)
GFR, Estimated: >60 mL/min (ref >60)
Glucose, Bld: 68 mg/dL — ABNORMAL LOW (ref 70–99)
Potassium: 4.3 mmol/L (ref 3.5–5.1)
Sodium: 137 mmol/L (ref 135–145)
Total Bilirubin: 1 mg/dL (ref 0.3–1.2)
Total Protein: 7.7 g/dL (ref 6.5–8.1)

## 2022-06-07 LAB — CBC
HCT: 31 % — ABNORMAL LOW (ref 39.0–52.0)
Hemoglobin: 9 g/dL — ABNORMAL LOW (ref 13.0–17.0)
MCH: 21.9 pg — ABNORMAL LOW (ref 26.0–34.0)
MCHC: 29 g/dL — ABNORMAL LOW (ref 30.0–36.0)
MCV: 75.4 fL — ABNORMAL LOW (ref 80.0–100.0)
Platelets: 258 10*3/uL (ref 150–400)
RBC: 4.11 MIL/uL — ABNORMAL LOW (ref 4.22–5.81)
RDW: 18.1 % — ABNORMAL HIGH (ref 11.5–15.5)
WBC: 6.1 10*3/uL (ref 4.0–10.5)
nRBC: 0 % (ref 0.0–0.2)

## 2022-06-07 LAB — LIPASE, BLOOD: Lipase: 39 U/L (ref 11–51)

## 2022-06-07 MED ORDER — OXYCODONE HCL ER 15 MG PO T12A
15.0000 mg | EXTENDED_RELEASE_TABLET | Freq: Two times a day (BID) | ORAL | Status: DC
  Administered 2022-06-07: 15 mg via ORAL
  Filled 2022-06-07: qty 1

## 2022-06-07 MED ORDER — ONDANSETRON HCL 4 MG PO TABS: 4.0000 mg | ORAL_TABLET | Freq: Four times a day (QID) | ORAL | Status: DC | PRN

## 2022-06-07 MED ORDER — ONDANSETRON HCL 4 MG/2ML IJ SOLN
4.0000 mg | Freq: Once | INTRAMUSCULAR | Status: AC
  Administered 2022-06-07: 4 mg via INTRAVENOUS
  Filled 2022-06-07: qty 2

## 2022-06-07 MED ORDER — LACTATED RINGERS IV BOLUS
1000.0000 mL | Freq: Once | INTRAVENOUS | Status: AC
  Administered 2022-06-07: 1000 mL via INTRAVENOUS

## 2022-06-07 MED ORDER — FLUTICASONE PROPIONATE HFA 110 MCG/ACT IN AERO: 1.0000 | INHALATION_SPRAY | Freq: Two times a day (BID) | RESPIRATORY_TRACT | Status: DC

## 2022-06-07 MED ORDER — HYDROMORPHONE HCL 1 MG/ML IJ SOLN
1.0000 mg | INTRAMUSCULAR | Status: DC | PRN
  Administered 2022-06-07 – 2022-06-08 (×6): 1 mg via INTRAVENOUS
  Filled 2022-06-07 (×6): qty 1

## 2022-06-07 MED ORDER — BUDESONIDE 0.25 MG/2ML IN SUSP
0.2500 mg | Freq: Two times a day (BID) | RESPIRATORY_TRACT | Status: DC
  Administered 2022-06-07 – 2022-06-08 (×2): 0.25 mg via RESPIRATORY_TRACT
  Filled 2022-06-07 (×2): qty 2

## 2022-06-07 MED ORDER — POLYETHYLENE GLYCOL 3350 17 G PO PACK
17.0000 g | PACK | Freq: Every day | ORAL | Status: DC
  Administered 2022-06-08: 17 g via ORAL
  Filled 2022-06-07: qty 1

## 2022-06-07 MED ORDER — DEXAMETHASONE 4 MG PO TABS
2.0000 mg | ORAL_TABLET | Freq: Every day | ORAL | Status: DC
  Administered 2022-06-07 – 2022-06-08 (×2): 2 mg via ORAL
  Filled 2022-06-07 (×2): qty 0.5

## 2022-06-07 MED ORDER — SENNA 8.6 MG PO TABS
2.0000 | ORAL_TABLET | Freq: Every day | ORAL | Status: DC
  Administered 2022-06-07: 17.2 mg via ORAL
  Filled 2022-06-07: qty 2

## 2022-06-07 MED ORDER — PANTOPRAZOLE SODIUM 40 MG PO TBEC
40.0000 mg | DELAYED_RELEASE_TABLET | Freq: Every day | ORAL | Status: DC
  Administered 2022-06-07 – 2022-06-08 (×2): 40 mg via ORAL
  Filled 2022-06-07 (×2): qty 1

## 2022-06-07 MED ORDER — HYDROMORPHONE HCL 1 MG/ML IJ SOLN
1.0000 mg | Freq: Once | INTRAMUSCULAR | Status: AC
  Administered 2022-06-07: 1 mg via INTRAVENOUS
  Filled 2022-06-07: qty 1

## 2022-06-07 MED ORDER — IOHEXOL 300 MG/ML  SOLN
100.0000 mL | Freq: Once | INTRAMUSCULAR | Status: AC | PRN
  Administered 2022-06-07: 100 mL via INTRAVENOUS

## 2022-06-07 MED ORDER — DEXTROSE 10 % IV BOLUS
250.0000 mL | Freq: Once | INTRAVENOUS | Status: AC
  Administered 2022-06-07: 250 mL via INTRAVENOUS

## 2022-06-07 MED ORDER — HYDROMORPHONE HCL 1 MG/ML IJ SOLN
1.5000 mg | Freq: Once | INTRAMUSCULAR | Status: AC
  Administered 2022-06-07: 1.5 mg via INTRAVENOUS
  Filled 2022-06-07: qty 1.5

## 2022-06-07 MED ORDER — ALBUTEROL SULFATE (2.5 MG/3ML) 0.083% IN NEBU: 2.5000 mg | INHALATION_SOLUTION | Freq: Four times a day (QID) | RESPIRATORY_TRACT | Status: DC | PRN

## 2022-06-07 MED ORDER — DEXTROSE-NACL 5-0.9 % IV SOLN: INTRAVENOUS | Status: DC

## 2022-06-07 MED ORDER — ONDANSETRON HCL 4 MG/2ML IJ SOLN: 4.0000 mg | Freq: Four times a day (QID) | INTRAMUSCULAR | Status: DC | PRN

## 2022-06-07 MED ORDER — POLYETHYLENE GLYCOL 3350 17 G PO PACK: 17.0000 g | PACK | Freq: Every day | ORAL | Status: DC

## 2022-06-07 MED ORDER — ENOXAPARIN SODIUM 40 MG/0.4ML IJ SOSY
40.0000 mg | PREFILLED_SYRINGE | INTRAMUSCULAR | Status: DC
  Administered 2022-06-07: 40 mg via SUBCUTANEOUS

## 2022-06-07 MED ORDER — AMLODIPINE BESYLATE 5 MG PO TABS
10.0000 mg | ORAL_TABLET | Freq: Every day | ORAL | Status: DC
  Administered 2022-06-07: 10 mg via ORAL
  Filled 2022-06-07 (×3): qty 2

## 2022-06-07 NOTE — TOC Initial Note (Signed)
Transition of Care [facility]) - Initial/Assessment Note    Patient Details  Name: Dan [name] MRN: [id number] Date of Birth: 03/13/1966  [facility] of [facility]) CM/SW Contact:    [address], [place] Phone Number: 06/07/2022, 12:21 PM  Clinical Narrative:                  CSW confirmed pt is active with [facility], made them aware pt is in the ED and per MD will be admitted due to uncontrolled pain, liaisons will be made aware and communicate with MD.         Patient Goals and CMS Choice        Expected Discharge Plan and Services                                                Prior Living Arrangements/Services                       Activities of Daily Living      Permission Sought/Granted                  Emotional Assessment              Admission diagnosis:  Uncontrolled pain [R52] Patient Active Problem List   Diagnosis Date Noted   Uncontrolled pain 06/07/2022   Symptomatic anemia 04/02/2022   GI bleeding 03/25/2022   Depression 03/25/2022   Blood transfusion declined because patient is Jehovah's Witness 03/25/2022   Acute blood loss anemia 03/25/2022   Acute upper GI bleed    Malignant neoplasm of pancreas ([place]) 10/14/2021   Genetic testing 04/04/2021   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 ([address]), [place] - [place]) [place] [address] Phone: [phone] Fax: [phone]  [facility] [place] [place] [facility] [address] [place]  [place] [address] Phone: [phone] Fax: [phone]  [facility] [place] [place] [address] Phone: [phone] Fax: [phone]  [place] [address], [place] - [place] [address] Phone: [phone] Fax: [phone]  [id number] [place] [place] [address] Phone: [phone] Fax: [phone]     Social Determinants of Health ([place]) Interventions    Readmission Risk Interventions     No data to display

## 2022-06-07 NOTE — ED Provider Notes (Signed)
[facility] Provider Note    Event Date/Time   First MD Initiated Contact with Patient 06/07/22 [id number]     (approximate)   History   Abdominal Pain   HPI  Dan [name] is a 56 y.o. 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  Pain started about a week ago.  He has chronic pain related to his cancer which is primarily in the upper abdomen but over the last week he has had increasing right lower quadrant pain.  It is constant worse with movement.  He has had nausea and vomiting and has not had a bowel movement in 1 week.  Typically goes every other day.  He takes oxycodone and OxyContin for his chronic cancer pain which is not relieving this pain.  Denies any fevers or chills.  Does feel mildly short of breath.  Also endorses lower extremity edema.  Last week he had a celiac nerve block with interventional radiology which does not seem to have helped his pain.     Past Medical History:  Diagnosis Date   Acid reflux    Asthma    Cancer associated pain    Diabetes mellitus without complication ([place])    Family history of breast cancer    History of kidney stones    Hypertension    Kidney stones    Pancreas cancer [facility])     Patient Active Problem List   Diagnosis Date Noted   Uncontrolled pain 06/07/2022   Symptomatic anemia 04/02/2022   GI bleeding 03/25/2022   Depression 03/25/2022   Blood transfusion declined because patient is Jehovah's Witness 03/25/2022   Acute blood loss anemia 03/25/2022   Acute upper GI bleed    Malignant neoplasm of pancreas ([place]) 10/14/2021   Genetic testing 04/04/2021   BRCA1 gene mutation positive 04/04/2021   Family history of breast cancer 03/14/2021   Pancreatic cancer metastasized to liver ([place]) 11/23/2020   Pancreatic mass 11/19/2020   Hypokalemia 11/19/2020   Flank pain 11/14/2019   Asthma    Acid reflux    Type 2 diabetes  mellitus without complication ([place]) 00/17/4944   HTN (hypertension) 08/17/2013     Physical Exam  Triage Vital Signs: ED Triage Vitals  Enc Vitals Group     BP 06/07/22 0745 (!) 142/108     Pulse Rate 06/07/22 0745 (!) 109     Resp 06/07/22 0745 18     Temp 06/07/22 0749 98.4 F (36.9 C)     Temp Source 06/07/22 0749 Oral     SpO2 06/07/22 0745 94 %     Weight 06/07/22 0746 155 lb (70.3 kg)     Height 06/07/22 0746 6' (1.829 m)     Head Circumference --      Peak Flow --      Pain Score 06/07/22 0746 10     Pain Loc --      Pain Edu? --      Excl. in [place]? --     Most recent vital signs: Vitals:   06/07/22 0749 06/07/22 1114  BP:  (!) 137/90  Pulse:  99  Resp:  16  Temp: 98.4 F (36.9 C)   SpO2:  94%     General: Awake, no distress.  Patient is cachectic and chronically ill-appearing CV:  Good peripheral perfusion.  2+ Edema in the lower extremities Resp:  Normal effort.  No increased work  of breathing Abd:  Abdomen is distended and tight, significant right lower quadrant tenderness with guarding Neuro:             Awake, Alert, Oriented x 3  Other:     ED Results / Procedures / Treatments  Labs (all labs ordered are listed, but only abnormal results are displayed) Labs Reviewed  COMPREHENSIVE METABOLIC PANEL - Abnormal; Notable for the following components:      Result Value   Glucose, Bld 68 (*)    Albumin 3.1 (*)    AST 59 (*)    Alkaline Phosphatase 244 (*)    All other components within normal limits  CBC - Abnormal; Notable for the following components:   RBC 4.11 (*)    Hemoglobin 9.0 (*)    HCT 31.0 (*)    MCV 75.4 (*)    MCH 21.9 (*)    MCHC 29.0 (*)    RDW 18.1 (*)    All other components within normal limits  LIPASE, BLOOD  URINALYSIS, ROUTINE W REFLEX MICROSCOPIC     EKG  Reviewed and interpreted by myself shows sinus tachycardia with normal axis, normal intervals.  LVH T wave inversion in lead V2   RADIOLOGY CT abdomen pelvis  reviewed interpreted myself is negative for bowel obstruction shows large liver mets and pancreatic lesion   PROCEDURES:  Critical Care performed: No  Procedures    MEDICATIONS ORDERED IN ED: Medications  HYDROmorphone (DILAUDID) injection 1 mg (1 mg Intravenous Given 06/07/22 0856)  ondansetron (ZOFRAN) injection 4 mg (4 mg Intravenous Given 06/07/22 0856)  dextrose (D10W) 10% bolus 250 mL (250 mLs Intravenous New Bag/Given 06/07/22 0857)  lactated ringers bolus 1,000 mL (0 mLs Intravenous Stopped 06/07/22 1110)  iohexol (OMNIPAQUE) 300 MG/ML solution 100 mL (100 mLs Intravenous Contrast Given 06/07/22 0901)  HYDROmorphone (DILAUDID) injection 1.5 mg (1.5 mg Intravenous Given 06/07/22 1109)  lactated ringers bolus 1,000 mL (1,000 mLs Intravenous New Bag/Given 06/07/22 1109)     IMPRESSION / MDM / ASSESSMENT AND PLAN / ED COURSE  I reviewed the triage vital signs and the nursing notes.                              Patient's presentation is most consistent with acute presentation with potential threat to life or bodily function.  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  Symptoms been going on for about a week.  His pain is primarily in the right lower quadrant is worse with movement and is not relieved by his home opiates.  He did have a celiac plexus block performed by IR about a week ago.  Patient is tachycardic but his rest of his vitals are reassuring he looks chronically ill and cachectic.  His abdomen is distended tight and quite tender primarily in the right lower quadrant with guarding.  Labs are notable for anemia hemoglobin of 9 elevated alkaline phosphatase.  Concern for SBO.  Given patient is on hospice I did discuss what work-up and  treatment he would like and he is agreeable to obtain a CT of the abdomen pelvis to further evaluate for SBO fluids and pain control.  CT abdomen pelvis has no new acute findings.  Is noted that there are large liver mets  that have increased in size as well as his primary pancreatic mass is also increased in size.  Patient had minimal relief from milligram of Dilaudid.  We will give 1.5 mg now.  Discussed with patient his wishes to be admitted for additional pain control versus go home.  Patient does prefer admission because does not feel that he will be able to do well at home with this degree of pain.  Will discuss with the hospitalist. Clinical Course as of 06/07/22 1159  Sat Jun 07, 2022  0806 [phone] for [place] or [phone] [KM]    Clinical Course User Index [KM] Rada Hay, MD     FINAL CLINICAL IMPRESSION(S) / ED DIAGNOSES   Final diagnoses:  Malignant neoplasm of pancreas, unspecified location of malignancy [facility])     Rx / DC Orders   ED Discharge Orders     None        Note:  This document was prepared using Dragon voice recognition software and may include unintentional dictation errors.   Rada Hay, MD 06/07/22 1159

## 2022-06-07 NOTE — Assessment & Plan Note (Signed)
IV fluid hydration with dextrose.  Patient can eat if he wants.

## 2022-06-07 NOTE — Assessment & Plan Note (Addendum)
With patient being in a lot of pain this morning I gave an IV Dilaudid dose.  Ordered IV Dilaudid every hour.  Restarted oxycodone.  Increased OxyContin to 30 mg twice a day.  I ordered a PCA pump for when he gets to the floor.  Spoke with hospice liaison who will evaluate the patient.  Patient is a DNR.

## 2022-06-07 NOTE — ED Triage Notes (Addendum)
Pt states he has been having RLQ pain x1 week- pt states that he also has not been eating the past couple days, feels dehydrated, and feels weak- pt has a hospice nurse that comes to see him and he told her about it- pt states his ankles are also swollen

## 2022-06-07 NOTE — Assessment & Plan Note (Signed)
Soapsuds enema now Place patient on MiraLAX and senna

## 2022-06-07 NOTE — Assessment & Plan Note (Signed)
Discontinue Norvasc.

## 2022-06-07 NOTE — ED Notes (Signed)
Pt states he takes his oxy q8 at home. Requesting that be changed here. Floor coverage notified.

## 2022-06-07 NOTE — Assessment & Plan Note (Signed)
Treatment as outlined in 1 

## 2022-06-07 NOTE — H&P (Addendum)
History and Physical    Patient: Dan [name] [id number]:[id number] DOB: 12/02/1965 DOA: 06/07/2022 DOS: the patient was seen and examined on 06/07/2022 PCP: Charlynne Cousins, MD (Inactive)  Patient coming from: Home  Chief Complaint:  Chief Complaint  Patient presents with   Abdominal Pain   HPI: Dan [name] is a 56 y.o. male with medical history significant for pancreatic acinar cell carcinoma stage IV with liver mets, BRCA1 mutated who is on home hospice, history of diabetes mellitus, hypertension who presents to the ER today for evaluation of worsening abdominal pain which he rated 10 x 10 in intensity at its worst.  Pain is diffuse and associated with abdominal distention.  Patient has not had any relief despite taking his oral opioids at home.  He also complains of a 1 week history of constipation, nausea and anorexia.  He complains of worsening weakness. He denies having any fever, no chills, no headache, no dizziness, no lightheadedness, no urinary symptoms, no blurred vision ,no focal deficit. Labs show a blood sugar of 68 He will be referred to observation status for pain control   Review of Systems: As mentioned in the history of present illness. All other systems reviewed and are negative. Past Medical History:  Diagnosis Date   Acid reflux    Asthma    Cancer associated pain    Diabetes mellitus without complication ([place])    Family history of breast cancer    History of kidney stones    Hypertension    Kidney stones    Pancreas cancer [facility])    Past Surgical History:  Procedure Laterality Date   COLONOSCOPY WITH PROPOFOL N/A 12/02/2019   Procedure: COLONOSCOPY WITH PROPOFOL;  Surgeon: [name], MD;  Location[facility] ENDOSCOPY;  Service: Gastroenterology;  Laterality: N/A;   ESOPHAGOGASTRODUODENOSCOPY (EGD) WITH PROPOFOL N/A 03/26/2022   Procedure: ESOPHAGOGASTRODUODENOSCOPY (EGD) WITH PROPOFOL;  Surgeon: [name], MD;  Location: [facility] ENDOSCOPY;  Service: Gastroenterology;   Laterality: N/A;   FRACTURE SURGERY Left at age 54   pelvic fracture   HEMORRHOID SURGERY N/A 01/31/2020   Procedure: HEMORRHOIDECTOMY;  Surgeon: Jules Husbands, MD;  Location: [facility] ORS;  Service: General;  Laterality: N/A;   IR IMAGING GUIDED PORT INSERTION  11/30/2020   IR RADIOLOGIST EVAL & MGMT  05/22/2022   IR REMOVAL TUN ACCESS W/ PORT W/O FL MOD SED  02/27/2022   pelvis fractur     ROTATOR CUFF REPAIR     Social History:  reports that he has been smoking cigarettes. He has a 12.50 pack-year smoking history. He has never used smokeless tobacco. He reports that he does not currently use alcohol after a past usage of about 6.0 standard drinks of alcohol per week. He reports that he does not use drugs.  Allergies  Allergen Reactions   Emend [Aprepitant] Other (See Comments)    Pt c/o severe pain in abdomen / back / chest    Cherry Nausea And Vomiting   Ibuprofen Nausea And Vomiting   Other Nausea And Vomiting    olives   Tramadol Other (See Comments)    Headaches.     Family History  Problem Relation Age of Onset   Alzheimer's disease Mother    Heart attack Father    Cancer Sister        2 sisters 80 and 76- both died of breast cancer   Diabetes Brother    Hypertension Brother    Asthma Son     Prior to Admission medications  Medication Sig Start Date End Date Taking? Authorizing Provider  albuterol (PROVENTIL) (2.5 MG/3ML) 0.083% nebulizer solution Take by nebulization. 01/31/21   [provider]  albuterol (VENTOLIN HFA) 108 (90 Base) MCG/ACT inhaler Inhale into the lungs. 12/20/20   [provider]  amitriptyline (ELAVIL) 10 MG tablet Take 10 mg by mouth at bedtime. Patient not taking: Reported on 05/28/2022 01/14/22   [provider]  amLODipine (NORVASC) 10 MG tablet Take 1 tablet (10 mg total) by mouth daily. 12/16/21   [name], MD  dexamethasone (DECADRON) 2 MG tablet Take 1 tablet (2 mg total) by mouth daily. 05/22/22   Borders, Kirt Boys, NP  FLOVENT HFA 110 MCG/ACT inhaler Inhale 1 puff into the lungs 2 (two) times daily. 12/28/20   [provider]  JARDIANCE 10 MG TABS tablet Take 1 tablet (10 mg total) by mouth daily. 12/12/21   Borders, Kirt Boys, NP  losartan (COZAAR) 100 MG tablet Take 1 tablet (100 mg total) by mouth daily. 10/14/21   Vigg, Avanti, MD  omeprazole (PRILOSEC) 20 MG capsule Take 20 mg by mouth 2 (two) times daily. 05/21/21   [provider]  ondansetron (ZOFRAN) 8 MG tablet Take 1 tablet (8 mg total) by mouth every 8 (eight) hours as needed for nausea or vomiting. 05/22/22   Borders, Kirt Boys, NP  oxyCODONE (OXYCONTIN) 15 mg 12 hr tablet Take 1 tablet (15 mg total) by mouth every 12 (twelve) hours. 05/23/22   Borders, Kirt Boys, NP  Oxycodone HCl 20 MG TABS Take 1 tablet (20 mg total) by mouth every 3 (three) hours as needed (breakthrough pain). 06/05/22   Borders, Kirt Boys, NP  prochlorperazine (COMPAZINE) 10 MG tablet Take 1 tablet (10 mg total) by mouth every 6 (six) hours as needed for nausea or vomiting. 05/22/22   Borders, Kirt Boys, NP  sildenafil (VIAGRA) 100 MG tablet Take 100 mg by mouth as directed. 05/29/21   [provider]    Physical Exam: Vitals:   06/07/22 0745 06/07/22 0746 06/07/22 0749 06/07/22 1114  BP: (!) 142/108   (!) 137/90  Pulse: (!) 109   99  Resp: 18   16  Temp:   98.4 F (36.9 C)   TempSrc:   Oral   SpO2: 94%   94%  Weight:  70.3 kg    Height:  6' (1.829 m)     Physical Exam Vitals and nursing note reviewed.  Constitutional:      Comments: Chronically ill-appearing  HENT:     Head: Normocephalic and atraumatic.     Comments: Pale conjunctiva Cardiovascular:     Rate and Rhythm: Normal rate and regular rhythm.  Pulmonary:     Effort: Pulmonary effort is normal.     Breath sounds: Normal breath sounds.  Abdominal:     General: There is distension.     Comments: Abdomen is distended, firm and diffusely tender  Skin:    General: Skin is warm and  dry.  Neurological:     General: No focal deficit present.     Mental Status: He is alert.  Psychiatric:        Mood and Affect: Mood normal.        Behavior: Behavior normal.     Data Reviewed: Relevant notes from primary care and specialist visits, past discharge summaries as available in EHR, including Care Everywhere. Prior diagnostic testing as pertinent to current admission diagnoses Updated medications and problem lists for reconciliation ED course,  including vitals, labs, imaging, treatment and response to treatment Triage notes, nursing and pharmacy notes and ED provider's notes Notable results as noted in HPI Labs reviewed.  White count 6.1, hemoglobin 9, hematocrit 31, platelet count 258, lipase 39, sodium 137, potassium 4.3, chloride 103, bicarb 26, glucose 68, BUN 16, creatinine 1.05, calcium 9.3, total protein 7.7, albumin 3.1, AST 59, ALT 25, alkaline phosphatase 244, total bilirubin 1.0 CT scan of abdomen and pelvis showed Large conglomerate complex cystic and solid masses/liver metastasis occupying the entire right lobe liver with the largest lesion measuring 20.7 x 16 cm enlarged compared to prior exam. 2 large cystic and solid masses at the pancreatic tail/body enlarged compared to prior exam. This is consistent with patient's known pancreatic cancer. Previously noted borderline left retroperitoneal lymph nodes are persistent and unchanged. Aortic atherosclerosis. Twelve-lead EKG reviewed by me shows sinus tachycardia with LVH There are no new results to review at this time.  Assessment and Plan: * Pancreatic cancer metastasized to liver [facility]) Patient has a history of stage IV pancreatic cancer with mets to the liver and is currently on hospice at home. He presents to the ER for evaluation of poorly controlled cancer pain despite taking his oral opioids at home. Continue scheduled oxycodone Place patient on as needed hydromorphone He would likely benefit from a fentanyl  patch upon discharge [facility] consult Consult oncology   Cancer associated pain Treatment as outlined in 1  HTN (hypertension) Continue amlodipine  Constipation due to opioid therapy Soapsuds enema now Place patient on MiraLAX and senna  Hypoglycemia associated with diabetes ([place]) IV fluid hydration with dextrose Liberalize oral intake Place patient on a regular diet      Advance Care Planning:   Code Status: DNR   Consults:  TOC  Family Communication: Greater than 50% of time was spent discussing patient's condition and plan of care with him at the bedside.  All questions and concerns have been addressed.  He verbalizes understanding and agrees with the plan.  He would like to go back home with hospice. CODE STATUS was discussed and he wishes to be a DNR.  Severity of Illness: The appropriate patient status for this patient is OBSERVATION. Observation status is judged to be reasonable and necessary in order to provide the required intensity of service to ensure the patient's safety. The patient's presenting symptoms, physical exam findings, and initial radiographic and laboratory data in the context of their medical condition is felt to place them at decreased risk for further clinical deterioration. Furthermore, it is anticipated that the patient will be medically stable for discharge from the hospital within 2 midnights of admission.   Author: Collier Bullock, MD 06/07/2022 12:44 PM  For on call review [email or web address].

## 2022-06-07 NOTE — ED Notes (Signed)
Pt assisted to bedside commode

## 2022-06-07 NOTE — Progress Notes (Signed)
[facility] ED [address] [facility])      Hospice hospital liaison note  This patient is a current hospice patient with [facility], admitted 9.5.23 with a terminal diagnosis of metastatic cancer of the pancreas.     [facility] will continue to follow for any discharge planning needs and to coordinate continuation of hospice care.   Please don't hesitate to call with any Hospice related questions or concerns.    Thank you for the opportunity to participate in this patient's care. Jhonnie Garner, Therapist, sports, BSN, [facility] Hospital Liaison [phone]

## 2022-06-08 DIAGNOSIS — Z825 Family history of asthma and other chronic lower respiratory diseases: Secondary | ICD-10-CM | POA: Diagnosis not present

## 2022-06-08 DIAGNOSIS — E860 Dehydration: Secondary | ICD-10-CM | POA: Diagnosis present

## 2022-06-08 DIAGNOSIS — J45909 Unspecified asthma, uncomplicated: Secondary | ICD-10-CM | POA: Diagnosis present

## 2022-06-08 DIAGNOSIS — Z515 Encounter for palliative care: Secondary | ICD-10-CM | POA: Diagnosis not present

## 2022-06-08 DIAGNOSIS — K219 Gastro-esophageal reflux disease without esophagitis: Secondary | ICD-10-CM | POA: Diagnosis present

## 2022-06-08 DIAGNOSIS — Z886 Allergy status to analgesic agent status: Secondary | ICD-10-CM | POA: Diagnosis not present

## 2022-06-08 DIAGNOSIS — G893 Neoplasm related pain (acute) (chronic): Secondary | ICD-10-CM | POA: Diagnosis present

## 2022-06-08 DIAGNOSIS — Z8249 Family history of ischemic heart disease and other diseases of the circulatory system: Secondary | ICD-10-CM | POA: Diagnosis not present

## 2022-06-08 DIAGNOSIS — T402X5A Adverse effect of other opioids, initial encounter: Secondary | ICD-10-CM

## 2022-06-08 DIAGNOSIS — E11649 Type 2 diabetes mellitus with hypoglycemia without coma: Secondary | ICD-10-CM

## 2022-06-08 DIAGNOSIS — K5903 Drug induced constipation: Secondary | ICD-10-CM

## 2022-06-08 DIAGNOSIS — I10 Essential (primary) hypertension: Secondary | ICD-10-CM | POA: Diagnosis present

## 2022-06-08 DIAGNOSIS — Z66 Do not resuscitate: Secondary | ICD-10-CM | POA: Diagnosis present

## 2022-06-08 DIAGNOSIS — F17210 Nicotine dependence, cigarettes, uncomplicated: Secondary | ICD-10-CM | POA: Diagnosis present

## 2022-06-08 DIAGNOSIS — C259 Malignant neoplasm of pancreas, unspecified: Secondary | ICD-10-CM | POA: Diagnosis present

## 2022-06-08 DIAGNOSIS — Z833 Family history of diabetes mellitus: Secondary | ICD-10-CM | POA: Diagnosis not present

## 2022-06-08 DIAGNOSIS — Z820 Family history of epilepsy and other diseases of the nervous system: Secondary | ICD-10-CM | POA: Diagnosis not present

## 2022-06-08 DIAGNOSIS — Z888 Allergy status to other drugs, medicaments and biological substances status: Secondary | ICD-10-CM | POA: Diagnosis not present

## 2022-06-08 DIAGNOSIS — Z87442 Personal history of urinary calculi: Secondary | ICD-10-CM | POA: Diagnosis not present

## 2022-06-08 DIAGNOSIS — Z1152 Encounter for screening for COVID-19: Secondary | ICD-10-CM | POA: Diagnosis not present

## 2022-06-08 DIAGNOSIS — C787 Secondary malignant neoplasm of liver and intrahepatic bile duct: Secondary | ICD-10-CM | POA: Diagnosis present

## 2022-06-08 DIAGNOSIS — Z803 Family history of malignant neoplasm of breast: Secondary | ICD-10-CM | POA: Diagnosis not present

## 2022-06-08 DIAGNOSIS — Z885 Allergy status to narcotic agent status: Secondary | ICD-10-CM | POA: Diagnosis not present

## 2022-06-08 DIAGNOSIS — Z9109 Other allergy status, other than to drugs and biological substances: Secondary | ICD-10-CM | POA: Diagnosis not present

## 2022-06-08 MED ORDER — HYDROMORPHONE HCL 1 MG/ML IJ SOLN
1.0000 mg | Freq: Once | INTRAMUSCULAR | Status: AC
  Administered 2022-06-08: 1 mg via INTRAVENOUS
  Filled 2022-06-08: qty 1

## 2022-06-08 MED ORDER — GLYCOPYRROLATE 0.2 MG/ML IJ SOLN: 0.2000 mg | INTRAMUSCULAR | Status: DC | PRN

## 2022-06-08 MED ORDER — GLYCOPYRROLATE 0.2 MG/ML IJ SOLN: 0.2000 mg | INTRAMUSCULAR | Status: AC | PRN

## 2022-06-08 MED ORDER — POLYETHYLENE GLYCOL 3350 17 G PO PACK: 17.0000 g | PACK | Freq: Every day | ORAL | 0 refills | Status: AC

## 2022-06-08 MED ORDER — SODIUM CHLORIDE 0.9% FLUSH: 9.0000 mL | INTRAVENOUS | Status: DC | PRN

## 2022-06-08 MED ORDER — LORAZEPAM 2 MG/ML PO CONC: 1.0000 mg | ORAL | Status: DC | PRN

## 2022-06-08 MED ORDER — OXYCODONE HCL ER 30 MG PO T12A: 30.0000 mg | EXTENDED_RELEASE_TABLET | Freq: Two times a day (BID) | ORAL | 0 refills | Status: AC

## 2022-06-08 MED ORDER — LORAZEPAM 1 MG PO TABS: 1.0000 mg | ORAL_TABLET | ORAL | Status: DC | PRN

## 2022-06-08 MED ORDER — HYDROMORPHONE HCL 1 MG/ML IJ SOLN: 1.0000 mg | INTRAMUSCULAR | Status: DC | PRN

## 2022-06-08 MED ORDER — ONDANSETRON HCL 4 MG/2ML IJ SOLN: 4.0000 mg | Freq: Four times a day (QID) | INTRAMUSCULAR | Status: DC | PRN

## 2022-06-08 MED ORDER — ACETAMINOPHEN 650 MG RE SUPP: 650.0000 mg | Freq: Four times a day (QID) | RECTAL | Status: DC | PRN

## 2022-06-08 MED ORDER — OXYCODONE HCL 5 MG PO TABS: 20.0000 mg | ORAL_TABLET | ORAL | Status: DC | PRN

## 2022-06-08 MED ORDER — ONDANSETRON 4 MG PO TBDP: 4.0000 mg | ORAL_TABLET | Freq: Four times a day (QID) | ORAL | Status: DC | PRN

## 2022-06-08 MED ORDER — BIOTENE DRY MOUTH MT LIQD: 15.0000 mL | OROMUCOSAL | Status: DC | PRN

## 2022-06-08 MED ORDER — LORAZEPAM 2 MG/ML IJ SOLN: 1.0000 mg | INTRAMUSCULAR | Status: DC | PRN

## 2022-06-08 MED ORDER — HALOPERIDOL LACTATE 5 MG/ML IJ SOLN: 0.5000 mg | INTRAMUSCULAR | Status: DC | PRN

## 2022-06-08 MED ORDER — POLYVINYL ALCOHOL 1.4 % OP SOLN: 1.0000 [drp] | Freq: Four times a day (QID) | OPHTHALMIC | Status: DC | PRN

## 2022-06-08 MED ORDER — LORAZEPAM 2 MG/ML IJ SOLN: 1.0000 mg | INTRAMUSCULAR | 0 refills | Status: AC | PRN

## 2022-06-08 MED ORDER — HYDROMORPHONE HCL 1 MG/ML IJ SOLN
1.0000 mg | INTRAMUSCULAR | Status: DC | PRN
  Administered 2022-06-08 (×4): 1 mg via INTRAVENOUS
  Filled 2022-06-08 (×3): qty 1

## 2022-06-08 MED ORDER — HALOPERIDOL LACTATE 5 MG/ML IJ SOLN: 0.5000 mg | INTRAMUSCULAR | Status: AC | PRN

## 2022-06-08 MED ORDER — ALBUTEROL SULFATE (2.5 MG/3ML) 0.083% IN NEBU: 2.5000 mg | INHALATION_SOLUTION | Freq: Four times a day (QID) | RESPIRATORY_TRACT | 12 refills | Status: AC | PRN

## 2022-06-08 MED ORDER — ACETAMINOPHEN 325 MG PO TABS: 650.0000 mg | ORAL_TABLET | Freq: Four times a day (QID) | ORAL | Status: DC | PRN

## 2022-06-08 MED ORDER — ONDANSETRON HCL 4 MG/2ML IJ SOLN: 4.0000 mg | Freq: Four times a day (QID) | INTRAMUSCULAR | 0 refills | Status: AC | PRN

## 2022-06-08 MED ORDER — HYDROMORPHONE HCL 1 MG/ML IJ SOLN: 1.0000 mg | INTRAMUSCULAR | 0 refills | Status: AC | PRN

## 2022-06-08 MED ORDER — HALOPERIDOL 0.5 MG PO TABS: 0.5000 mg | ORAL_TABLET | ORAL | Status: DC | PRN

## 2022-06-08 MED ORDER — DEXTROSE-NACL 5-0.9 % IV SOLN: 40.0000 mL/h | INTRAVENOUS | Status: AC

## 2022-06-08 MED ORDER — HYDROMORPHONE 1 MG/ML IV SOLN
INTRAVENOUS | Status: DC
  Filled 2022-06-08 (×4): qty 30

## 2022-06-08 MED ORDER — HALOPERIDOL LACTATE 2 MG/ML PO CONC: 0.5000 mg | ORAL | Status: DC | PRN

## 2022-06-08 MED ORDER — HYDROMORPHONE HCL 1 MG/ML IJ SOLN
INTRAMUSCULAR | Status: AC
  Filled 2022-06-08: qty 1

## 2022-06-08 MED ORDER — GLYCOPYRROLATE 1 MG PO TABS: 1.0000 mg | ORAL_TABLET | ORAL | Status: DC | PRN

## 2022-06-08 MED ORDER — POLYVINYL ALCOHOL 1.4 % OP SOLN: 1.0000 [drp] | Freq: Four times a day (QID) | OPHTHALMIC | 0 refills | Status: AC | PRN

## 2022-06-08 MED ORDER — SENNA 8.6 MG PO TABS: 2.0000 | ORAL_TABLET | Freq: Every day | ORAL | 0 refills | Status: AC

## 2022-06-08 MED ORDER — OXYCODONE HCL ER 15 MG PO T12A
30.0000 mg | EXTENDED_RELEASE_TABLET | Freq: Two times a day (BID) | ORAL | Status: DC
  Administered 2022-06-08: 30 mg via ORAL
  Filled 2022-06-08: qty 2

## 2022-06-08 MED ORDER — BIOTENE DRY MOUTH MT LIQD: 15.0000 mL | OROMUCOSAL | Status: AC | PRN

## 2022-06-08 MED ORDER — NALOXONE HCL 0.4 MG/ML IJ SOLN: 0.4000 mg | INTRAMUSCULAR | Status: DC | PRN

## 2022-06-08 NOTE — Progress Notes (Signed)
MD order received in [facility] to discharge pt to the [facility] today; [facility] previously called EMS for nonemergency transport; discharge packet prepared for EMS personnel to take to the facility; report called to [phone], spoke to [place]; pt's discharge pending arrival of EMS for nonemergency transport

## 2022-06-08 NOTE — TOC Transition Note (Signed)
Transition of Care [facility]) - CM/SW Discharge Note   Patient Details  Name: Dan [name] MRN: [id number] Date of Birth: [birth year]  Transition of Care [facility]) CM/SW Contact:  Loreta [address], [place] Phone Number: 06/08/2022, 1:45 PM   Clinical Narrative:     [facility]: [facility] Anticipated DC date: 06/08/22 Family notified: Yes, spouse Psychiatric nurse byJohnanna Schneiders   Per MD patient ready for DC to [facility]. RN to call report prior to discharge [phone]. RN, patient, patient's family, and facility notified of DC. DC packet on chart. Ambulance transport requested for patient.   CSW will sign off for now as social work intervention is no longer needed. Please consult [place] again if new needs arise.          Patient Goals and CMS Choice        Discharge Placement                       Discharge Plan and Services                                     Social Determinants of Health (SDOH) Interventions     Readmission Risk Interventions     No data to display

## 2022-06-08 NOTE — Progress Notes (Signed)
Progress Note   Patient: Dan [name] [id number]:[id number] DOB: [birth year] DOA: 06/07/2022     0 DOS: the patient was seen and examined on 06/08/2022   Assessment and Plan: * Pancreatic cancer metastasized to liver [facility]) With patient being in a lot of pain this morning I gave an IV Dilaudid dose.  Ordered IV Dilaudid every hour.  Restarted oxycodone.  Increased OxyContin to 30 mg twice a day.  I ordered a PCA pump for when he gets to the floor.  Spoke with hospice liaison who will evaluate the patient.  Patient is a DNR.   Cancer associated pain IV Dilaudid every hour until PCA pump obtained.  HTN (hypertension) Discontinue Norvasc  Constipation due to opioid therapy MiraLAX and senna  Hypoglycemia associated with diabetes ([place]) IV fluid hydration with dextrose.  Patient can eat if he wants.        Subjective: I was notified this morning patient had quite a bit of pain.  I ordered for an extra dose of IV Dilaudid and went to evaluate him.  I changed IV Dilaudid to every 1 hour and ordered a PCA pump (this can't be started in the ER).  After the IV Dilaudid was given he felt a little bit better.  Patient did not feel like eating.  He was up all night and could not sleep secondary to pain.  Physical Exam: Vitals:   06/08/22 0840 06/08/22 0856 06/08/22 1023 06/08/22 1221  BP:  (!) 131/100 (!) 123/96   Pulse: 85 90 84   Resp:  18 16   Temp:    98.1 F (36.7 C)  TempSrc:    Oral  SpO2: 99% 98% 96%   Weight:      Height:       Physical Exam HENT:     Head: Normocephalic.     Mouth/Throat:     Pharynx: No oropharyngeal exudate.  Eyes:     General: Lids are normal.     Conjunctiva/sclera: Conjunctivae normal.  Cardiovascular:     Rate and Rhythm: Normal rate and regular rhythm.     Heart sounds: Normal heart sounds, S1 normal and S2 normal.  Pulmonary:     Breath sounds: Examination of the right-lower field reveals decreased breath sounds. Examination of the left-lower field  reveals decreased breath sounds. Decreased breath sounds present. No wheezing, rhonchi or rales.  Abdominal:     General: There is distension.     Palpations: Abdomen is soft.     Tenderness: There is generalized abdominal tenderness and tenderness in the right upper quadrant.  Musculoskeletal:     Right lower leg: Swelling present.     Left lower leg: Swelling present.  Skin:    General: Skin is warm.     Findings: No rash.  Neurological:     Mental Status: He is alert and oriented to person, place, and time.     Data Reviewed: Alkaline phosphatase 244, creatinine 1.05, hemoglobin 9.0. CT scan of the abdomen and pelvis showed a large conglomerate complex cystic and solid masses in the liver with metastases.  Largest measuring 20.7 x 16 cm.  Enlarged since prior exam.  2 large cystic and solid masses at the pancreatic tail body which also has enlarged since prior exam.  Retroperitoneal lymph nodes.  Family Communication: Spoke with patient's wife on the phone  Disposition: Status is: Observation Started on PCA pump secondary to severe cancer pain.  Notified hospice liaison. Planned Discharge Destination: Depending on clinical course  may be a candidate for the hospice facility.    Time spent: 30 minutes  Author: [name], MD 06/08/2022 12:40 PM  For on call review [email or web address].

## 2022-06-08 NOTE — Progress Notes (Signed)
Received pt from the ED at 1325 to Room 130; Dr Leslye Peer entered an order at 1327 to discharge the pt to the [facility]; unable to complete the admission process due to discharge orders

## 2022-06-08 NOTE — ED Notes (Signed)
Advised nurse that patient has ready bed 

## 2022-06-08 NOTE — Discharge Summary (Signed)
Physician Discharge Summary   Patient: Dan [name] MRN: [id number] DOB: [birth year]  Admit date:     06/07/2022  Discharge date to hospice home 06/08/22  Discharge Physician: [name]   PCP: Charlynne Cousins, MD (Inactive)   Recommendations at discharge:   Follow-up team at hospice home when very  Discharge Diagnoses: Principal Problem:   Pancreatic cancer metastasized to liver [facility]) Active Problems:   Cancer associated pain   HTN (hypertension)   Hypoglycemia associated with diabetes ([place])   Constipation due to opioid therapy   Hospice care patient    Hospital Course: Patient was admitted to the hospital on 06/07/2022 and discharged to the hospice home on 06/08/2022.  Patient came in with worsening pain.  CT scan of the abdomen pelvis showed increasing masses in the liver and pancreas.  I started Dilaudid IV every hour this morning and was going to plan on starting a PCA pump once he got up to the floor.  Hospice liaison was okay with continuing Dilaudid pushes for now and then starting Dilaudid drip if needed over in the hospice home.  Patient will be discharged to the hospice home.  Patient is a DNR.  Assessment and Plan: * Pancreatic cancer metastasized to liver [facility]) With patient being in a lot of pain this morning I gave an IV Dilaudid dose.  Ordered IV Dilaudid every hour.  Restarted oxycodone.  Increased OxyContin to 30 mg twice a day.  I ordered a PCA pump for when he gets to the floor.  Spoke with hospice liaison who will evaluate the patient.  Patient is a DNR.   Cancer associated pain IV Dilaudid every hour as needed  HTN (hypertension) Discontinue Norvasc  Constipation due to opioid therapy MiraLAX and senna  Hypoglycemia associated with diabetes ([place]) IV fluid hydration with dextrose.  Patient can eat if he wants.         Consultants: Hospice liaison Procedures performed: None Disposition: Hospice facility Diet recommendation:  Regular as  tolerated DISCHARGE MEDICATION: Allergies as of 06/08/2022       Reactions   Emend [aprepitant] Other (See Comments)   Pt c/o severe pain in abdomen / back / chest    Cherry Nausea And Vomiting   Ibuprofen Nausea And Vomiting   Other Nausea And Vomiting   olives   Tramadol Other (See Comments)   Headaches.         Medication List     STOP taking these medications    amLODipine 10 MG tablet Commonly known as: NORVASC   azithromycin 250 MG tablet Commonly known as: ZITHROMAX   Flovent HFA 110 MCG/ACT inhaler Generic drug: fluticasone   Jardiance 10 MG Tabs tablet Generic drug: empagliflozin   losartan 100 MG tablet Commonly known as: COZAAR   ondansetron 8 MG tablet Commonly known as: ZOFRAN Replaced by: ondansetron 4 MG/2ML Soln injection   prochlorperazine 10 MG tablet Commonly known as: COMPAZINE   sildenafil 100 MG tablet Commonly known as: VIAGRA       TAKE these medications    albuterol (2.5 MG/3ML) 0.083% nebulizer solution Commonly known as: PROVENTIL Take 3 mLs (2.5 mg total) by nebulization every 6 (six) hours as needed for wheezing or shortness of breath. What changed:  how much to take when to take this reasons to take this Another medication with the same name was removed. Continue taking this medication, and follow the directions you see here.   amitriptyline 10 MG tablet Commonly known as: ELAVIL Take 10  mg by mouth at bedtime.   antiseptic oral rinse Liqd Apply 15 mLs topically as needed for dry mouth.   dexamethasone 2 MG tablet Commonly known as: DECADRON Take 1 tablet (2 mg total) by mouth daily.   dextrose 5 % and 0.9% NaCl 5-0.9 % infusion Inject 40 mL/hr into the vein continuous.   glycopyrrolate 0.2 MG/ML injection Commonly known as: ROBINUL Inject 1 mL (0.2 mg total) into the vein every 4 (four) hours as needed (excessive secretions).   haloperidol lactate 5 MG/ML injection Commonly known as: HALDOL Inject 0.1 mLs  (0.5 mg total) into the vein every 4 (four) hours as needed (or delirium).   HYDROmorphone 1 MG/ML injection Commonly known as: DILAUDID Inject 1 mL (1 mg total) into the vein every hour as needed for severe pain.   LORazepam 2 MG/ML injection Commonly known as: ATIVAN Inject 0.5 mLs (1 mg total) into the vein every 4 (four) hours as needed for anxiety.   omeprazole 20 MG capsule Commonly known as: PRILOSEC Take 20 mg by mouth 2 (two) times daily.   ondansetron 4 MG/2ML Soln injection Commonly known as: ZOFRAN Inject 2 mLs (4 mg total) into the vein every 6 (six) hours as needed for nausea. Replaces: ondansetron 8 MG tablet   Oxycodone HCl 20 MG Tabs Take 1 tablet (20 mg total) by mouth every 3 (three) hours as needed (breakthrough pain). What changed: Another medication with the same name was changed. Make sure you understand how and when to take each.   oxyCODONE 30 MG 12 hr tablet Take 1 tablet (30 mg total) by mouth every 12 (twelve) hours. What changed:  medication strength how much to take   polyethylene glycol 17 g packet Commonly known as: MIRALAX / GLYCOLAX Take 17 g by mouth daily. Start taking on: June 09, 2022   polyvinyl alcohol 1.4 % ophthalmic solution Commonly known as: LIQUIFILM TEARS Place 1 drop into both eyes 4 (four) times daily as needed for dry eyes.   senna 8.6 MG Tabs tablet Commonly known as: SENOKOT Take 2 tablets (17.2 mg total) by mouth at bedtime.        Discharge Exam: Filed Weights   06/07/22 0746  Weight: 70.3 kg    Condition at discharge: serious  The results of significant diagnostics from this hospitalization (including imaging, microbiology, ancillary and laboratory) are listed below for reference.   Imaging Studies: CT ABDOMEN PELVIS W CONTRAST  Result Date: 06/07/2022 CLINICAL DATA:  Right lower quadrant abdomen pain for 1 week. EXAM: CT ABDOMEN AND PELVIS WITH CONTRAST TECHNIQUE: Multidetector CT imaging of the  abdomen and pelvis was performed using the standard protocol following bolus administration of intravenous contrast. RADIATION DOSE REDUCTION: This exam was performed according to the departmental dose-optimization program which includes automated exposure control, adjustment of the mA and/or kV according to patient size and/or use of iterative reconstruction technique. CONTRAST:  147m OMNIPAQUE IOHEXOL 300 MG/ML  SOLN COMPARISON:  March 25, 2022 FINDINGS: Lower chest: Patchy consolidation of medial right lung base is identified. The left lung is clear. The heart size is normal. Hepatobiliary: Large conglomerate complex cystic and solid masses occupying the entire right lobe liver with the largest lesion measuring 20.7 x 16 cm enlarged compared to prior exam. The gallbladder and biliary tree are normal. Pancreas: 2 large cystic and solid masses at the pancreatic tail/body enlarged compared to prior exam. Larger lesion measures 20 x 11.5 cm. Smaller lesion measures 14.2 x 10.2 cm Spleen: Normal  in size.  No focal lesion. Adrenals/Urinary Tract: 1.8 cm left adrenal lesion likely metastatic disease is stable. The right adrenal gland is normal. No focal kidney lesion is identified. Bladder is normal. Stomach/Bowel: The stomach is severely compressed by the pancreatic masses. There is no small bowel obstruction colon appears grossly normal. Vascular/Lymphatic: Aortic atherosclerosis. Previously noted borderline left retroperitoneal lymph nodes are persistent and unchanged. Reproductive: Prostate calcifications are identified in normal size prostate gland. Other: Minimal ascites is identified in the pelvis. Musculoskeletal: Mixed sclerotic changes of the L5 vertebral body is stable. IMPRESSION: 1. Large conglomerate complex cystic and solid masses/liver metastasis occupying the entire right lobe liver with the largest lesion measuring 20.7 x 16 cm enlarged compared to prior exam. 2. 2 large cystic and solid masses at the  pancreatic tail/body enlarged compared to prior exam. This is consistent with patient's known pancreatic cancer 3. Previously noted borderline left retroperitoneal lymph nodes are persistent and unchanged. 4. Aortic atherosclerosis. Aortic Atherosclerosis (ICD10-I70.0). Electronically Signed   By: Abelardo Diesel M.D.   On: 06/07/2022 09:42   CT GUIDED NEEDLE PLACEMENT  Result Date: 05/28/2022 INDICATION: Pancreatic cancer, abdominal pain EXAM: Celiac plexus nerve root block and neurolysis using CT guidance COMPARISON:  None MEDICATIONS: Contrast material: Omnipaque 180 1 mL Dehydrated alcohol 20 mL ANESTHESIA/SEDATION: Moderate (conscious) sedation was employed during this procedure. A total of Versed 4 mg and Fentanyl 50 mcg was administered intravenously. Moderate Sedation Time: 77 minutes. The patient's level of consciousness and vital signs were monitored continuously by radiology nursing throughout the procedure under my direct supervision. FLUOROSCOPY: N/a COMPLICATIONS: None immediate. TECHNIQUE: Informed written consent was obtained from the patient after a thorough discussion of the procedural risks, benefits and alternatives. All questions were addressed. Maximal Sterile Barrier Technique was utilized including caps, mask, sterile gowns, sterile gloves, sterile drape, hand hygiene and skin antiseptic. A timeout was performed prior to the initiation of the procedure. The patient was placed prone on the CT exam table. Limited CT of the abdomen was performed for planning purposes. A bilateral posterior paravertebral approach was planned for the celiac plexus nerve root block. Skin entry sites were marked, and the overlying skin was prepped and draped in the standard sterile fashion. Local analgesia was obtained with 1% lidocaine. Attention was turned to the right side. Using intermittent CT fluoroscopy, a 22 gauge needle was directed towards the antecrural space just superior to the origin of the celiac  artery. This was then repeated on the left side. The stylets were removed, demonstrating no free return of blood. Needle positioning was then confirmed with injection of dilute contrast material (Omnipaque 180 mixed with normal saline at a ratio of 1:10), demonstrating spread of contrast material along the right and left antecrural spaces. No vascular uptake was identified. After confirming appropriate needle positioning, 5 mL of 0.5% bupivacaine was administered through each access needle for a total of 10 mL. The patient was then observed for a period of 15 minutes, demonstrating stable vital signs and no hypotension. The decision was then made to proceed with administration of neurolytic agent. Subsequently, 10 mL of dehydrated alcohol was then administered through each access needle for a total of 20 mL. The patient tolerated this portion of the procedure well. Repeat imaging of the abdomen demonstrated expected spread of the neurolytic agent along the bilateral antecrural spaces. All needles were removed, and clean dressings were placed. The patient was transferred to the recovery area in stable condition. FINDINGS: As above. IMPRESSION: Successful  CT-guided celiac plexus nerve root block and neurolysis using dehydrated alcohol. Electronically Signed   By: Albin Felling M.D.   On: 05/28/2022 12:49   CT GUIDED NEEDLE PLACEMENT  Result Date: 05/28/2022 INDICATION: Pancreatic cancer, abdominal pain EXAM: Celiac plexus nerve root block and neurolysis using CT guidance COMPARISON:  None MEDICATIONS: Contrast material: Omnipaque 180 1 mL Dehydrated alcohol 20 mL ANESTHESIA/SEDATION: Moderate (conscious) sedation was employed during this procedure. A total of Versed 4 mg and Fentanyl 50 mcg was administered intravenously. Moderate Sedation Time: 77 minutes. The patient's level of consciousness and vital signs were monitored continuously by radiology nursing throughout the procedure under my direct supervision.  FLUOROSCOPY: N/a COMPLICATIONS: None immediate. TECHNIQUE: Informed written consent was obtained from the patient after a thorough discussion of the procedural risks, benefits and alternatives. All questions were addressed. Maximal Sterile Barrier Technique was utilized including caps, mask, sterile gowns, sterile gloves, sterile drape, hand hygiene and skin antiseptic. A timeout was performed prior to the initiation of the procedure. The patient was placed prone on the CT exam table. Limited CT of the abdomen was performed for planning purposes. A bilateral posterior paravertebral approach was planned for the celiac plexus nerve root block. Skin entry sites were marked, and the overlying skin was prepped and draped in the standard sterile fashion. Local analgesia was obtained with 1% lidocaine. Attention was turned to the right side. Using intermittent CT fluoroscopy, a 22 gauge needle was directed towards the antecrural space just superior to the origin of the celiac artery. This was then repeated on the left side. The stylets were removed, demonstrating no free return of blood. Needle positioning was then confirmed with injection of dilute contrast material (Omnipaque 180 mixed with normal saline at a ratio of 1:10), demonstrating spread of contrast material along the right and left antecrural spaces. No vascular uptake was identified. After confirming appropriate needle positioning, 5 mL of 0.5% bupivacaine was administered through each access needle for a total of 10 mL. The patient was then observed for a period of 15 minutes, demonstrating stable vital signs and no hypotension. The decision was then made to proceed with administration of neurolytic agent. Subsequently, 10 mL of dehydrated alcohol was then administered through each access needle for a total of 20 mL. The patient tolerated this portion of the procedure well. Repeat imaging of the abdomen demonstrated expected spread of the neurolytic agent  along the bilateral antecrural spaces. All needles were removed, and clean dressings were placed. The patient was transferred to the recovery area in stable condition. FINDINGS: As above. IMPRESSION: Successful CT-guided celiac plexus nerve root block and neurolysis using dehydrated alcohol. Electronically Signed   By: Albin Felling M.D.   On: 05/28/2022 12:49   IR Radiologist Eval & Mgmt  Result Date: 05/22/2022 EXAM: NEW PATIENT OFFICE VISIT CHIEF COMPLAINT: See epic note. HISTORY OF PRESENT ILLNESS: See epic note. REVIEW OF SYSTEMS: See epic note. PHYSICAL EXAMINATION: See epic note. ASSESSMENT AND PLAN: See epic note. Ruthann Cancer, MD [facility] Electronically Signed   By: Ruthann Cancer M.D.   On: 05/22/2022 15:51    Microbiology: Results for orders placed or performed during the hospital encounter of 03/25/22  SARS Coronavirus 2 by RT PCR (hospital order, performed in [facility] hospital lab) *cepheid single result test* Anterior Nasal Swab     Status: None   Collection Time: 03/25/22  1:07 PM   Specimen: Anterior Nasal Swab  Result Value Ref Range  Status   SARS Coronavirus 2 by RT PCR NEGATIVE NEGATIVE Final    Comment: (NOTE) SARS-CoV-2 target nucleic acids are NOT DETECTED.  The SARS-CoV-2 RNA is generally detectable in upper and lower respiratory specimens during the acute phase of infection. The lowest concentration of SARS-CoV-2 viral copies this assay can detect is 250 copies / mL. A negative result does not preclude SARS-CoV-2 infection and should not be used as the sole basis for treatment or other patient management decisions.  A negative result may occur with improper specimen collection / handling, submission of specimen other than nasopharyngeal swab, presence of viral mutation(s) within the areas targeted by this assay, and inadequate number of viral copies (<250 copies / mL). A negative result must be combined  with clinical observations, patient history, and epidemiological information.  Fact Sheet for Patients:   https://www.patel.info/  Fact Sheet for Healthcare Providers: https://hall.com/  This test is not yet approved or  cleared by the [place] FDA and has been authorized for detection and/or diagnosis of SARS-CoV-2 by FDA under an Emergency Use Authorization (EUA).  This EUA will remain in effect (meaning this test can be used) for the duration of the COVID-19 declaration under Section 564(b)(1) of the Act, 21 U.S.C. section 360bbb-3(b)(1), unless the authorization is terminated or revoked sooner.  Performed at [facility], [address] [place], [place] [address]   Surgical PCR screen     Status: Abnormal   Collection Time: 03/25/22 11:28 PM   Specimen: Nasal Mucosa; Nasal Swab  Result Value Ref Range Status   MRSA, PCR NEGATIVE NEGATIVE Final   Staphylococcus aureus POSITIVE (A) NEGATIVE Final    Comment: (NOTE) The Xpert SA Assay (FDA approved for NASAL specimens in patients 59 years of age and older), is one component of a comprehensive surveillance program. It is not intended to diagnose infection nor to guide or monitor treatment. Performed at [facility], [address] [place], [place] [address]     Labs: CBC: Recent Labs  Lab 06/07/22 0747  WBC 6.1  HGB 9.0*  HCT 31.0*  MCV 75.4*  PLT 076   Basic Metabolic Panel: Recent Labs  Lab 06/07/22 0747  NA 137  K 4.3  CL 103  CO2 26  GLUCOSE 68*  BUN 16  CREATININE 1.05  CALCIUM 9.3   Liver Function Tests: Recent Labs  Lab 06/07/22 0747  AST 59*  ALT 25  ALKPHOS 244*  BILITOT 1.0  PROT 7.7  ALBUMIN 3.1*     Discharge time spent: greater than 30 minutes.  Signed: [name], MD [facility] 06/08/2022

## 2022-06-08 NOTE — ED Notes (Signed)
Provider messaged for more pain meds.

## 2022-06-08 NOTE — Plan of Care (Signed)
  Problem: Education: Goal: Knowledge of General Education information will improve Description: Including pain rating scale, medication(s)/side effects and non-pharmacologic comfort measures Outcome: Adequate for Discharge   Problem: Health Behavior/Discharge Planning: Goal: Ability to manage health-related needs will improve Outcome: Adequate for Discharge   Problem: Clinical Measurements: Goal: Ability to maintain clinical measurements within normal limits will improve Outcome: Adequate for Discharge Goal: Will remain free from infection Outcome: Adequate for Discharge Goal: Diagnostic test results will improve Outcome: Adequate for Discharge Goal: Respiratory complications will improve Outcome: Adequate for Discharge Goal: Cardiovascular complication will be avoided Outcome: Adequate for Discharge   Problem: Activity: Goal: Risk for activity intolerance will decrease Outcome: Adequate for Discharge   Problem: Nutrition: Goal: Adequate nutrition will be maintained Outcome: Adequate for Discharge   Problem: Coping: Goal: Level of anxiety will decrease Outcome: Adequate for Discharge   Problem: Elimination: Goal: Will not experience complications related to bowel motility Outcome: Adequate for Discharge Goal: Will not experience complications related to urinary retention Outcome: Adequate for Discharge   Problem: Pain Managment: Goal: General experience of comfort will improve Outcome: Adequate for Discharge   Problem: Safety: Goal: Ability to remain free from injury will improve Outcome: Adequate for Discharge   Problem: Skin Integrity: Goal: Risk for impaired skin integrity will decrease Outcome: Adequate for Discharge   Problem: Education: Goal: Knowledge of the prescribed therapeutic regimen will improve Outcome: Adequate for Discharge   Problem: Coping: Goal: Ability to identify and develop effective coping behavior will improve Outcome: Adequate for  Discharge   Problem: Clinical Measurements: Goal: Quality of life will improve Outcome: Adequate for Discharge   Problem: Respiratory: Goal: Verbalizations of increased ease of respirations will increase Outcome: Adequate for Discharge   Problem: Role Relationship: Goal: Family's ability to cope with current situation will improve Outcome: Adequate for Discharge Goal: Ability to verbalize concerns, feelings, and thoughts to partner or family member will improve Outcome: Adequate for Discharge   Problem: Pain Management: Goal: Satisfaction with pain management regimen will improve Outcome: Adequate for Discharge   

## 2022-06-08 NOTE — Progress Notes (Signed)
EMS present for pt discharge to the [facility]; discharge packet given to EMS personnel to take to the facility; pt discharged via stretcher by EMS personnel

## 2022-06-08 NOTE — Progress Notes (Signed)
[facility] ED [facility]) Hospice hospital liaison note  Visited with patient and wife at bedside. Wife requesting transfer to hospice home. We will be able to move patient over this afternoon.   Please don't hesitate to reach out with any hospice related questions or concerns.   Thank you  Jhonnie Garner, BSN, RN [phone]

## 2022-06-08 NOTE — ED Notes (Addendum)
This RN at bedside, charge RN, pt's family asking to speak with patient relations. Provided family with pt relations number and states that our patient advocate would be in at 12pm. Asked patient and family if there is anything I could do to assist them. Family refused. Notified Baxter Flattery, primary RN.

## 2022-06-09 ENCOUNTER — Encounter: Payer: Self-pay | Admitting: Internal Medicine

## 2022-06-10 ENCOUNTER — Ambulatory Visit
Admission: RE | Admit: 2022-06-10 | Discharge: 2022-06-10 | Disposition: A | Discharge status: Home / Self Care | Payer: BLUE CROSS/BLUE SHIELD | Source: Ambulatory Visit | Attending: Interventional Radiology | Admitting: Interventional Radiology

## 2022-06-10 DIAGNOSIS — C250 Malignant neoplasm of head of pancreas: Secondary | ICD-10-CM

## 2022-06-10 HISTORY — PX: IR RADIOLOGIST EVAL & MGMT: IMG5224

## 2022-06-10 NOTE — Progress Notes (Signed)
IR brief note   I followed up briefly this morning with the patient's spouse, Mardene Celeste Maker.  The patient had recently underwent CT-guided celiac plexus neurolysis on May 28, 2022.  Unfortunately, it seems that the patient did not experience any significant pain relief as he was recently discharged from the hospital 2 days ago and was transitioned to hospice care.  This is not completely unexpected given the extent of his metastatic disease and tumor burden in the abdomen.  The patient and his family have our contact should they have any further questions.    Albin Felling, MD  Vascular and Interventional Radiology 06/10/2022 8:36 AM

## 2022-06-16 ENCOUNTER — Other Ambulatory Visit: Payer: Self-pay | Admitting: Hospice and Palliative Medicine

## 2022-06-16 MED ORDER — SODIUM CHLORIDE 0.9 % IV SOLN: INTRAVENOUS | 0 refills | Status: AC

## 2022-06-18 ENCOUNTER — Telehealth: Payer: Self-pay | Admitting: *Deleted

## 2022-06-18 NOTE — Telephone Encounter (Signed)
Dan [name] patient Hospice nurse called to report patient death. NOtification was already received from hospice by fax

## 2022-06-25 NOTE — Progress Notes (Signed)
I received a call from hospice nurse, Kerrie Buffalo.  Reportedly, patient went to the hospice home due to intractable pain.  He was started on a hydromorphone infusion at 0.5 mg/h with 0.3 mg every 10 minute bolus. Kerrie Buffalo is requesting a new prescription to refill medication from community pharmacy.  Reportedly, pain is better controlled on this regimen.  I called and spoke with pharmacist at [facility]. Current order is for hydromorphone infusion at 0.8 mg/h and 0.5 mg bolus every 15 minutes.  Rx sent to pharmacy.  Of note, Kerrie Buffalo confirms the patient is a DNR and goal is comfort measures until end of life.

## 2022-06-25 DEATH — deceased
